# Patient Record
Sex: Male | Born: 1985 | Race: Black or African American | Hispanic: No | Marital: Single | Smoking: Former smoker
Health system: Southern US, Community
[De-identification: ages and names within clinical notes are randomized; demographics above are authoritative.]

## PROBLEM LIST (undated history)

## (undated) DIAGNOSIS — F419 Anxiety disorder, unspecified: Secondary | ICD-10-CM

## (undated) DIAGNOSIS — I10 Essential (primary) hypertension: Secondary | ICD-10-CM

## (undated) HISTORY — PX: APPENDECTOMY: SHX54

---
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
## 2009-03-29 ENCOUNTER — Emergency Department (HOSPITAL_COMMUNITY): Admission: EM | Admit: 2009-03-29 | Discharge: 2009-03-29 | Payer: Self-pay | Admitting: Family Medicine

## 2009-11-09 ENCOUNTER — Emergency Department (HOSPITAL_COMMUNITY): Admission: EM | Admit: 2009-11-09 | Discharge: 2009-11-09 | Payer: Self-pay | Admitting: Emergency Medicine

## 2009-12-12 ENCOUNTER — Ambulatory Visit: Payer: Self-pay | Admitting: Psychology

## 2010-01-22 ENCOUNTER — Ambulatory Visit: Payer: Self-pay | Admitting: Psychology

## 2010-01-25 ENCOUNTER — Inpatient Hospital Stay (HOSPITAL_COMMUNITY): Admission: EM | Admit: 2010-01-25 | Discharge: 2010-01-26 | Payer: Self-pay | Admitting: Emergency Medicine

## 2010-01-26 ENCOUNTER — Encounter (INDEPENDENT_AMBULATORY_CARE_PROVIDER_SITE_OTHER): Payer: Self-pay | Admitting: Internal Medicine

## 2010-01-27 ENCOUNTER — Telehealth (INDEPENDENT_AMBULATORY_CARE_PROVIDER_SITE_OTHER): Payer: Self-pay | Admitting: *Deleted

## 2010-01-27 ENCOUNTER — Ambulatory Visit: Payer: Self-pay | Admitting: Psychology

## 2010-01-28 ENCOUNTER — Ambulatory Visit: Payer: Self-pay | Admitting: Internal Medicine

## 2010-01-30 ENCOUNTER — Ambulatory Visit: Payer: Self-pay | Admitting: Internal Medicine

## 2010-01-30 ENCOUNTER — Ambulatory Visit: Payer: Self-pay

## 2010-02-17 ENCOUNTER — Emergency Department (HOSPITAL_COMMUNITY)
Admission: EM | Admit: 2010-02-17 | Discharge: 2010-02-17 | Payer: Self-pay | Source: Home / Self Care | Admitting: Emergency Medicine

## 2010-02-20 ENCOUNTER — Ambulatory Visit: Payer: Self-pay | Admitting: Psychology

## 2010-02-23 ENCOUNTER — Ambulatory Visit (HOSPITAL_COMMUNITY)
Admission: RE | Admit: 2010-02-23 | Discharge: 2010-02-23 | Payer: Self-pay | Source: Home / Self Care | Admitting: Internal Medicine

## 2010-03-18 ENCOUNTER — Ambulatory Visit: Payer: Self-pay | Admitting: Psychology

## 2010-04-16 ENCOUNTER — Ambulatory Visit
Admission: RE | Admit: 2010-04-16 | Discharge: 2010-04-16 | Payer: Self-pay | Source: Home / Self Care | Attending: Psychology | Admitting: Psychology

## 2010-04-23 NOTE — Procedures (Signed)
Summary: Summary Report  Summary Report   Imported By: Erle Crocker 02/11/2010 16:00:26  _____________________________________________________________________  External Attachment:    Type:   Image     Comment:   External Document

## 2010-04-23 NOTE — Progress Notes (Signed)
Summary: 24 holter monitor  Phone Note Outgoing Call Call back at [facility] Phone [phone]   Call placed by: Stanton Kidney, EMT-P,  January 27, 2010 3:08 PM Call placed to: Patient Action Taken: Phone Call Completed, Appt scheduled Summary of Call: S/W Patient, 24 holter monitor scheduled 01/28/10 at 3:30. Stanton Kidney, EMT-P  January 27, 2010 3:08 PM

## 2010-06-02 LAB — DIFFERENTIAL
Basophils Relative: 0 % (ref 0–1)
Eosinophils Absolute: 0.2 10*3/uL (ref 0.0–0.7)
Eosinophils Relative: 5 % (ref 0–5)
Monocytes Absolute: 0.7 10*3/uL (ref 0.1–1.0)
Monocytes Absolute: 0.8 10*3/uL (ref 0.1–1.0)
Monocytes Relative: 10 % (ref 3–12)
Neutro Abs: 4.1 10*3/uL (ref 1.7–7.7)
Neutro Abs: 4.3 10*3/uL (ref 1.7–7.7)
Neutrophils Relative %: 55 % (ref 43–77)

## 2010-06-02 LAB — URINALYSIS, ROUTINE W REFLEX MICROSCOPIC
Glucose, UA: NEGATIVE mg/dL
Ketones, ur: 15 mg/dL — AB
Urobilinogen, UA: 1 mg/dL (ref 0.0–1.0)
pH: 6.5 (ref 5.0–8.0)

## 2010-06-02 LAB — COMPREHENSIVE METABOLIC PANEL
ALT: 22 U/L (ref 0–53)
Albumin: 3.4 g/dL — ABNORMAL LOW (ref 3.5–5.2)
Albumin: 4.1 g/dL (ref 3.5–5.2)
Alkaline Phosphatase: 46 U/L (ref 39–117)
BUN: 7 mg/dL (ref 6–23)
CO2: 27 mEq/L (ref 19–32)
CO2: 28 mEq/L (ref 19–32)
Calcium: 10.2 mg/dL (ref 8.4–10.5)
Calcium: 9.4 mg/dL (ref 8.4–10.5)
Chloride: 105 mEq/L (ref 96–112)
GFR calc Af Amer: >60 mL/min (ref >60)
GFR calc Af Amer: >60 mL/min (ref >60)
GFR calc non Af Amer: >60 mL/min (ref >60)
Glucose, Bld: 111 mg/dL — ABNORMAL HIGH (ref 70–99)
Glucose, Bld: 96 mg/dL (ref 70–99)
Potassium: 4.1 mEq/L (ref 3.5–5.1)
Sodium: 142 mEq/L (ref 135–145)
Total Bilirubin: 0.6 mg/dL (ref 0.3–1.2)

## 2010-06-02 LAB — CBC
HCT: 46.2 % (ref 39.0–52.0)
HCT: 47.1 % (ref 39.0–52.0)
Hemoglobin: 15.7 g/dL (ref 13.0–17.0)
MCH: 28.8 pg (ref 26.0–34.0)
MCH: 29 pg (ref 26.0–34.0)
MCHC: 34.1 g/dL (ref 30.0–36.0)
MCHC: 34.6 g/dL (ref 30.0–36.0)
RBC: 5.41 MIL/uL (ref 4.22–5.81)
RDW: 12.9 % (ref 11.5–15.5)
RDW: 12.9 % (ref 11.5–15.5)
WBC: 7.9 10*3/uL (ref 4.0–10.5)

## 2010-06-02 LAB — BASIC METABOLIC PANEL
BUN: 6 mg/dL (ref 6–23)
CO2: 26 mEq/L (ref 19–32)
Calcium: 10 mg/dL (ref 8.4–10.5)
GFR calc Af Amer: >60 mL/min (ref >60)
GFR calc non Af Amer: >60 mL/min (ref >60)
Potassium: 4 mEq/L (ref 3.5–5.1)
Sodium: 139 mEq/L (ref 135–145)

## 2010-06-02 LAB — RAPID URINE DRUG SCREEN, HOSP PERFORMED
Amphetamines: NOT DETECTED
Barbiturates: NOT DETECTED
Benzodiazepines: NOT DETECTED
Cocaine: NOT DETECTED

## 2010-06-02 LAB — CK TOTAL AND CKMB (NOT AT ARMC)
CK, MB: 1.9 ng/mL (ref 0.3–4.0)
Relative Index: 0.5 (ref 0.0–2.5)
Relative Index: 0.5 (ref 0.0–2.5)
Relative Index: 0.5 (ref 0.0–2.5)
Total CK: 206 U/L (ref 7–232)
Total CK: 313 U/L — ABNORMAL HIGH (ref 7–232)
Total CK: 372 U/L — ABNORMAL HIGH (ref 7–232)

## 2010-06-02 LAB — TROPONIN I: Troponin I: 0.02 ng/mL (ref 0.00–0.06)

## 2010-06-02 LAB — PROTIME-INR: INR: 0.99 (ref 0.00–1.49)

## 2010-06-02 LAB — APTT: aPTT: 32 seconds (ref 24–37)

## 2010-06-30 ENCOUNTER — Ambulatory Visit (INDEPENDENT_AMBULATORY_CARE_PROVIDER_SITE_OTHER): Payer: 59 | Admitting: Psychology

## 2010-06-30 DIAGNOSIS — F411 Generalized anxiety disorder: Secondary | ICD-10-CM

## 2010-06-30 DIAGNOSIS — F331 Major depressive disorder, recurrent, moderate: Secondary | ICD-10-CM

## 2010-08-25 ENCOUNTER — Ambulatory Visit (INDEPENDENT_AMBULATORY_CARE_PROVIDER_SITE_OTHER): Payer: 59 | Admitting: Psychology

## 2010-08-25 DIAGNOSIS — F411 Generalized anxiety disorder: Secondary | ICD-10-CM

## 2010-08-25 DIAGNOSIS — F331 Major depressive disorder, recurrent, moderate: Secondary | ICD-10-CM

## 2010-10-19 ENCOUNTER — Ambulatory Visit (INDEPENDENT_AMBULATORY_CARE_PROVIDER_SITE_OTHER): Payer: 59 | Admitting: Psychology

## 2010-10-19 DIAGNOSIS — F331 Major depressive disorder, recurrent, moderate: Secondary | ICD-10-CM

## 2010-10-19 DIAGNOSIS — F411 Generalized anxiety disorder: Secondary | ICD-10-CM

## 2010-11-26 ENCOUNTER — Ambulatory Visit: Payer: 59 | Admitting: Psychology

## 2011-01-12 ENCOUNTER — Ambulatory Visit (INDEPENDENT_AMBULATORY_CARE_PROVIDER_SITE_OTHER): Payer: 59 | Admitting: Psychology

## 2011-01-12 DIAGNOSIS — F331 Major depressive disorder, recurrent, moderate: Secondary | ICD-10-CM

## 2011-01-12 DIAGNOSIS — F411 Generalized anxiety disorder: Secondary | ICD-10-CM

## 2011-06-24 ENCOUNTER — Encounter (HOSPITAL_COMMUNITY): Payer: Self-pay | Admitting: Emergency Medicine

## 2011-06-24 ENCOUNTER — Other Ambulatory Visit: Payer: Self-pay

## 2011-06-24 ENCOUNTER — Emergency Department (HOSPITAL_COMMUNITY): Payer: Self-pay

## 2011-06-24 ENCOUNTER — Emergency Department (HOSPITAL_COMMUNITY)
Admission: EM | Admit: 2011-06-24 | Discharge: 2011-06-24 | Disposition: A | Discharge status: Home / Self Care | Payer: Self-pay | Attending: Emergency Medicine | Admitting: Emergency Medicine

## 2011-06-24 DIAGNOSIS — R51 Headache: Secondary | ICD-10-CM

## 2011-06-24 DIAGNOSIS — K0381 Cracked tooth: Secondary | ICD-10-CM | POA: Insufficient documentation

## 2011-06-24 DIAGNOSIS — R079 Chest pain, unspecified: Secondary | ICD-10-CM | POA: Insufficient documentation

## 2011-06-24 LAB — POCT I-STAT TROPONIN I: Troponin i, poc: 0.01 ng/mL (ref 0.00–0.08)

## 2011-06-24 MED ORDER — OXYCODONE-ACETAMINOPHEN 5-325 MG PO TABS: 1.0000 | ORAL_TABLET | ORAL | Status: AC | PRN

## 2011-06-24 NOTE — ED Provider Notes (Signed)
History    26 year old male with chest pain. Substernal. Relatively constant for the past month. Gradual onset. No appreciable exacerbating relieving factors. Denies or vomiting. Patient blood clot. No palpitations. No cough. Patient is also complaining of a headache. A retro-orbital behind his left eye. Denies trauma. Pain is constant. No change in his visual acuity. Denies drainage. Does not wear corrective lenses. CSN: [id number]  Arrival date & time 06/24/11  0203   None     Chief Complaint  Patient presents with  . Chest Pain    (Consider location/radiation/quality/duration/timing/severity/associated sxs/prior treatment) HPI  History reviewed. No pertinent past medical history.  Past Surgical History  Procedure Date  . Appendectomy     No family history on file.  History  Substance Use Topics  . Smoking status: Current Everyday Smoker  . Smokeless tobacco: Not on file  . Alcohol Use: Yes      Review of Systems   Review of symptoms negative unless otherwise noted in HPI.   Allergies  Review of patient's allergies indicates no known allergies.  Home Medications   Current Outpatient Rx  Name Route Sig Dispense Refill  . NAPROXEN SODIUM 220 MG PO TABS Oral Take 220 mg by mouth 2 (two) times daily as needed. For pain      BP 149/91  Temp(Src) 98.2 F (36.8 C) (Oral)  Resp 18  SpO2 98%  Physical Exam  Nursing note and vitals reviewed. Constitutional: He appears well-developed and well-nourished. No distress.  HENT:  Head: Normocephalic and atraumatic.       Left upper premolar is cracked. There is no evidence of discrete drainable collection.  Eyes: Conjunctivae are normal. Pupils are equal, round, and reactive to light. Right eye exhibits no discharge. Left eye exhibits no discharge. No scleral icterus.       Pupils are equally round reactive. Corneas are clear. Conjunctiva aren't injected.periorbital tissues are unremarkable. Lids and lashes are normal.  Extraocular muscles are intact.  Neck: Neck supple.  Cardiovascular: Normal rate, regular rhythm and normal heart sounds.  Exam reveals no gallop and no friction rub.   No murmur heard. Pulmonary/Chest: Effort normal and breath sounds normal. No respiratory distress. He exhibits no tenderness.  Abdominal: Soft. He exhibits no distension. There is no tenderness.  Musculoskeletal: He exhibits no edema and no tenderness.       IE symmetric as compared to each other. There is no calf tenderness. Negative Homans. No edema.  Neurological: He is alert.  Skin: Skin is warm and dry.  Psychiatric: He has a normal mood and affect. His behavior is normal. Thought content normal.    ED Course  Procedures (including critical care time)  Labs Reviewed - No data to display Dg Chest 2 View  06/24/2011  *RADIOLOGY REPORT*  Clinical Data: Mid chest pain  CHEST - 2 VIEW  Comparison: 01/25/2010  Findings: Shallow inspiration.  Normal heart size and pulmonary vascularity.  Vague focal increased density in the right upper lung is probably accounted for by the first costochondral junction.  No focal airspace consolidation in the lungs.  No blunting of costophrenic angles.  No pneumothorax.  No significant change since the previous study.  IMPRESSION: No evidence of active pulmonary disease.  Original Report Authenticated By: Marlon Pel, M.D.   EKG:  Rhythm: Normal sinus rhythm with occasional PVCs Rate: 85 Axis: Left axis deviation Intervals: Nonspecific interventricular delay. QRS is 108 ms. ST segments: Patient has T wave inversions in lateral and inferior leads  with some slight depression inferiorly. Comparison: T. wave abnormalities are noted on previous EKG from 02/17/2010. Little change.   1. Chest pain   2. Headache       MDM  26 year old male with chest pain. Patient has a abnormal EKG but very similar in morphology from previous EKG in 2011. This does not appear to be any acute changes.  He is also complaining of a retro-orbital headache behind his left eye. This may be related to a cracked tooth, left upper premolar. No visual complaints. Ocular exam is unremarkable. Also consider possible cluster headaches. Doubt emergency secondary cause of headache such as acute angle closure glaucoma, infection, bleed or mass. Patient has a nonfocal neurological examination. He is somewhat hypertensive for his age but he has been told this in the past. Understands the need for followup. Return precautions were discussed.        Raeford Razor, MD 06/29/11 2306

## 2011-06-24 NOTE — Discharge Instructions (Signed)
Chest Pain (Nonspecific) It is often hard to give a specific diagnosis for the cause of chest pain. There is always a chance that your pain could be related to something serious, such as a heart attack or a blood clot in the lungs. You need to follow up with your caregiver for further evaluation. CAUSES   Heartburn.   Pneumonia or bronchitis.   Anxiety or stress.   Inflammation around your heart (pericarditis) or lung (pleuritis or pleurisy).   A blood clot in the lung.   A collapsed lung (pneumothorax). It can develop suddenly on its own (spontaneous pneumothorax) or from injury (trauma) to the chest.   Shingles infection (herpes zoster virus).  The chest wall is composed of bones, muscles, and cartilage. Any of these can be the source of the pain.  The bones can be bruised by injury.   The muscles or cartilage can be strained by coughing or overwork.   The cartilage can be affected by inflammation and become sore (costochondritis).  DIAGNOSIS  Lab tests or other studies, such as X-rays, electrocardiography, stress testing, or cardiac imaging, may be needed to find the cause of your pain.  TREATMENT   Treatment depends on what may be causing your chest pain. Treatment may include:   Acid blockers for heartburn.   Anti-inflammatory medicine.   Pain medicine for inflammatory conditions.   Antibiotics if an infection is present.   You may be advised to change lifestyle habits. This includes stopping smoking and avoiding alcohol, caffeine, and chocolate.   You may be advised to keep your head raised (elevated) when sleeping. This reduces the chance of acid going backward from your stomach into your esophagus.   Most of the time, nonspecific chest pain will improve within 2 to 3 days with rest and mild pain medicine.  HOME CARE INSTRUCTIONS   If antibiotics were prescribed, take your antibiotics as directed. Finish them even if you start to feel better.   For the next few  days, avoid physical activities that bring on chest pain. Continue physical activities as directed.   Do not smoke.   Avoid drinking alcohol.   Only take over-the-counter or prescription medicine for pain, discomfort, or fever as directed by your caregiver.   Follow your caregiver's suggestions for further testing if your chest pain does not go away.   Keep any follow-up appointments you made. If you do not go to an appointment, you could develop lasting (chronic) problems with pain. If there is any problem keeping an appointment, you must call to reschedule.  SEEK MEDICAL CARE IF:   You think you are having problems from the medicine you are taking. Read your medicine instructions carefully.   Your chest pain does not go away, even after treatment.   You develop a rash with blisters on your chest.  SEEK IMMEDIATE MEDICAL CARE IF:   You have increased chest pain or pain that spreads to your arm, neck, jaw, back, or abdomen.   You develop shortness of breath, an increasing cough, or you are coughing up blood.   You have severe back or abdominal pain, feel nauseous, or vomit.   You develop severe weakness, fainting, or chills.   You have a fever.  THIS IS AN EMERGENCY. Do not wait to see if the pain will go away. Get medical help at once. Call your local emergency services (911 in [place] Do not drive yourself to the hospital. MAKE SURE YOU:   Understand these instructions.     Will watch your condition.   Will get help right away if you are not doing well or get worse.  Document Released: 12/16/2004 Document Revised: 02/25/2011 Document Reviewed: 10/12/2007 [facility] Patient Information 2012 [place], [place].Headache Headaches are caused by many different problems. Most commonly, headache is caused by muscle tension from an injury, fatigue, or emotional upset. Excessive muscle contractions in the scalp and neck result in a headache that often feels like a tight band around the head.  Tension headaches often have areas of tenderness over the scalp and the back of the neck. These headaches may last for hours, days, or longer, and some may contribute to migraines in those who have migraine problems. Migraines usually cause a throbbing headache, which is made worse by activity. Sometimes only one side of the head hurts. Nausea, vomiting, eye pain, and avoidance of food are common with migraines. Visual symptoms such as light sensitivity, blind spots, or flashing lights may also occur. Loud noises may worsen migraine headaches. Many factors may cause migraine headaches:  Emotional stress, lack of sleep, and menstrual periods.   Alcohol and some drugs (such as birth control pills).   Diet factors (fasting, caffeine, food preservatives, chocolate).   Environmental factors (weather changes, bright lights, odors, smoke).  Other causes of headaches include minor injuries to the head. Arthritis in the neck; problems with the jaw, eyes, ears, or nose are also causes of headaches. Allergies, drugs, alcohol, and exposure to smoke can also cause moderate headaches. Rebound headaches can occur if someone uses pain medications for a long period of time and then stops. Less commonly, blood vessel problems in the neck and brain (including stroke) can cause various types of headache. Treatment of headaches includes medicines for pain and relaxation. Ice packs or heat applied to the back of the head and neck help some people. Massaging the shoulders, neck and scalp are often very useful. Relaxation techniques and stretching can help prevent these headaches. Avoid alcohol and cigarette smoking as these tend to make headaches worse. Please see your caregiver if your headache is not better in 2 days.  SEEK IMMEDIATE MEDICAL CARE IF:   You develop a high fever, chills, or repeated vomiting.   You faint or have difficulty with vision.   You develop unusual numbness or weakness of your arms or legs.    Relief of pain is inadequate with medication, or you develop severe pain.   You develop confusion, or neck stiffness.   You have a worsening of a headache or do not obtain relief.  Document Released: 03/08/2005 Document Revised: 02/25/2011 Document Reviewed: 09/01/2006 [facility] Patient Information 2012 [place], [place].Headaches, Frequently Asked Questions MIGRAINE HEADACHES Q: What is migraine? What causes it? How can I treat it? A: Generally, migraine headaches begin as a dull ache. Then they develop into a constant, throbbing, and pulsating pain. You may experience pain at the temples. You may experience pain at the front or back of one or both sides of the head. The pain is usually accompanied by a combination of:  Nausea.   Vomiting.   Sensitivity to light and noise.  Some people (about 15%) experience an aura (see below) before an attack. The cause of migraine is believed to be chemical reactions in the brain. Treatment for migraine may include over-the-counter or prescription medications. It may also include self-help techniques. These include relaxation training and biofeedback.  Q: What is an aura? A: About 15% of people with migraine get an "aura". This is a sign  of neurological symptoms that occur before a migraine headache. You may see wavy or jagged lines, dots, or flashing lights. You might experience tunnel vision or blind spots in one or both eyes. The aura can include visual or auditory hallucinations (something imagined). It may include disruptions in smell (such as strange odors), taste or touch. Other symptoms include:  Numbness.   A "pins and needles" sensation.   Difficulty in recalling or speaking the correct word.  These neurological events may last as long as 60 minutes. These symptoms will fade as the headache begins. Q: What is a trigger? A: Certain physical or environmental factors can lead to or "trigger" a migraine. These include:  Foods.   Hormonal  changes.   Weather.   Stress.  It is important to remember that triggers are different for everyone. To help prevent migraine attacks, you need to figure out which triggers affect you. Keep a headache diary. This is a good way to track triggers. The diary will help you talk to your healthcare professional about your condition. Q: Does weather affect migraines? A: Bright sunshine, hot, humid conditions, and drastic changes in barometric pressure may lead to, or "trigger," a migraine attack in some people. But studies have shown that weather does not act as a trigger for everyone with migraines. Q: What is the link between migraine and hormones? A: Hormones start and regulate many of your body's functions. Hormones keep your body in balance within a constantly changing environment. The levels of hormones in your body are unbalanced at times. Examples are during menstruation, pregnancy, or menopause. That can lead to a migraine attack. In fact, about three quarters of all women with migraine report that their attacks are related to the menstrual cycle.  Q: Is there an increased risk of stroke for migraine sufferers? A: The likelihood of a migraine attack causing a stroke is very remote. That is not to say that migraine sufferers cannot have a stroke associated with their migraines. In persons under age 45, the most common associated factor for stroke is migraine headache. But over the course of a person's normal life span, the occurrence of migraine headache may actually be associated with a reduced risk of dying from cerebrovascular disease due to stroke.  Q: What are acute medications for migraine? A: Acute medications are used to treat the pain of the headache after it has started. Examples over-the-counter medications, NSAIDs, ergots, and triptans.  Q: What are the triptans? A: Triptans are the newest class of abortive medications. They are specifically targeted to treat migraine. Triptans are  vasoconstrictors. They moderate some chemical reactions in the brain. The triptans work on receptors in your brain. Triptans help to restore the balance of a neurotransmitter called serotonin. Fluctuations in levels of serotonin are thought to be a main cause of migraine.  Q: Are over-the-counter medications for migraine effective? A: Over-the-counter, or "OTC," medications may be effective in relieving mild to moderate pain and associated symptoms of migraine. But you should see your caregiver before beginning any treatment regimen for migraine.  Q: What are preventive medications for migraine? A: Preventive medications for migraine are sometimes referred to as "prophylactic" treatments. They are used to reduce the frequency, severity, and length of migraine attacks. Examples of preventive medications include antiepileptic medications, antidepressants, beta-blockers, calcium channel blockers, and NSAIDs (nonsteroidal anti-inflammatory drugs). Q: Why are anticonvulsants used to treat migraine? A: During the past few years, there has been an increased interest in antiepileptic drugs for the  prevention of migraine. They are sometimes referred to as "anticonvulsants". Both epilepsy and migraine may be caused by similar reactions in the brain.  Q: Why are antidepressants used to treat migraine? A: Antidepressants are typically used to treat people with depression. They may reduce migraine frequency by regulating chemical levels, such as serotonin, in the brain.  Q: What alternative therapies are used to treat migraine? A: The term "alternative therapies" is often used to describe treatments considered outside the scope of conventional Western medicine. Examples of alternative therapy include acupuncture, acupressure, and yoga. Another common alternative treatment is herbal therapy. Some herbs are believed to relieve headache pain. Always discuss alternative therapies with your caregiver before proceeding. Some  herbal products contain arsenic and other toxins. TENSION HEADACHES Q: What is a tension-type headache? What causes it? How can I treat it? A: Tension-type headaches occur randomly. They are often the result of temporary stress, anxiety, fatigue, or anger. Symptoms include soreness in your temples, a tightening band-like sensation around your head (a "vice-like" ache). Symptoms can also include a pulling feeling, pressure sensations, and contracting head and neck muscles. The headache begins in your forehead, temples, or the back of your head and neck. Treatment for tension-type headache may include over-the-counter or prescription medications. Treatment may also include self-help techniques such as relaxation training and biofeedback. CLUSTER HEADACHES Q: What is a cluster headache? What causes it? How can I treat it? A: Cluster headache gets its name because the attacks come in groups. The pain arrives with little, if any, warning. It is usually on one side of the head. A tearing or bloodshot eye and a runny nose on the same side of the headache may also accompany the pain. Cluster headaches are believed to be caused by chemical reactions in the brain. They have been described as the most severe and intense of any headache type. Treatment for cluster headache includes prescription medication and oxygen. SINUS HEADACHES Q: What is a sinus headache? What causes it? How can I treat it? A: When a cavity in the bones of the face and skull (a sinus) becomes inflamed, the inflammation will cause localized pain. This condition is usually the result of an allergic reaction, a tumor, or an infection. If your headache is caused by a sinus blockage, such as an infection, you will probably have a fever. An x-ray will confirm a sinus blockage. Your caregiver's treatment might include antibiotics for the infection, as well as antihistamines or decongestants.  REBOUND HEADACHES Q: What is a rebound headache? What  causes it? How can I treat it? A: A pattern of taking acute headache medications too often can lead to a condition known as "rebound headache." A pattern of taking too much headache medication includes taking it more than 2 days per week or in excessive amounts. That means more than the label or a caregiver advises. With rebound headaches, your medications not only stop relieving pain, they actually begin to cause headaches. Doctors treat rebound headache by tapering the medication that is being overused. Sometimes your caregiver will gradually substitute a different type of treatment or medication. Stopping may be a challenge. Regularly overusing a medication increases the potential for serious side effects. Consult a caregiver if you regularly use headache medications more than 2 days per week or more than the label advises. ADDITIONAL QUESTIONS AND ANSWERS Q: What is biofeedback? A: Biofeedback is a self-help treatment. Biofeedback uses special equipment to monitor your body's involuntary physical responses. Biofeedback monitors:  Breathing.  Pulse.   Heart rate.   Temperature.   Muscle tension.   Brain activity.  Biofeedback helps you refine and perfect your relaxation exercises. You learn to control the physical responses that are related to stress. Once the technique has been mastered, you do not need the equipment any more. Q: Are headaches hereditary? A: Four out of five (80%) of people that suffer report a family history of migraine. Scientists are not sure if this is genetic or a family predisposition. Despite the uncertainty, a child has a 50% chance of having migraine if one parent suffers. The child has a 75% chance if both parents suffer.  Q: Can children get headaches? A: By the time they reach high school, most young people have experienced some type of headache. Many safe and effective approaches or medications can prevent a headache from occurring or stop it after it has  begun.  Q: What type of doctor should I see to diagnose and treat my headache? A: Start with your primary caregiver. Discuss his or her experience and approach to headaches. Discuss methods of classification, diagnosis, and treatment. Your caregiver may decide to recommend you to a headache specialist, depending upon your symptoms or other physical conditions. Having diabetes, allergies, etc., may require a more comprehensive and inclusive approach to your headache. The National Headache Foundation will provide, upon request, a list of [facility] physician members in your state. Document Released: 05/29/2003 Document Revised: 02/25/2011 Document Reviewed: 11/06/2007 [facility] Patient Information 2012 [place], [place].  RESOURCE GUIDE  Dental Problems  Patients with Medicaid: [facility] [phone]                                           [phone] [address] Phone:  [phone]                                                  Phone:  [phone]  If unable to pay or uninsured, contact:  [facility] or [facility]. to become qualified for the adult dental clinic.  Chronic Pain Problems Contact [facility]  [phone] Patients need to be referred by their primary care doctor.  Insufficient Money for Medicine Contact [employer]:  call "211" or [facility] 224 841 4653.  No Primary Care Doctor Call [facility]  978-182-4107 Other agencies that provide inexpensive medical care    [facility]  4426672854    [facility]  [phone]    [facility]  [phone]    [facility]  [phone]    [facility]  240 301 1862    [facility]  [phone]  Psychological Services [facility]  ([phone] [facility]  [phone] [facility]   7141112966 (emergency services 431-724-9839)  Substance Abuse Resources Alcohol and Drug Services   680-339-8057 [facility] 254-420-1534 [facility] [place] [phone] Floydene Flock (873) 308-7374 Residential & Outpatient Substance Abuse Program  (207) 340-2363  Abuse/Neglect [facility] Child Abuse Hotline 949 525 2612 [facility] Child Abuse Hotline 917-591-0992 (After Hours)  Emergency Shelter [facility] (  [phone]  M[facility] Homes [facility] [place] [facility] (405)255-8394 [facility] [phone]  MRSA Hotline #:   7071333539    [facility] Resources  [facility]     [employer]                          [facility] Dept. [address] [place]                       [address]      [address]  Blondell Reveal Phone:  [phone]                                   Phone:  [phone]                 Phone:  [phone]  [facility] Phone:  [phone]  [facility] Child Abuse Hotline (786)641-9506 7620235326 (After Hours)

## 2011-06-24 NOTE — ED Notes (Signed)
PT. REPORTS MID CHEST PAIN FOR 1 MONTH , LEFT EYE PAIN THIS MORNING - DENIES INJURY, AND LEFT UPPER MOLAR PAIN ONSET LAST WEEK.

## 2011-08-09 ENCOUNTER — Ambulatory Visit: Payer: 59 | Admitting: Psychology

## 2011-10-12 ENCOUNTER — Ambulatory Visit (INDEPENDENT_AMBULATORY_CARE_PROVIDER_SITE_OTHER): Payer: 59 | Admitting: Psychology

## 2011-10-12 DIAGNOSIS — F411 Generalized anxiety disorder: Secondary | ICD-10-CM

## 2011-10-12 DIAGNOSIS — F331 Major depressive disorder, recurrent, moderate: Secondary | ICD-10-CM

## 2011-10-26 ENCOUNTER — Ambulatory Visit (INDEPENDENT_AMBULATORY_CARE_PROVIDER_SITE_OTHER): Payer: 59 | Admitting: Psychology

## 2011-10-26 DIAGNOSIS — F411 Generalized anxiety disorder: Secondary | ICD-10-CM

## 2011-10-26 DIAGNOSIS — F331 Major depressive disorder, recurrent, moderate: Secondary | ICD-10-CM

## 2016-07-13 ENCOUNTER — Other Ambulatory Visit: Payer: Self-pay

## 2016-07-13 ENCOUNTER — Encounter (HOSPITAL_COMMUNITY): Payer: Self-pay | Admitting: *Deleted

## 2016-07-13 ENCOUNTER — Emergency Department (HOSPITAL_COMMUNITY)
Admission: EM | Admit: 2016-07-13 | Discharge: 2016-07-13 | Disposition: A | Discharge status: Home / Self Care | Payer: 59 | Attending: Emergency Medicine | Admitting: Emergency Medicine

## 2016-07-13 ENCOUNTER — Emergency Department (HOSPITAL_COMMUNITY): Payer: 59

## 2016-07-13 DIAGNOSIS — R079 Chest pain, unspecified: Secondary | ICD-10-CM

## 2016-07-13 DIAGNOSIS — R42 Dizziness and giddiness: Secondary | ICD-10-CM | POA: Insufficient documentation

## 2016-07-13 DIAGNOSIS — F17200 Nicotine dependence, unspecified, uncomplicated: Secondary | ICD-10-CM | POA: Insufficient documentation

## 2016-07-13 DIAGNOSIS — R002 Palpitations: Secondary | ICD-10-CM

## 2016-07-13 DIAGNOSIS — I493 Ventricular premature depolarization: Secondary | ICD-10-CM | POA: Insufficient documentation

## 2016-07-13 HISTORY — DX: Anxiety disorder, unspecified: F41.9

## 2016-07-13 LAB — BASIC METABOLIC PANEL
Anion gap: 8 (ref 5–15)
BUN: 9 mg/dL (ref 6–20)
CHLORIDE: 107 mmol/L (ref 101–111)
CO2: 23 mmol/L (ref 22–32)
CREATININE: 1.12 mg/dL (ref 0.61–1.24)
Calcium: 9.7 mg/dL (ref 8.9–10.3)
GFR calc Af Amer: >60 mL/min (ref >60)
GFR calc non Af Amer: >60 mL/min (ref >60)
Glucose, Bld: 94 mg/dL (ref 65–99)
Potassium: 4.2 mmol/L (ref 3.5–5.1)
Sodium: 138 mmol/L (ref 135–145)

## 2016-07-13 LAB — CBC
HEMATOCRIT: 43.7 % (ref 39.0–52.0)
HEMOGLOBIN: 15 g/dL (ref 13.0–17.0)
MCH: 29.1 pg (ref 26.0–34.0)
MCHC: 34.3 g/dL (ref 30.0–36.0)
MCV: 84.9 fL (ref 78.0–100.0)
Platelets: 254 10*3/uL (ref 150–400)
RBC: 5.15 MIL/uL (ref 4.22–5.81)
RDW: 13.2 % (ref 11.5–15.5)
WBC: 7.2 10*3/uL (ref 4.0–10.5)

## 2016-07-13 LAB — I-STAT TROPONIN, ED
TROPONIN I, POC: 0 ng/mL (ref 0.00–0.08)
Troponin i, poc: 0 ng/mL (ref 0.00–0.08)

## 2016-07-13 NOTE — ED Provider Notes (Signed)
[facility]-EMERGENCY DEPT Provider Note   CSN: [id number] Arrival date & time: 07/13/16  1154     History   Chief Complaint Chief Complaint  Patient presents with  . Chest Pain    HPI Dale Fitzgerald is a 31 y.o. male.  The history is provided by the patient.  Chest Pain   This is a new problem. The current episode started 3 to 5 hours ago. The problem occurs constantly. The problem has been resolved. Associated with: Pt was packing boxes. The pain is present in the substernal region. The pain is at a severity of 6/10. The pain is moderate. The quality of the pain is described as pressure-like and sharp. The pain radiates to the upper back. Exacerbated by: Nothing. Associated symptoms include back pain, irregular heartbeat and palpitations. Pertinent negatives include no abdominal pain, no claudication, no cough, no diaphoresis, no dizziness, no exertional chest pressure, no fever, no headaches, no hemoptysis, no leg pain, no lower extremity edema, no malaise/fatigue, no nausea, no numbness, no orthopnea, no PND, no shortness of breath, no sputum production, no syncope, no vomiting and no weakness. He has tried rest, nitroglycerin and oxygen for the symptoms. The treatment provided significant relief. Risk factors include male gender.  Pertinent negatives for past medical history include no CAD, no PE and no seizures.  Pertinent negatives for family medical history include: no CAD.    Past Medical History:  Diagnosis Date  . Anxiety     There are no active problems to display for this patient.   Past Surgical History:  Procedure Laterality Date  . APPENDECTOMY         Home Medications    Prior to Admission medications   Medication Sig Start Date End Date Taking? Authorizing Provider  naproxen sodium (ANAPROX) 220 MG tablet Take 220 mg by mouth 2 (two) times daily as needed. For pain    Historical Provider, MD    Family History No family history on file.  Social  History Social History  Substance Use Topics  . Smoking status: Current Every Day Smoker  . Smokeless tobacco: Not on file  . Alcohol use Yes     Allergies   Patient has no known allergies.   Review of Systems Review of Systems  Constitutional: Negative for chills, diaphoresis, fever and malaise/fatigue.  HENT: Negative for ear pain and sore throat.   Eyes: Negative for pain and visual disturbance.  Respiratory: Negative for cough, hemoptysis, sputum production and shortness of breath.   Cardiovascular: Positive for chest pain and palpitations. Negative for orthopnea, claudication, syncope and PND.  Gastrointestinal: Negative for abdominal pain, nausea and vomiting.  Genitourinary: Negative for dysuria and hematuria.  Musculoskeletal: Positive for back pain. Negative for arthralgias.  Skin: Negative for color change and rash.  Neurological: Positive for light-headedness. Negative for dizziness, seizures, syncope, weakness, numbness and headaches.  All other systems reviewed and are negative.    Physical Exam Updated Vital Signs BP 132/74 (BP Location: Right Arm)   Pulse 82   Temp 98.4 F (36.9 C) (Oral)   Resp 16   SpO2 98%   Physical Exam  Constitutional: He appears well-developed and well-nourished.  HENT:  Head: Normocephalic and atraumatic.  Eyes: Conjunctivae and EOM are normal.  Neck: Neck supple.  Cardiovascular: Normal rate and regular rhythm.   No murmur heard. Pulmonary/Chest: Effort normal and breath sounds normal. No respiratory distress.  Abdominal: Soft. There is no tenderness.  Musculoskeletal: He exhibits no edema.  Neurological:  He is alert.  Skin: Skin is warm and dry.     Psychiatric: He has a normal mood and affect.  Nursing note and vitals reviewed.    ED Treatments / Results  Labs (all labs ordered are listed, but only abnormal results are displayed) Labs Reviewed  BASIC METABOLIC PANEL  CBC  I-STAT TROPOININ, ED  I-STAT  TROPOININ, ED    EKG  EKG Interpretation  Date/Time:  Tuesday July 13 2016 11:58:01 EDT Ventricular Rate:  70 PR Interval:  140 QRS Duration: 104 QT Interval:  362 QTC Calculation: 390 R Axis:   -46 Text Interpretation:  Sinus rhythm with marked sinus arrhythmia with frequent Premature ventricular complexes Incomplete right bundle branch block Left anterior fascicular block Nonspecific ST and T wave abnormality Abnormal ECG No significant change since last tracing Confirmed by [facility] MD, Barbara Cower [phone]) on 07/13/2016 12:01:33 PM       Radiology Dg Chest 2 View  Result Date: 07/13/2016 CLINICAL DATA:  Lambert Mody back pain radiating to chest EXAM: CHEST  2 VIEW COMPARISON:  06/24/2011 FINDINGS: Heart and mediastinal contours are within normal limits. No focal opacities or effusions. No acute bony abnormality. IMPRESSION: No active cardiopulmonary disease. Electronically Signed   By: Charlett Nose M.D.   On: 07/13/2016 12:39    Procedures Procedures (including critical care time)  Medications Ordered in ED Medications - No data to display   Initial Impression / Assessment and Plan / ED Course  I have reviewed the triage vital signs and the nursing notes.  Pertinent labs & imaging results that were available during my care of the patient were reviewed by me and considered in my medical decision making (see chart for details).     31 year old black male with history of previous noted PVCs presents in the setting of chest pain and lightheadedness. Patient reports he was backing boxes at work when he had chest pressure and lightheadedness. EMS was called and patient given aspirin and nitroglycerin with improvement in symptoms.  On arrival in emergency department patient was hemodynamically stable and afebrile. Patient had complete resolution of pain at that time. Laboratory analysis without electrolyte abnormalities or anemia. Initial troponin was negative. Patient is low risk for heart  score. EKG revealed complete right bundle branch block which was noted on previous EKG. Patient with PVC on EKG. Patient reports he did fill palpitations during event. Patient is PERC negative and I have low suspicion for pulmonary embolus. Chest x-ray without signs of pneumonia or widening of mediastinum or other significant abnormality. Symptoms likely related to frequent PVCs. Repeat troponin was negative and have low suspicion for ACS at this time.  We'll discharge home with plan to follow-up with cardiology for further management as condition as this is the second time and event like this has happened. Strict return precautions were given and patient stable at time of discharge. Patient in agreement with plan.  Final Clinical Impressions(s) / ED Diagnoses   Final diagnoses:  Chest pain, unspecified type  Lightheadedness  PVC (premature ventricular contraction)  Palpitations    New Prescriptions New Prescriptions   No medications on file     Stacy Gardner, MD 07/13/16 1719    Linwood Dibbles, MD 07/14/16 1452

## 2016-07-13 NOTE — ED Triage Notes (Signed)
Pt states he was at work when he began having back pain that radiated to the left chest. pt states that he also had dizziness and felt disoriented. Pt was brought in by EMS for eval . Pt received  of asprin and 2 nitro that decreased his pain from a 6/10 to 0/10.

## 2019-08-07 ENCOUNTER — Other Ambulatory Visit: Payer: Self-pay | Admitting: Physical Medicine and Rehabilitation

## 2019-08-07 DIAGNOSIS — M5410 Radiculopathy, site unspecified: Secondary | ICD-10-CM

## 2019-09-25 ENCOUNTER — Other Ambulatory Visit: Payer: 59

## 2019-11-21 ENCOUNTER — Other Ambulatory Visit: Payer: Self-pay | Admitting: Physical Medicine and Rehabilitation

## 2019-11-22 ENCOUNTER — Other Ambulatory Visit: Payer: 59

## 2020-02-03 ENCOUNTER — Ambulatory Visit (HOSPITAL_COMMUNITY)
Admission: EM | Admit: 2020-02-03 | Discharge: 2020-02-03 | Disposition: A | Discharge status: Home / Self Care | Payer: 59

## 2020-02-03 ENCOUNTER — Other Ambulatory Visit: Payer: Self-pay

## 2020-02-03 DIAGNOSIS — F332 Major depressive disorder, recurrent severe without psychotic features: Secondary | ICD-10-CM | POA: Diagnosis not present

## 2020-02-03 DIAGNOSIS — F325 Major depressive disorder, single episode, in full remission: Secondary | ICD-10-CM | POA: Insufficient documentation

## 2020-02-03 DIAGNOSIS — F32A Depression, unspecified: Secondary | ICD-10-CM | POA: Insufficient documentation

## 2020-02-03 NOTE — ED Provider Notes (Addendum)
Behavioral Health Urgent Care Medical Screening Exam  Patient Name: Dale Fitzgerald MRN: [id number] Date of Evaluation: 02/03/20 Chief Complaint:   Diagnosis:  Final diagnoses:  None    History of Present illness: Dale Fitzgerald is a 34 y.o. male presented to [facility] for suicidal ideations. Ja is accompanied by friends and family are waiting in the lobby.  Reports feeling stressed and overwhelmed on last night.  When he thought about "ending it all."  Patient reports access to guns however stated his friend [name] is current possession of a gun.  NP called to validate the guns locked in a safe.  [name] Siegert's phone #[phone].  Denied previous inpatient admissions.  Patient is discharged to the care of with [phone]. NP obtained additional collateral followed up with patient's brother who reports he is coming into town to be support until his girlfriend gets back into town on tomorrow.  Currently patient is denying suicidal or homicidal ideation.  States he has a lot to live for as he reports a 31 year old son.  Patient was receptive to attending partial hospitalization program.  Support, encouragement and reassurance was provided.  Psychiatric Specialty Exam  Presentation  General Appearance:Appropriate for Environment  Eye Contact:Good  Speech:Clear and Coherent  Speech Volume:Normal  Handedness:Right   Mood and Affect  Mood:Depressed  Affect:Congruent   Thought Process  Thought Processes:Coherent  Descriptions of Associations:Intact  Orientation:Full (Time, Place and Person)  Thought Content:Paranoid Ideation;Logical  Hallucinations:None  Ideas of Reference:None  Suicidal Thoughts:No (currently denying suicidal ideations)  Homicidal Thoughts:No   Sensorium  Memory:Immediate Good;Remote Good;Recent Good  Judgment:Good  Insight:Good   Executive Functions  Concentration:Good  Attention  Span:Good  Recall:Good  Fund of Knowledge:Fair  Language:Good   Psychomotor Activity  Psychomotor Activity:Normal   Assets  Assets:Desire for Improvement;Social Support   Sleep  Sleep:Good  Number of hours: 5   Physical Exam: Physical Exam Vitals reviewed.  Psychiatric:        Attention and Perception: Attention normal.        Mood and Affect: Mood normal.        Behavior: Behavior normal.        Thought Content: Thought content normal.        Cognition and Memory: Cognition normal.        Judgment: Judgment normal.    Review of Systems  Psychiatric/Behavioral: Positive for depression. Negative for suicidal ideas. The patient is not nervous/anxious.   All other systems reviewed and are negative.  Blood pressure (!) 159/117, pulse 98, temperature 97.7 F (36.5 C), temperature source Oral, resp. rate 18, SpO2 100 %. There is no height or weight on file to calculate BMI.  Musculoskeletal: Strength & Muscle Tone: within normal limits Gait & Station: normal Patient leans: N/A   [facility] MSE Discharge Disposition for Follow up and Recommendations: Based on my evaluation the patient does not appear to have an emergency medical condition and can be discharged with resources and follow up care in outpatient services for Partial Hospitalization Program and Group Therapy - Quick start to PHP  Oneta Rack, NP 02/03/2020, 4:45 PM

## 2020-02-03 NOTE — ED Notes (Signed)
Pt discharged with friend by pt side. Pt states, "I feel better now that I talked to someone. I just got scared because something like that has never happened to me before. My friend is going to stay with me for about a week and my brother is coming in tomorrow from [place]. I will be ok". Pt sent home with resources to follow up with outpatient services tomorrow. Safety maintained.

## 2020-02-03 NOTE — Discharge Instructions (Signed)
Take all medications as prescribed. Keep all follow-up appointments as scheduled.  Do not consume alcohol or use illegal drugs while on prescription medications. Report any adverse effects from your medications to your primary care provider promptly.  In the event of recurrent symptoms or worsening symptoms, call 911, a crisis hotline, or go to the nearest emergency department for evaluation.   

## 2020-02-03 NOTE — BH Assessment (Signed)
Comprehensive Clinical Assessment (CCA) Note  02/03/2020 Dale Fitzgerald [id number]  Dale Fitzgerald is a 34 year old male who presents to [facility] reportedly having suicidal ideations "last night" with a plan to shoot himself with a gun that was in his possession. He shared with this Clinical research associate that he called his brother (Dale: [phone]), his friend Dale Fitzgerald: [phone]), his girlfriend's mother, and his girlfriend to gain their support. He shared that he is no longer having thoughts to harm himself. He reports he called his friend Dale Fitzgerald) and asked his friend to come and pick up the gun to remove it from his possession - his friend agreed. Pt shared that his current stressors are having issues with his relationship with his girlfriend, his employer is mandating him to get a COVID vaccine, and missing his deceased parents around the holidays. Pt reports daily cannabis use and denies use of other alcohol/substances. Pt was alert and oriented x4 while pleasant in mood. Pt did not appear to be responding to internal stimuli. Pt was future oriented as he talked to this Clinical research associate. Pt kept repeating to this writer "I just needed someone to talk to".  Chief Complaint:  Chief Complaint  Patient presents with  . Suicidal  . Depression   Visit Diagnosis: Depression, unspecified   CCA Screening, Triage and Referral (STR)  Patient Reported Information How did you hear about [place]? Family/Friend  Referral name: Abbe Amsterdam  Referral phone number: [phone]   Whom do you see for routine medical problems? I don't have a doctor  Practice/Facility Name: No data recorded Practice/Facility Phone Number: No data recorded Name of Contact: No data recorded Contact Number: No data recorded Contact Fax Number: No data recorded Prescriber Name: No data recorded Prescriber Address (if known): No data recorded  What Is the Reason for Your Visit/Call Today? "I need to talk to someone"  How Long Has This Been  Causing You Problems? 1 wk - 1 month  What Do You Feel Would Help You the Most Today? Therapy   Have You Recently Been in Any Inpatient Treatment (Hospital/Detox/Crisis Center/28-Day Program)? No  Name/Location of Program/Hospital:No data recorded How Long Were You There? No data recorded When Were You Discharged? No data recorded  Have You Ever Received Services From [facility] Before? No  Who Do You See at [facility]? No data recorded  Have You Recently Had Any Thoughts About Hurting Yourself? Yes ("yesterday")  Are You Planning to Commit Suicide/Harm Yourself At This time? No   Have you Recently Had Thoughts About Hurting Someone Karolee Ohs? No  Explanation: No data recorded  Have You Used Any Alcohol or Drugs in the Past 24 Hours? Yes  How Long Ago Did You Use Drugs or Alcohol? 2000  What Did You Use and How Much? Weed - 1.5 grams   Do You Currently Have a Therapist/Psychiatrist? No  Name of Therapist/Psychiatrist: No data recorded  Have You Been Recently Discharged From Any Office Practice or Programs? No  Explanation of Discharge From Practice/Program: No data recorded    CCA Screening Triage Referral Assessment Type of Contact: Face-to-Face  Is this Initial or Reassessment? No data recorded Date Telepsych consult ordered in [facility]:  No data recorded Time Telepsych consult ordered in [facility]:  No data recorded  Patient Reported Information Reviewed? Yes  Patient Left Without Being Seen? No data recorded Reason for Not Completing Assessment: No data recorded  Collateral Involvement: Collateral information was obtained from pt's brother (Dale Fitzgerald) and friend Dale Fitzgerald).   Does  Patient Have a Automotive engineer Guardian? No data recorded Name and Contact of Legal Guardian: No data recorded If Minor and Not Living with Parent(s), Who has Custody? No data recorded Is CPS involved or ever been involved? Never  Is APS involved or ever been involved?  Never   Patient Determined To Be At Risk for Harm To Self or Others Based on Review of Patient Reported Information or Presenting Complaint? No  Method: No data recorded Availability of Means: No data recorded Intent: No data recorded Notification Required: No data recorded Additional Information for Danger to Others Potential: No data recorded Additional Comments for Danger to Others Potential: No data recorded Are There Guns or Other Weapons in Your Home? No data recorded Types of Guns/Weapons: No data recorded Are These Weapons Safely Secured?                            No data recorded Who Could Verify You Are Able To Have These Secured: No data recorded Do You Have any Outstanding Charges, Pending Court Dates, Parole/Probation? No data recorded Contacted To Inform of Risk of Harm To Self or Others: No data recorded  Location of Assessment: [facility] Services   Does Patient Present under Involuntary Commitment? No  IVC Papers Initial File Date: No data recorded  [place] of Residence: [place]   Patient Currently Receiving the Following Services: Not Receiving Services   Determination of Need: Emergent (2 hours)   Options For Referral: Outpatient Therapy     CCA Biopsychosocial Intake/Chief Complaint:  "suicidal thoughts last night"  Current Symptoms/Problems: "I feel alone and last night I was having bad thoughts of wanting to harm myself with a gun."   Patient Reported Schizophrenia/Schizoaffective Diagnosis in Past: No   Strengths: Good support system; Stable fulltime employment; Stable housing  Preferences: No data recorded Abilities: Ability to contact help/suppor when needed   Type of Services Patient Feels are Needed: Therapy   Initial Clinical Notes/Concerns: No data recorded  Mental Health Symptoms Depression:  Irritability;Change in energy/activity;Difficulty Concentrating;Worthlessness ("Asking myself what's my purpose in this  world.")   Duration of Depressive symptoms: Less than two weeks   Mania:  N/A   Anxiety:   N/A   Psychosis:  None   Duration of Psychotic symptoms: No data recorded  Trauma:  N/A   Obsessions:  N/A   Compulsions:  N/A   Inattention:  N/A   Hyperactivity/Impulsivity:  N/A   Oppositional/Defiant Behaviors:  N/A   Emotional Irregularity:  N/A   Other Mood/Personality Symptoms:  None    Mental Status Exam Appearance and self-care  Stature:  Tall   Weight:  Average weight   Clothing:  Age-appropriate   Grooming:  Normal   Cosmetic use:  None   Posture/gait:  Normal   Motor activity:  Not Remarkable   Sensorium  Attention:  Normal   Concentration:  Normal   Orientation:  X5   Recall/memory:  Normal   Affect and Mood  Affect:  Appropriate   Mood:  Other (Comment) (Pleasant)   Relating  Eye contact:  Normal   Facial expression:  Responsive   Attitude toward examiner:  Cooperative   Thought and Language  Speech flow: Clear and Coherent   Thought content:  Appropriate to Mood and Circumstances   Preoccupation:  None   Hallucinations:  None   Organization:  No data recorded  [employer] of Knowledge:  Good  Intelligence:  Average   Abstraction:  Normal   Judgement:  Normal   Reality Testing:  Adequate   Insight:  Lacking   Decision Making:  Normal   Social Functioning  Social Maturity:  Responsible   Social Judgement:  Normal   Stress  Stressors:  Grief/losses;Relationship;Transitions;Other (Comment);Work (Investment banker, operational by employer)   Coping Ability:  Human resources officer Deficits:  None   Supports:  Family;Friends/Service system     Religion: Religion/Spirituality Are You A Religious Person?: Yes What is Your Religious Affiliation?: Christian How Might This Affect Treatment?: None Reported  Leisure/Recreation:    Exercise/Diet:     CCA Employment/Education Employment/Work  Situation: Employment / Work Situation Employment situation: Employed Where is patient currently employed?: [employer] long has patient been employed?: 3 years Has patient ever been in the [employer]?: No  Education: Education Is Patient Currently Attending School?: No Did Designer, television/film set?: No Did You Have An Individualized Education Program (IIEP): No Did You Have Any Difficulty At [employer]?: No Patient's Education Has Been Impacted by Current Illness: No   CCA Family/Childhood History Family and Relationship History: Family history Marital status: Long term relationship Are you sexually active?: Yes What is your sexual orientation?: Heterosexual Does patient have children?: Yes How many children?: 1 How is patient's relationship with their children?: "Good"  Childhood History:     Child/Adolescent Assessment:     CCA Substance Use Alcohol/Drug Use: Alcohol / Drug Use Pain Medications: See MAR Prescriptions: See MAR Over the Counter: See MAR History of alcohol / drug use?: Yes Substance #1 Name of Substance 1: Cannabis 1 - Amount (size/oz): 1.5 grams 1 - Frequency: Daily 1 - Last Use / Amount: 02/02/2020           Substance use Disorder (SUD)    Recommendations for Services/Supports/Treatments: Recommendations for Services/Supports/Treatments Recommendations For Services/Supports/Treatments: Individual Therapy  DSM5 Diagnoses: There are no problems to display for this patient.    Mamie Nick, Counselor
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
## 2020-02-04 ENCOUNTER — Telehealth (HOSPITAL_COMMUNITY): Payer: Self-pay

## 2020-02-04 ENCOUNTER — Telehealth (HOSPITAL_COMMUNITY): Payer: Self-pay | Admitting: Psychiatry

## 2020-02-04 NOTE — Telephone Encounter (Signed)
D:  Dale Jacks, NP referred pt to [facility].  A:  Placed call to orient pt and get him scheduled for an assessment with Boneta Lucks.  Pt asked if he had to do PHP/group; states he would feel more comfortable one on one with a therapist instead.  Informed pt that Dale Jacks, NP wanted the appt scheduled as soon as possible d/t what brought him in to be seen.  Pt currently denies any SI.  Discussed safety options with pt at length; should he have those thoughts again.  Pt is scheduled to see Donia Guiles, LCSW on 02-06-20 @ 1:30 (virtual).  Inform Dale Jacks, NP. R:  Pt receptive.

## 2020-02-04 NOTE — Telephone Encounter (Signed)
Care Management - Follow up from Discharge from the [facility]   Per discharge recommendations in epic from the NP, Hillery Jacks, the patient will be linked with PHP services with [facility].   Writer spoke to the patient on the phone and the patient will has the number to the [facility] group program and will be contacting them to set up an appt.   Patient reports that he is able to schedule his own appointment.  Writer left name and phone number if he requires any assistance.

## 2020-02-06 ENCOUNTER — Other Ambulatory Visit: Payer: Self-pay

## 2020-02-06 ENCOUNTER — Other Ambulatory Visit (HOSPITAL_COMMUNITY): Payer: 59 | Attending: Psychiatry | Admitting: Licensed Clinical Social Worker

## 2020-02-06 DIAGNOSIS — F322 Major depressive disorder, single episode, severe without psychotic features: Secondary | ICD-10-CM

## 2020-02-12 NOTE — Psych (Signed)
Virtual Visit via Video Note  I connected with Dale Fitzgerald on 02/06/20 at  1:30 PM EST by a video enabled telemedicine application and verified that I am speaking with the correct person using two identifiers.  Location: Patient: patient home Provider: clinical home office   I discussed the limitations of evaluation and management by telemedicine and the availability of in person appointments. The patient expressed understanding and agreed to proceed.  History of Present Illness: Dale Fitzgerald is a 34 y.o. male patient with depression and SI. Pt was referred to [facility] post-walk-in to the [facility] due to suicidal ideation. Pt reports this was his first experience with SI and has disoriented him. Pt states he was discharged home and has not been alone until yesterday afternoon. Pt reports he has felt supported by his support system. Pt reports handling his solo time well and was able to maintain mood. Pt denies SI since last Friday. Pt denies substance use, AVH, and active SI/HI/self-harm.    Observations/Objective: Pt presents well and speaks at length about how his suicidal thoughts scared him. Pt presents as casual, well-groomed, oriented x5, and responsive facial expression.   Assessment and Plan: Pt is recommended for PHP due to SI and increased symptomology. Pt declines intensive treatment due to not wanting group therapy and not being able to take time off of work. Pt reports he has a therapist he worked with for over a year but stopped seeing due to switching to virtual during the pandemic. Pt reports desire to return to this therapist, Dale Fitzgerald. Pt reports desire to reach out on his own.   Follow Up Instructions: Pt is strongly encouraged to reach out to his therapist when he gets off this session to be scheduled quickly. Pt is given crisis information and informed he can call to engage if he changes his mind. Pt denies SI/HI at time of session.     I discussed the assessment and  treatment plan with the patient. The patient was provided an opportunity to ask questions and all were answered. The patient agreed with the plan and demonstrated an understanding of the instructions.   The patient was advised to call back or seek an in-person evaluation if the symptoms worsen or if the condition fails to improve as anticipated.  I provided 40 minutes of non-face-to-face time during this encounter.   Donia Guiles, LCSW

## 2021-04-27 DIAGNOSIS — B009 Herpesviral infection, unspecified: Secondary | ICD-10-CM | POA: Insufficient documentation

## 2021-08-21 ENCOUNTER — Other Ambulatory Visit: Payer: Self-pay

## 2021-08-21 ENCOUNTER — Ambulatory Visit (HOSPITAL_COMMUNITY)
Admission: EM | Admit: 2021-08-21 | Discharge: 2021-08-21 | Disposition: A | Discharge status: Home / Self Care | Payer: 59 | Attending: Physician Assistant | Admitting: Physician Assistant

## 2021-08-21 ENCOUNTER — Emergency Department (HOSPITAL_COMMUNITY): Payer: 59

## 2021-08-21 ENCOUNTER — Encounter (HOSPITAL_COMMUNITY): Payer: Self-pay | Admitting: Emergency Medicine

## 2021-08-21 ENCOUNTER — Encounter (HOSPITAL_COMMUNITY): Payer: Self-pay

## 2021-08-21 ENCOUNTER — Emergency Department (HOSPITAL_COMMUNITY)
Admission: EM | Admit: 2021-08-21 | Discharge: 2021-08-21 | Disposition: A | Discharge status: Home / Self Care | Payer: 59 | Attending: Emergency Medicine | Admitting: Emergency Medicine

## 2021-08-21 DIAGNOSIS — H539 Unspecified visual disturbance: Secondary | ICD-10-CM

## 2021-08-21 DIAGNOSIS — I10 Essential (primary) hypertension: Secondary | ICD-10-CM | POA: Diagnosis not present

## 2021-08-21 DIAGNOSIS — R42 Dizziness and giddiness: Secondary | ICD-10-CM

## 2021-08-21 DIAGNOSIS — R519 Headache, unspecified: Secondary | ICD-10-CM

## 2021-08-21 DIAGNOSIS — R079 Chest pain, unspecified: Secondary | ICD-10-CM

## 2021-08-21 DIAGNOSIS — F17200 Nicotine dependence, unspecified, uncomplicated: Secondary | ICD-10-CM | POA: Diagnosis not present

## 2021-08-21 HISTORY — DX: Essential (primary) hypertension: I10

## 2021-08-21 LAB — CBC
HCT: 48.3 % (ref 39.0–52.0)
Hemoglobin: 16.7 g/dL (ref 13.0–17.0)
MCH: 29.6 pg (ref 26.0–34.0)
MCHC: 34.6 g/dL (ref 30.0–36.0)
MCV: 85.5 fL (ref 80.0–100.0)
Platelets: 286 10*3/uL (ref 150–400)
RBC: 5.65 MIL/uL (ref 4.22–5.81)
RDW: 13.3 % (ref 11.5–15.5)
WBC: 10.2 10*3/uL (ref 4.0–10.5)
nRBC: 0 % (ref 0.0–0.2)

## 2021-08-21 LAB — BASIC METABOLIC PANEL
Anion gap: 9 (ref 5–15)
BUN: 14 mg/dL (ref 6–20)
CO2: 24 mmol/L (ref 22–32)
Calcium: 9.8 mg/dL (ref 8.9–10.3)
Chloride: 105 mmol/L (ref 98–111)
Creatinine, Ser: 1.31 mg/dL — ABNORMAL HIGH (ref 0.61–1.24)
GFR, Estimated: >60 mL/min (ref >60)
Glucose, Bld: 99 mg/dL (ref 70–99)
Potassium: 4.1 mmol/L (ref 3.5–5.1)
Sodium: 138 mmol/L (ref 135–145)

## 2021-08-21 LAB — D-DIMER, QUANTITATIVE: D-Dimer, Quant: 0.37 ug/mL-FEU (ref 0.00–0.50)

## 2021-08-21 LAB — CBG MONITORING, ED: Glucose-Capillary: 113 mg/dL — ABNORMAL HIGH (ref 70–99)

## 2021-08-21 LAB — TROPONIN I (HIGH SENSITIVITY)
Troponin I (High Sensitivity): 5 ng/L (ref <18)
Troponin I (High Sensitivity): 5 ng/L (ref <18)

## 2021-08-21 MED ORDER — ASPIRIN 81 MG PO CHEW
324.0000 mg | CHEWABLE_TABLET | Freq: Once | ORAL | Status: AC
  Administered 2021-08-21: 324 mg via ORAL
  Filled 2021-08-21: qty 4

## 2021-08-21 MED ORDER — KETOROLAC TROMETHAMINE 30 MG/ML IJ SOLN
30.0000 mg | Freq: Once | INTRAMUSCULAR | Status: AC
  Administered 2021-08-21: 30 mg via INTRAVENOUS
  Filled 2021-08-21: qty 1

## 2021-08-21 MED ORDER — DIPHENHYDRAMINE HCL 50 MG/ML IJ SOLN
50.0000 mg | Freq: Once | INTRAMUSCULAR | Status: AC
  Administered 2021-08-21: 50 mg via INTRAVENOUS
  Filled 2021-08-21: qty 1

## 2021-08-21 MED ORDER — NITROGLYCERIN 0.4 MG SL SUBL: 0.4000 mg | SUBLINGUAL_TABLET | SUBLINGUAL | Status: DC | PRN

## 2021-08-21 MED ORDER — IOHEXOL 350 MG/ML SOLN
100.0000 mL | Freq: Once | INTRAVENOUS | Status: AC | PRN
  Administered 2021-08-21: 100 mL via INTRAVENOUS

## 2021-08-21 MED ORDER — DEXAMETHASONE SODIUM PHOSPHATE 10 MG/ML IJ SOLN
10.0000 mg | Freq: Once | INTRAMUSCULAR | Status: AC
  Administered 2021-08-21: 10 mg via INTRAVENOUS
  Filled 2021-08-21: qty 1

## 2021-08-21 MED ORDER — METOCLOPRAMIDE HCL 5 MG/ML IJ SOLN
10.0000 mg | Freq: Once | INTRAMUSCULAR | Status: AC
Start: 2021-08-21 — End: 2021-08-21
  Administered 2021-08-21: 10 mg via INTRAVENOUS
  Filled 2021-08-21: qty 2

## 2021-08-21 MED ORDER — ONDANSETRON HCL 4 MG/2ML IJ SOLN
4.0000 mg | Freq: Once | INTRAMUSCULAR | Status: AC
  Administered 2021-08-21: 4 mg via INTRAVENOUS
  Filled 2021-08-21: qty 2

## 2021-08-21 NOTE — ED Provider Notes (Signed)
[facility] EMERGENCY DEPARTMENT Provider Note   CSN: [id number] Arrival date & time: 08/21/21  1733     History  Chief Complaint  Patient presents with   Dizziness    Dale Fitzgerald is a 36 y.o. male.   Dizziness  Patient with medical history of hypertension, dyslipidemia, family history of cardiac disease, prediabetes, tobacco dependence presents today due to a chest pain/presyncopal event.  This happened 6 hours ago, patient states he was sitting down when he had sudden onset of central chest pain.  This felt like pressure, did not radiate elsewhere.  Associated with right arm tingling, he did feel headache and like he was going to pass out but did not actually lose consciousness.  He has some blurry vision which resolved.  Felt SOB. no nausea or vomiting but he was pale and sweaty when it occurred per patient.  He has had pain like this before, unsure what caused it.  Patient denies any history of ACS or PE, the pain is still present but has been improving since that initially set on.  Unable to if any provoking features, no alleviating features at that time.  Not positional.  Not exertional.   Home Medications Prior to Admission medications   Medication Sig Start Date End Date Taking? Authorizing Provider  ADVIL 200 MG CAPS Take 200-400 mg by mouth every 6 (six) hours as needed (for mild pain or headaches).   Yes [provider]  gabapentin (NEURONTIN) 300 MG capsule Take 300 mg by mouth daily as needed (for nerve pain). 06/05/21  Yes [provider]  methocarbamol (ROBAXIN) 750 MG tablet Take 750 mg by mouth every 8 (eight) hours as needed for muscle spasms.   Yes [provider]  rosuvastatin (CRESTOR) 10 MG tablet Take 10 mg by mouth at bedtime. 05/18/21 05/18/22 Yes [provider]  UNKNOWN TO PATIENT Take 1 tablet by mouth See admin instructions. Unnamed B/P medication: Take 1 tablet by mouth once a day   Yes [provider]  lisinopril (ZESTRIL) 10 MG tablet Take 10 mg by mouth daily. 05/18/21   [provider]      Allergies    Patient has no known allergies.    Review of Systems   Review of Systems  Neurological:  Positive for dizziness.   Physical Exam Updated Vital Signs BP 131/67   Pulse 78   Temp 98.4 F (36.9 C) (Oral)   Resp 12   Ht  (1.88 m)   Wt 115.2 kg   SpO2 96%   BMI 32.61 kg/m  Physical Exam Vitals and nursing note reviewed. Exam conducted with a chaperone present.  Constitutional:      Appearance: Normal appearance.  HENT:     Head: Normocephalic and atraumatic.  Eyes:     General: No scleral icterus.       Right eye: No discharge.        Left eye: No discharge.     Extraocular Movements: Extraocular movements intact.     Pupils: Pupils are equal, round, and reactive to light.  Cardiovascular:     Rate and Rhythm: Normal rate and regular rhythm.     Pulses: Normal pulses.     Heart sounds: Normal heart sounds. No murmur heard.   No friction rub. No gallop.  Pulmonary:     Effort: Pulmonary effort is normal. No respiratory distress.     Breath sounds: Normal breath sounds.  Abdominal:  General: Abdomen is flat. Bowel sounds are normal. There is no distension.     Palpations: Abdomen is soft.     Tenderness: There is no abdominal tenderness.  Skin:    General: Skin is warm and dry.     Coloration: Skin is not jaundiced.  Neurological:     Mental Status: He is alert and oriented to person, place, and time. Mental status is at baseline.     Cranial Nerves: No cranial nerve deficit.     Motor: No weakness.     Coordination: Coordination normal.     Gait: Gait normal.     Comments: Cranial nerves III through XII are grossly intact, grip strength equal bilaterally.  Plantarflexion dorsiflexion 5/5 against resistance.  Psychiatric:        Mood and Affect: Mood normal.    ED Results / Procedures / Treatments   Labs (all labs ordered are  listed, but only abnormal results are displayed) Labs Reviewed  BASIC METABOLIC PANEL - Abnormal; Notable for the following components:      Result Value   Creatinine, Ser 1.31 (*)    All other components within normal limits  CBC  D-DIMER, QUANTITATIVE  TROPONIN I (HIGH SENSITIVITY)  TROPONIN I (HIGH SENSITIVITY)    EKG EKG Interpretation  Date/Time:  Friday August 21 2021 19:38:08 EDT Ventricular Rate:  85 PR Interval:  144 QRS Duration: 108 QT Interval:  339 QTC Calculation: 403 R Axis:   -65 Text Interpretation: Sinus rhythm Incomplete RBBB and LAFB LVH with secondary repolarization abnormality when compared to prior, similar abnormal t waves. NO sTEMI Confirmed by Theda Belfast ([id number]) on 08/21/2021 7:42:48 PM  Radiology CT ANGIO HEAD NECK W WO CM  Result Date: 08/21/2021 CLINICAL DATA:  Initial evaluation for acute headache. EXAM: CT ANGIOGRAPHY HEAD AND NECK TECHNIQUE: Multidetector CT imaging of the head and neck was performed using the standard protocol during bolus administration of intravenous contrast. Multiplanar CT image reconstructions and MIPs were obtained to evaluate the vascular anatomy. Carotid stenosis measurements (when applicable) are obtained utilizing NASCET criteria, using the distal internal carotid diameter as the denominator. RADIATION DOSE REDUCTION: This exam was performed according to the departmental dose-optimization program which includes automated exposure control, adjustment of the mA and/or kV according to patient size and/or use of iterative reconstruction technique. CONTRAST:  OMNIPAQUE IOHEXOL 350 MG/ML SOLN COMPARISON:  Prior study from 02/17/2010. FINDINGS: CT HEAD FINDINGS Brain: Cerebral volume within normal limits for patient age. No evidence for acute intracranial hemorrhage. No findings to suggest acute large vessel territory infarct. No mass lesion, midline shift, or mass effect. Ventricles are normal in size without evidence for  hydrocephalus. No extra-axial fluid collection identified. Vascular: No hyperdense vessel identified. Skull: Scalp soft tissues demonstrate no acute abnormality. Calvarium intact. Sinuses/Orbits: Globes and orbital soft tissues within normal limits. Visualized paranasal sinuses are clear. No mastoid effusion. CTA NECK FINDINGS Aortic arch: Standard branching. Imaged portion shows no evidence of aneurysm or dissection. No significant stenosis of the major arch vessel origins. Right carotid system: Right common and internal carotid arteries widely patent without stenosis, dissection or occlusion. Left carotid system: Left common and internal carotid arteries widely patent without stenosis, dissection or occlusion. Vertebral arteries: Left vertebral artery dominant arises directly from the aortic arch. Left vertebral artery widely patent within the neck without stenosis or dissection. Right vertebral artery markedly hypoplastic and faintly visible, but remains grossly patent within the neck without stenosis or other visible acute abnormality. Skeleton:  No discrete or worrisome osseous lesions. Other neck: No other acute soft tissue abnormality within the neck. Upper chest: Visualized upper chest demonstrates no acute finding. Review of the MIP images confirms the above findings CTA HEAD FINDINGS Anterior circulation: Both internal carotid arteries widely patent to the termini without stenosis. A1 segments widely patent. Normal anterior communicating artery complex. Both anterior cerebral arteries widely patent to their distal aspects without stenosis. No M1 stenosis or occlusion. Normal MCA bifurcations. Distal MCA branches well perfused and symmetric. Posterior circulation: Dominant left vertebral artery widely patent to the vertebrobasilar junction. Left PICA not seen. Hypoplastic right vertebral artery faintly visible and likely largely terminates in PICA. Basilar diminutive but widely patent to its distal aspect.  Superior cerebral arteries patent bilaterally. Predominant fetal type origin of both PCAs. PCAs well perfused or distal aspects without stenosis. Venous sinuses: Patent allowing for timing the contrast bolus. Anatomic variants: As above.  No aneurysm. Review of the MIP images confirms the above findings IMPRESSION: 1. Normal CTA of the head and neck. No large vessel occlusion, hemodynamically significant stenosis, or other acute vascular abnormality. No aneurysm. 2. No other acute intracranial abnormality. Electronically Signed   By: Rise MuBenjamin  McClintock M.D.   On: 08/21/2021 22:59   DG Chest 1 View  Result Date: 08/21/2021 CLINICAL DATA:  Chest pain. EXAM: CHEST  1 VIEW COMPARISON:  Chest x-ray 07/13/2016. FINDINGS: The heart size and mediastinal contours are within normal limits. Both lungs are clear. The visualized skeletal structures are unremarkable. IMPRESSION: No active disease. Electronically Signed   By: Darliss CheneyAmy  Guttmann M.D.   On: 08/21/2021 18:23    Procedures Procedures    Medications Ordered in ED Medications  aspirin chewable tablet 324 mg (324 mg Oral Given 08/21/21 2043)  ketorolac (TORADOL) 30 MG/ML injection 30 mg (30 mg Intravenous Given 08/21/21 2115)  iohexol (OMNIPAQUE) 350 MG/ML injection 100 mL (100 mLs Intravenous Contrast Given 08/21/21 2112)  ondansetron (ZOFRAN) injection 4 mg (4 mg Intravenous Given 08/21/21 2115)  metoCLOPramide (REGLAN) injection 10 mg (10 mg Intravenous Given 08/21/21 2312)  dexamethasone (DECADRON) injection 10 mg (10 mg Intravenous Given 08/21/21 2312)  diphenhydrAMINE (BENADRYL) injection 50 mg (50 mg Intravenous Given 08/21/21 2313)    ED Course/ Medical Decision Making/ A&P                           Medical Decision Making Amount and/or Complexity of Data Reviewed Labs: ordered. Radiology: ordered.  Risk Prescription drug management.   Patient presents due to headache and chest pain.  Differential diagnosis includes but not limited [facility], [facility], PE,  electrolyte derangement, anemia, CVA, carotid dissection, vasovagal, anxiety.  Patient wife is at bedside providing independent history.  I reviewed external records, including patient's previous EKGs.  Patient has cardiac risk factors including smoking, hypertension, hyperlipidemia, family history of CAD.  On exam patient does not have any focal neurodeficits, S1-S2.  He slightly hypertensive but no hypoxia or tachypnea.  Lungs are clear to auscultation bilaterally.  I ordered and reviewed laboratory work-up.  Patient does not have a leukocytosis or anemia.  There is no gross electrolyte derangement, creatinine slightly elevated at 1.31 but no urinary symptoms.  Troponin delta negative, dimer negative.  I doubt this is PE.  Chest x-ray ordered and viewed by myself.  No acute process, I agree with radiologist interpretation.  I viewed the EKG.  Patient has T wave inversions in the inferior leads and V5 and  V6, patient chart review this is not new.  He also has an incomplete right bundle branch block which is not new.  No Brugada, no Wellens sign.  I ordered Toradol and aspirin and Zofran.  On reevaluation patient states she is having the worst headache headache of his life.  Its been worsening throughout his ED stay.  I Ordered Reglan, Benadryl and Decadron.  We will proceed with CT a of his head and neck.  CTA head and neck reviewed, no acute process noted.  I agree with radiologist interpretation.  On reexamination patient is not having any chest pain.  Headache is still present but improving.  His vitals are stable, I do not feel he needs any additional work-up at this time.  Discussed plan with patient, partner and will mother who is now at bedside.  I think patient needs to follow-up with cardiology given risk factors and chest pain without a clear etiology.  We will have patient follow-up with PCP next week for reevaluation regarding the headache.  I considered meningitis but he is not  febrile, there is no nuchal rigidity and I think that is unlikely in this clinical picture.  Patient discharged in stable condition.        Final Clinical Impression(s) / ED Diagnoses Final diagnoses:  Chest pain, unspecified type    Rx / DC Orders ED Discharge Orders     None         Theron Arista, [place] 08/21/21 2316    Tegeler, Canary Brim, MD 08/21/21 2322

## 2021-08-21 NOTE — ED Provider Notes (Addendum)
[facility]    CSN: [id number] Arrival date & time: 08/21/21  1618      History   Chief Complaint Chief Complaint  Patient presents with   Chest Pain   Dizziness   Tingling    HPI Dale Fitzgerald is a 36 y.o. male.   Pt presents with chest pain, dizziness, and right arm tingling that started around noon today.  He reports he was at work and started to feel unwell, almost passed out, a Cabin crew caught him.  Persistent chest pain and blurry vision since that time. He reports some shortness of breath, but is attributing it to anxiousness due to chest pain.  Pt has a h/o HTN, smoker.  Reports father with h/o heart failure.     Past Medical History:  Diagnosis Date   Anxiety    Hypertension     Patient Active Problem List   Diagnosis Date Noted   Depression     Past Surgical History:  Procedure Laterality Date   APPENDECTOMY         Home Medications    Prior to Admission medications   Medication Sig Start Date End Date Taking? Authorizing Provider  lisinopril (ZESTRIL) 10 MG tablet Take 10 mg by mouth daily. 05/18/21  Yes [provider]  rosuvastatin (CRESTOR) 10 MG tablet Take 10 mg by mouth daily. 05/18/21 05/18/22 Yes [provider]  gabapentin (NEURONTIN) 300 MG capsule Take 300 mg by mouth as needed. 06/05/21   [provider]    Family History History reviewed. No pertinent family history.  Social History Social History   Tobacco Use   Smoking status: Every Day    Packs/day: 0.50    Years: 10.00    Pack years: 5.00    Types: Cigarettes  Substance Use Topics   Alcohol use: Yes   Drug use: No     Allergies   Patient has no known allergies.   Review of Systems Review of Systems  Constitutional:  Negative for chills and fever.  HENT:  Negative for ear pain and sore throat.   Eyes:  Positive for visual disturbance. Negative for pain.  Respiratory:  Negative for cough and shortness of breath.    Cardiovascular:  Positive for chest pain. Negative for palpitations.  Gastrointestinal:  Negative for abdominal pain and vomiting.  Genitourinary:  Negative for dysuria and hematuria.  Musculoskeletal:  Negative for arthralgias and back pain.  Skin:  Negative for color change and rash.  Neurological:  Positive for dizziness. Negative for seizures and syncope.  All other systems reviewed and are negative.   Physical Exam Triage Vital Signs ED Triage Vitals  Enc Vitals Group     BP 08/21/21 1657 133/77     Pulse Rate 08/21/21 1652 91     Resp 08/21/21 1652 18     Temp 08/21/21 1652 98.6 F (37 C)     Temp Source 08/21/21 1652 Oral     SpO2 08/21/21 1652 94 %     Weight --      Height --      Head Circumference --      Peak Flow --      Pain Score 08/21/21 1657 7     Pain Loc --      Pain Edu? --      Excl. in GC? --    No data found.  Updated Vital Signs BP 133/77   Pulse 91   Temp 98.6 F (37 C) (  Oral)   Resp 18   SpO2 94%   Visual Acuity Right Eye Distance:   Left Eye Distance:   Bilateral Distance:    Right Eye Near:   Left Eye Near:    Bilateral Near:     Physical Exam Vitals and nursing note reviewed.  Constitutional:      General: He is not in acute distress.    Appearance: He is well-developed.  HENT:     Head: Normocephalic and atraumatic.  Eyes:     Conjunctiva/sclera: Conjunctivae normal.  Cardiovascular:     Rate and Rhythm: Normal rate and regular rhythm.     Heart sounds: No murmur heard. Pulmonary:     Effort: Pulmonary effort is normal. No respiratory distress.     Breath sounds: Normal breath sounds.  Abdominal:     Palpations: Abdomen is soft.     Tenderness: There is no abdominal tenderness.  Musculoskeletal:        General: No swelling.     Cervical back: Neck supple.  Skin:    General: Skin is warm and dry.     Capillary Refill: Capillary refill takes less than 2 seconds.  Neurological:     Mental Status: He is alert.   Psychiatric:        Mood and Affect: Mood normal.     UC Treatments / Results  Labs (all labs ordered are listed, but only abnormal results are displayed) Labs Reviewed  CBG MONITORING, ED - Abnormal; Notable for the following components:      Result Value   Glucose-Capillary 113 (*)    All other components within normal limits    EKG   Radiology No results found.  Procedures Procedures (including critical care time)  Medications Ordered in UC Medications - No data to display  Initial Impression / Assessment and Plan / UC Course  I have reviewed the triage vital signs and the nursing notes.  Pertinent labs & imaging results that were available during my care of the patient were reviewed by me and considered in my medical decision making (see chart for details).     EKG with non speficic ST and T wave abnormalities, similar to previous EKG.  Vitals wnl.  Given persistent chest pain, blurred vision, dizziness recommend further evaluation in the ED.  Pt agrees with plan.  Final Clinical Impressions(s) / UC Diagnoses   Final diagnoses:  Chest pain, unspecified type     Discharge Instructions      Recommend further evaluation in the ED     ED Prescriptions   None    PDMP not reviewed this encounter.   Ward, Tylene Fantasia, PA-C 08/21/21 1736    Ward, Tylene Fantasia, PA-C 08/21/21 1925

## 2021-08-21 NOTE — ED Triage Notes (Signed)
Patient c/o Chest pain, Dizziness, and RT arm tingling that started today 1200 pm.   Patient endorses SOB at times. Patient endorses headache.   Patient denies N/V. Patient denies LOC.   Patient endorses chest pain is located in the middle of chest.   Patient endorses onset of symptoms started while at work.   History of HTN.

## 2021-08-21 NOTE — ED Notes (Signed)
Patient is being discharged from the Urgent Care and sent to the Emergency Department via Personal Vehicle w/ significant other . Per Ward PA-C, patient is in need of higher level of care due to Chest Pain, Dizziness, and Tingling. Patient is aware and verbalizes understanding of plan of care.  Vitals:   08/21/21 1652 08/21/21 1657  BP:  133/77  Pulse: 91   Resp: 18   Temp: 98.6 F (37 C)   SpO2: 94%

## 2021-08-21 NOTE — Discharge Instructions (Signed)
Your work-up today was reassuring, as we discussed there were no signs of blood clots, heart attack or small vessel occlusion in your neck or head.  Also no signs of bleeding inside your head.  I would recommend taking Tylenol Motrin for the headache, this should improve.  I put in a referral for cardiology, the office will call you to schedule a follow-up appointment.  Follow-up with your primary next week for reevaluation.  If you develop a fever return back to the ED for further evaluation.  Additionally if you have an episode where you lose consciousness return to ED.

## 2021-08-21 NOTE — ED Provider Triage Note (Signed)
Emergency Medicine Provider Triage Evaluation Note  Dale Fitzgerald , a 36 y.o. male  was evaluated in triage.  Pt complains of cp. Report pain to mid chest while at work today.  Report dizziness, and break out in a sweat.  Still having some cp but it has improved.  Was seen at [facility] but ssent here for further eval. No fever, productive cough, nausea.  Report family hx of cardiac disease.  Also admits tobacco use.   Review of Systems  Positive: As above Negative: As above  Physical Exam  BP 134/83 (BP Location: Right Arm)   Pulse 87   Temp 98.4 F (36.9 C) (Oral)   Resp 16   Ht 6\' 2"  (1.88 m)   Wt 115.2 kg   SpO2 95%   BMI 32.61 kg/m  Gen:   Awake, no distress   Resp:  Normal effort  MSK:   Moves extremities without difficulty  Other:    Medical Decision Making  Medically screening exam initiated at 5:50 PM.  Appropriate orders placed.  Fabio Neighbors was informed that the remainder of the evaluation will be completed by another provider, this initial triage assessment does not replace that evaluation, and the importance of remaining in the ED until their evaluation is complete.     Domenic Moras, PA-C 08/21/21 1755

## 2021-08-21 NOTE — Discharge Instructions (Signed)
Recommend further evaluation in the ED

## 2021-08-21 NOTE — ED Notes (Signed)
Pt ambulated to bathroom independently

## 2021-08-21 NOTE — ED Triage Notes (Signed)
Patient reports today had dizziness around 12pm with cp and right arm numbness tingling.  +SOB +HA +blurred vision -nausea.

## 2021-09-01 DIAGNOSIS — I10 Essential (primary) hypertension: Secondary | ICD-10-CM | POA: Insufficient documentation

## 2021-09-01 DIAGNOSIS — E1159 Type 2 diabetes mellitus with other circulatory complications: Secondary | ICD-10-CM | POA: Insufficient documentation

## 2021-09-01 DIAGNOSIS — E782 Mixed hyperlipidemia: Secondary | ICD-10-CM | POA: Insufficient documentation

## 2021-09-01 DIAGNOSIS — R7301 Impaired fasting glucose: Secondary | ICD-10-CM | POA: Insufficient documentation

## 2021-09-08 ENCOUNTER — Encounter: Payer: Self-pay | Admitting: *Deleted

## 2022-09-04 IMAGING — DX DG CHEST 1V
1 series · 1 of 1 positions shown · non-contrast
Comparison: Chest x-ray 07/13/2016.

CLINICAL DATA: Chest pain.

EXAM:
CHEST  1 VIEW

[chest pa]
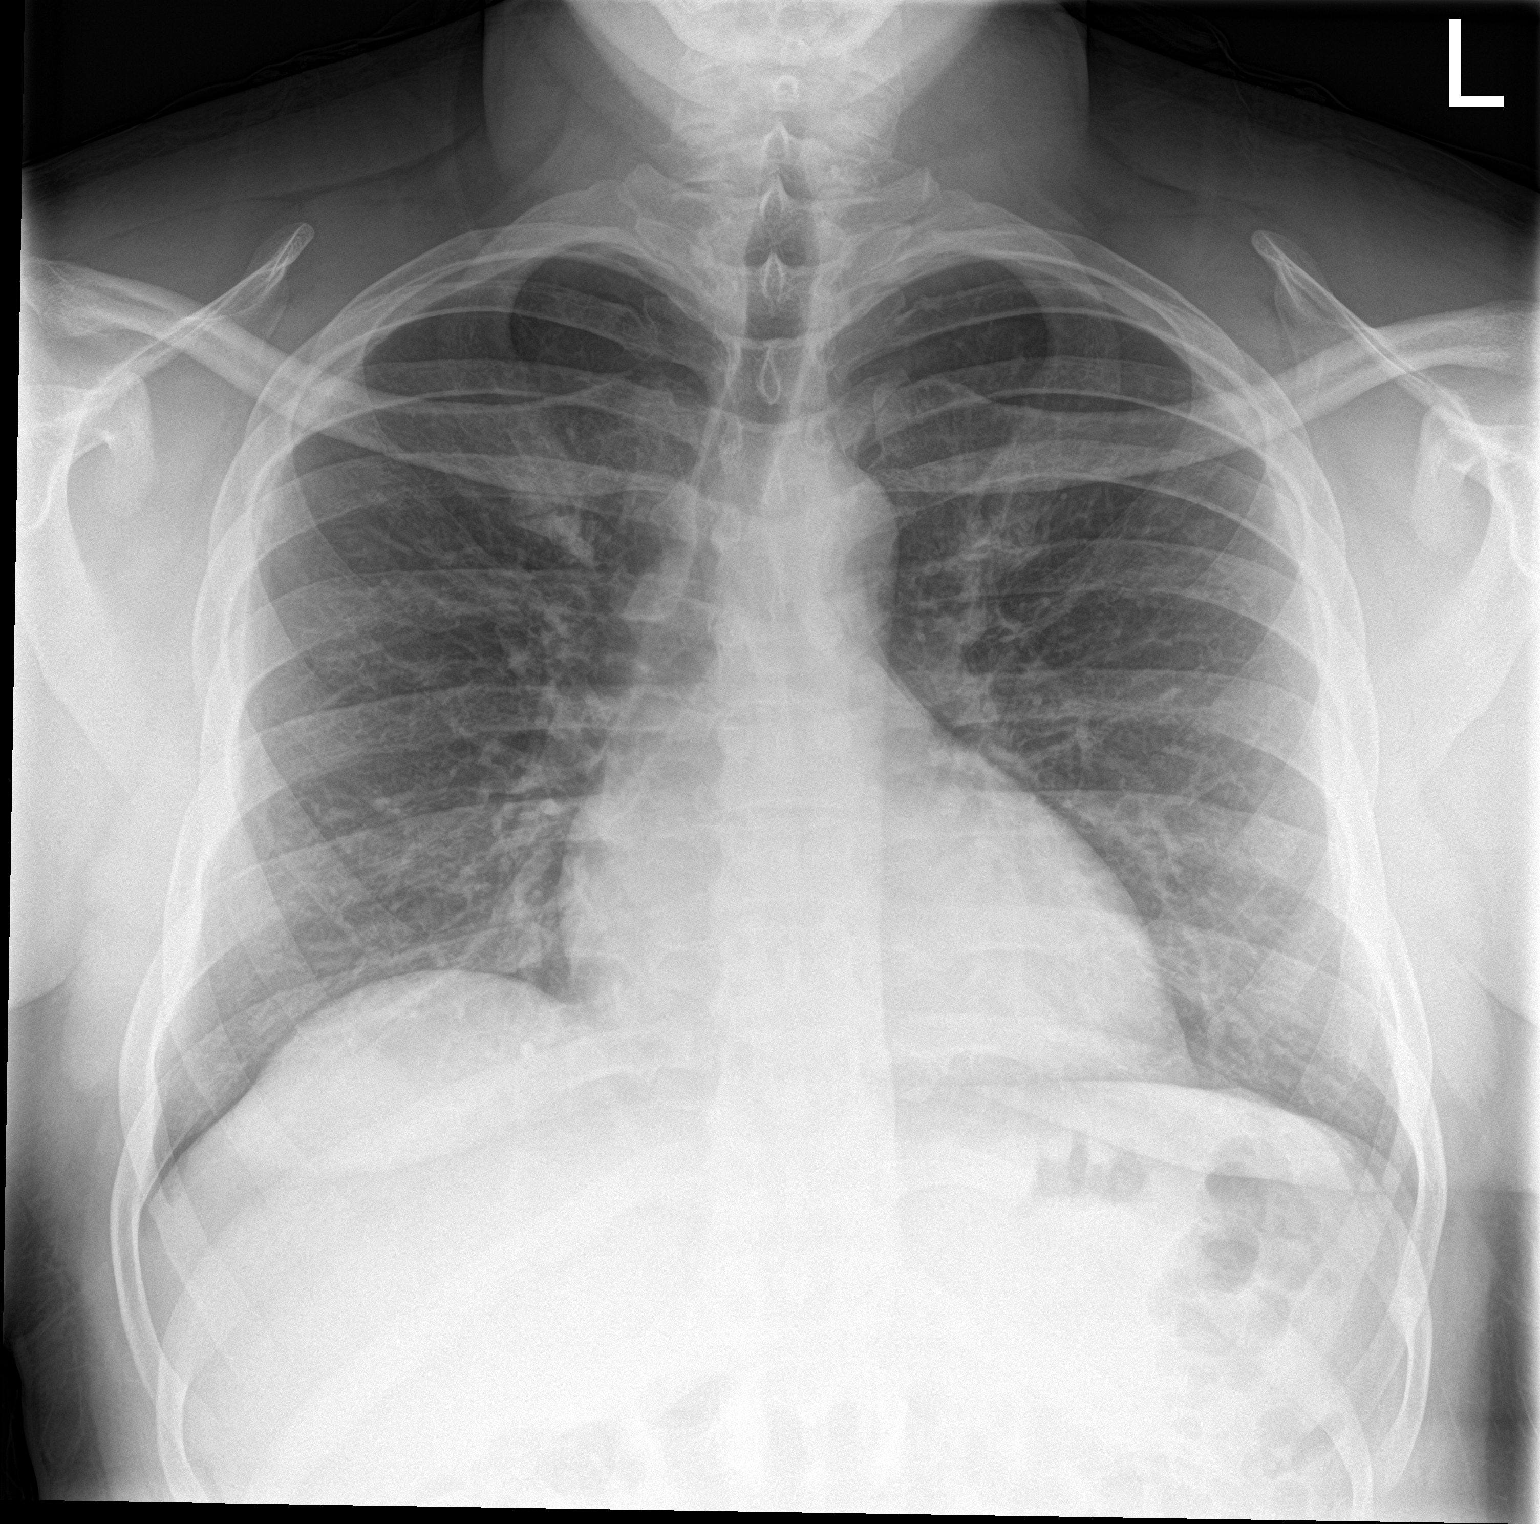

[1 of 1 positions shown; findings below may reference images not displayed]

FINDINGS: The heart size and mediastinal contours are within normal limits.
Both lungs are clear. The visualized skeletal structures are
unremarkable.
IMPRESSION: No active disease.

## 2023-07-11 ENCOUNTER — Emergency Department (HOSPITAL_COMMUNITY)

## 2023-07-11 ENCOUNTER — Emergency Department (HOSPITAL_COMMUNITY)
Admission: EM | Admit: 2023-07-11 | Discharge: 2023-07-12 | Disposition: A | Discharge status: Home / Self Care | Attending: Emergency Medicine | Admitting: Emergency Medicine

## 2023-07-11 ENCOUNTER — Other Ambulatory Visit: Payer: Self-pay

## 2023-07-11 DIAGNOSIS — Y9367 Activity, basketball: Secondary | ICD-10-CM | POA: Diagnosis not present

## 2023-07-11 DIAGNOSIS — M25561 Pain in right knee: Secondary | ICD-10-CM | POA: Diagnosis present

## 2023-07-11 DIAGNOSIS — X509XXA Other and unspecified overexertion or strenuous movements or postures, initial encounter: Secondary | ICD-10-CM | POA: Insufficient documentation

## 2023-07-11 MED ORDER — MORPHINE SULFATE (PF) 2 MG/ML IV SOLN
2.0000 mg | Freq: Once | INTRAVENOUS | Status: AC
  Administered 2023-07-11: 2 mg via INTRAVENOUS
  Filled 2023-07-11: qty 1

## 2023-07-11 MED ORDER — HYDROCODONE-ACETAMINOPHEN 5-325 MG PO TABS: 2.0000 | ORAL_TABLET | ORAL | 0 refills | Status: DC | PRN

## 2023-07-11 NOTE — ED Provider Notes (Signed)
 [place] [facility]ERGENCY DEPARTMENT AT [facility] Provider Note   CSN: [id number] Arrival date & time: 07/11/23  2029     History  Chief Complaint  Patient presents with   Knee Injury    Dale Fitzgerald is a 38 y.o. male.  HPI Patient is a 38 year old male presents the ED today complaining of right knee pain that happened after playing basketball where he noted that he jumped and when he jumped he felt a "pop".  Noted that he has not been able to ambulate on his own since.  Reports that the pain is mostly located around the lower part of his patella.  Has had ice placed on it ever since.  No complaints otherwise.  Denies numbness, tingling, weakness.    Home Medications Prior to Admission medications   Medication Sig Start Date End Date Taking? Authorizing Provider  HYDROcodone -acetaminophen  (NORCO/VICODIN) 5-325 MG tablet Take 2 tablets by mouth every 4 (four) hours as needed. 07/11/23  Yes Jaelan Rasheed S, PA-C  ADVIL 200 MG CAPS Take 200-400 mg by mouth every 6 (six) hours as needed (for mild pain or headaches).    [provider]  gabapentin (NEURONTIN) 300 MG capsule Take 300 mg by mouth daily as needed (for nerve pain). 06/05/21   [provider]  lisinopril (ZESTRIL) 10 MG tablet Take 10 mg by mouth daily. 05/18/21   [provider]  methocarbamol  (ROBAXIN ) 750 MG tablet Take 750 mg by mouth every 8 (eight) hours as needed for muscle spasms.    [provider]  rosuvastatin (CRESTOR) 10 MG tablet Take 10 mg by mouth at bedtime. 05/18/21 05/18/22  [provider]  UNKNOWN TO PATIENT Take 1 tablet by mouth See admin instructions. Unnamed B/P medication: Take 1 tablet by mouth once a day    [provider]      Allergies    Patient has no known allergies.    Review of Systems   Review of Systems  Musculoskeletal:  Positive for arthralgias.  All other systems reviewed and are negative.   Physical Exam Updated Vital  Signs BP (!) 139/94   Pulse 89   Temp 98.2 F (36.8 C)   Resp 18   SpO2 98%  Physical Exam Vitals and nursing note reviewed.  Constitutional:      General: He is not in acute distress.    Appearance: Normal appearance. He is not ill-appearing.  HENT:     Head: Normocephalic and atraumatic.  Eyes:     General:        Right eye: No discharge.        Left eye: No discharge.     Extraocular Movements: Extraocular movements intact.     Conjunctiva/sclera: Conjunctivae normal.  Cardiovascular:     Rate and Rhythm: Normal rate and regular rhythm.     Pulses: Normal pulses.     Heart sounds: Normal heart sounds. No murmur heard.    No friction rub. No gallop.  Pulmonary:     Effort: Pulmonary effort is normal. No respiratory distress.     Breath sounds: Normal breath sounds. No wheezing or rales.  Abdominal:     General: Abdomen is flat.     Palpations: Abdomen is soft.     Tenderness: There is no abdominal tenderness.  Musculoskeletal:        General: Swelling, tenderness and deformity (Right patella was noted to be more elevated than the left when comparing) present.  Comments: Tenderness to palpation and swelling noted to the distal aspect of the patella.  Skin:    General: Skin is warm and dry.     Findings: No bruising or erythema.  Neurological:     General: No focal deficit present.     Mental Status: He is alert.  Psychiatric:        Mood and Affect: Mood normal.     ED Results / Procedures / Treatments   Labs (all labs ordered are listed, but only abnormal results are displayed) Labs Reviewed - No data to display  EKG None  Radiology DG Knee Complete 4 Views Right Result Date: 07/11/2023 CLINICAL DATA:  Right knee pain and swelling after jumping. EXAM: RIGHT KNEE - COMPLETE 4+ VIEW COMPARISON:  None Available. FINDINGS: Patellar Alta. Indistinct patellar tendon particularly at the patellar insertion. There is a small knee joint effusion. No acute fracture.  Normal tibiofemoral alignment. Anterior soft tissue edema IMPRESSION: 1. Indistinct patellar tendon particularly at the patellar insertion, suspicious for patellar tendon injury. Associated patellar Margeret Sheer. 2. Small knee joint effusion. 3. No acute fracture. Electronically Signed   By: Chadwick Colonel M.D.   On: 07/11/2023 23:23    Procedures Procedures    Medications Ordered in ED Medications  morphine  (PF) 2 MG/ML injection 2 mg (2 mg Intravenous Given 07/11/23 2213)    ED Course/ Medical Decision Making/ A&P                                 Medical Decision Making Amount and/or Complexity of Data Reviewed Radiology: ordered.  Risk Prescription drug management.   This patient is a 38 year old male  who presents to the ED for concern of right knee pain after jumping while playing basketball, noted to have heard a pop in his right knee and has been unable to ambulate without assistance since.    On physical exam, patient is in no acute distress, afebrile, alert and orient x 4, speaking in full sentences, nontachypneic, nontachycardic. Swelling noted to the distal aspect of the patella as well as patella noted to be slightly elevated when compared to opposite leg.  No bruising noted at this time.  No tenderness to palpation in the popliteal region.  Able to move foot and lift leg.  No other injuries noted, unremarkable exam otherwise.  X-ray noted possible patellar tendon injury to right knee, noted patella alta, and small joint effusion.  Will place in knee immobilizer and provided crutches and have him follow-up with orthopedics for further evaluation.  Prescribed Norco for him to be will to manage pain at home as well as told him to continue with symptomatic management at home.  Patient vital signs have remained stable throughout the course of patient's time in the ED. Low suspicion for any other emergent pathology at this time. I believe this patient is safe to be discharged. Provided  strict return to ER precautions. Patient expressed agreement and understanding of plan. All questions were answered.  Differential diagnoses prior to evaluation: The emergent differential diagnosis includes, but is not limited to, fracture, dislocation, neurovascular injury, ligamentous injury. This is not an exhaustive differential.   Past Medical History / Co-morbidities / Social History: Anxiety, hypertension  Additional history: Chart reviewed. Pertinent results include: No previous injuries noted when evaluating his chart.  Lab Tests/Imaging studies: I personally interpreted labs/imaging and the pertinent results include:  X-ray of right knee shows indistinct  patellar tendon associated with patella alta, small knee joint effusion .  I agree with the radiologist interpretation.    Medications: I ordered medication including morphine  here and Norco to go home.  I have reviewed the patients home medicines and have made adjustments as needed.   Disposition: After consideration of the diagnostic results and the patients response to treatment, I feel that the patient would benefit from discharge and treatment as above. .   emergency department workup does not suggest an emergent condition requiring admission or immediate intervention beyond what has been performed at this time. The plan is: Norco for pain relief as well as Tylenol  ibuprofen, follow-up with [facility], knee immobilizer and crutches. The patient is safe for discharge and has been instructed to return immediately for worsening symptoms, change in symptoms or any other concerns.   Final Clinical Impression(s) / ED Diagnoses Final diagnoses:  Acute pain of right knee    Rx / DC Orders ED Discharge Orders          Ordered    HYDROcodone -acetaminophen  (NORCO/VICODIN) 5-325 MG tablet  Every 4 hours PRN        07/11/23 2340              Hayes Lipps, PA-C 07/11/23 2357    Sallyanne Creamer, DO 07/15/23  2107

## 2023-07-11 NOTE — Discharge Instructions (Addendum)
 You were seen today for right knee pain after basketball injury.  It did show on x-ray that you did have some fluid in the joint as well as a injury to your patella tendon.  With you having heard a pop, recommend that you consider to follow-up with orthopedics for further evaluation.  I am prescribing you some pain medication to take at home.  This is a narcotic medication, recommend that you try ibuprofen and Tylenol  first as narcotic medication can have side effects including constipation, bad dreams, addiction risks.  Take Tylenol  (acetominophen)  650mg  every 4-6 hours, as needed for pain or fever. Do not take more than 4,000 mg in a 24-hour period. As this may cause liver damage. While this is rare, if you begin to develop yellowing of the skin or eyes, stop taking and return to ER immediately.  Take Ibuprofen 400mg  every 4-6 hours for pain or fever, not exceeding 3,200 mg per day as more than 3,200mg  can cause Stomach irritation, dizziness, kidney issues with long-term use.  You can follow-up with [facility] which I have attached information to this AVS.  You can also contact Dr. Latisha Poland office for further evaluation.

## 2023-07-11 NOTE — ED Triage Notes (Signed)
 BIBA- Pt injured right knee playing basketball. 100 mcg Fentanyl given PTA, VSS.

## 2023-07-12 ENCOUNTER — Encounter: Payer: Self-pay | Admitting: Orthopaedic Surgery

## 2023-07-12 ENCOUNTER — Ambulatory Visit: Admitting: Orthopaedic Surgery

## 2023-07-12 DIAGNOSIS — S86811A Strain of other muscle(s) and tendon(s) at lower leg level, right leg, initial encounter: Secondary | ICD-10-CM | POA: Insufficient documentation

## 2023-07-12 NOTE — Progress Notes (Signed)
 Office Visit Note   Patient: Dale Fitzgerald           Date of Birth: 07-22-1985           MRN: [id number] Visit Date: 07/12/2023              Requested by: No referring provider defined for this encounter. PCP: Patient, No Pcp Per   Assessment & Plan: Visit Diagnoses:  1. Patellar tendon rupture, right, initial encounter     Plan: Dale Fitzgerald is a 38 year old gentleman with suspected full-thickness rupture of right patellar tendon.  Will need MRI ASAP for thorough evaluation and surgical planning.  In the meantime we placed him in a locked [place].  Work note was provided.  Handicap placard given.  Dale Fitzgerald will call him to confirm surgery date.  Detailed surgical discussion including risk benefits prognosis reviewed.  Follow-Up Instructions: No follow-ups on file.   Orders:  Orders Placed This Encounter  Procedures   MR Knee Right w/o contrast   No orders of the defined types were placed in this encounter.     Procedures: No procedures performed   Clinical Data: No additional findings.   Subjective: Chief Complaint  Patient presents with   Right Knee - Pain    DOI 07/11/2023    HPI Dale Fitzgerald is a very pleasant 38 year old gentleman who comes in for evaluation of right knee injury that occurred yesterday while playing basketball.  He reports that he went up to jump and felt a pop in his knee and went to the [facility] Fitzgerald, ED for evaluation.  Kneecap felt a little bit higher than usual.  He has been unable to extend his knee against gravity or bear full weight.  He is currently in a knee immobilizer.  Pain is better with rest and worse with movement. Review of Systems  Constitutional: Negative.   HENT: Negative.    Eyes: Negative.   Respiratory: Negative.    Cardiovascular: Negative.   Gastrointestinal: Negative.   Endocrine: Negative.   Genitourinary: Negative.   Skin: Negative.   Allergic/Immunologic: Negative.   Neurological: Negative.   Hematological: Negative.    Psychiatric/Behavioral: Negative.    All other systems reviewed and are negative.    Objective: Vital Signs: There were no vitals taken for this visit.  Physical Exam Vitals and nursing note reviewed.  Constitutional:      Appearance: He is well-developed.  HENT:     Head: Normocephalic and atraumatic.  Eyes:     Pupils: Pupils are equal, round, and reactive to light.  Pulmonary:     Effort: Pulmonary effort is normal.  Abdominal:     Palpations: Abdomen is soft.  Musculoskeletal:        General: Normal range of motion.     Cervical back: Neck supple.  Skin:    General: Skin is warm.  Neurological:     Mental Status: He is alert and oriented to person, place, and time.  Psychiatric:        Behavior: Behavior normal.        Thought Content: Thought content normal.        Judgment: Judgment normal.     Ortho Exam Exam of the right knee shows significant tenderness throughout the right knee.  There is a large joint effusion.  He is exquisitely tender to the infrapatellar region.  Difficult to palpate a defect due to the swelling.  He is unable to extend his knee against gravity. Specialty Comments:  No specialty comments available.  Imaging: DG Knee Complete 4 Views Right Result Date: 07/11/2023 CLINICAL DATA:  Right knee pain and swelling after jumping. EXAM: RIGHT KNEE - COMPLETE 4+ VIEW COMPARISON:  None Available. FINDINGS: Patellar Alta. Indistinct patellar tendon particularly at the patellar insertion. There is a small knee joint effusion. No acute fracture. Normal tibiofemoral alignment. Anterior soft tissue edema IMPRESSION: 1. Indistinct patellar tendon particularly at the patellar insertion, suspicious for patellar tendon injury. Associated patellar Margeret Sheer. 2. Small knee joint effusion. 3. No acute fracture. Electronically Signed   By: Chadwick Colonel M.D.   On: 07/11/2023 23:23     PMFS History: Patient Active Problem List   Diagnosis Date Noted   Patellar  tendon rupture, right, initial encounter 07/12/2023   Depression    Past Medical History:  Diagnosis Date   Anxiety    Hypertension     No family history on file.  Past Surgical History:  Procedure Laterality Date   APPENDECTOMY     Social History   Occupational History   Not on file  Tobacco Use   Smoking status: Every Day    Current packs/day: 0.50    Average packs/day: 0.5 packs/day for 10.0 years (5.0 ttl pk-yrs)    Types: Cigarettes   Smokeless tobacco: Not on file  Vaping Use   Vaping status: Never Used  Substance and Sexual Activity   Alcohol use: Yes   Drug use: Yes    Types: Marijuana   Sexual activity: Not on file

## 2023-07-12 NOTE — ED Notes (Signed)
 Patient could not tolerate w/c and wanted to use crutches to get the vehicle.

## 2023-07-13 ENCOUNTER — Other Ambulatory Visit: Payer: Self-pay | Admitting: Physician Assistant

## 2023-07-13 ENCOUNTER — Ambulatory Visit
Admission: RE | Admit: 2023-07-13 | Discharge: 2023-07-13 | Disposition: A | Discharge status: Home / Self Care | Source: Ambulatory Visit | Attending: Orthopaedic Surgery | Admitting: Orthopaedic Surgery

## 2023-07-13 ENCOUNTER — Telehealth: Payer: Self-pay | Admitting: Orthopaedic Surgery

## 2023-07-13 DIAGNOSIS — S86811A Strain of other muscle(s) and tendon(s) at lower leg level, right leg, initial encounter: Secondary | ICD-10-CM

## 2023-07-13 MED ORDER — METHOCARBAMOL 750 MG PO TABS: 750.0000 mg | ORAL_TABLET | Freq: Two times a day (BID) | ORAL | 2 refills | Status: DC | PRN

## 2023-07-13 MED ORDER — HYDROCODONE-ACETAMINOPHEN 5-325 MG PO TABS: 1.0000 | ORAL_TABLET | Freq: Three times a day (TID) | ORAL | 0 refills | Status: DC | PRN

## 2023-07-13 MED ORDER — ONDANSETRON HCL 4 MG PO TABS: 4.0000 mg | ORAL_TABLET | Freq: Three times a day (TID) | ORAL | 0 refills | Status: DC | PRN

## 2023-07-13 NOTE — Telephone Encounter (Signed)
 Patient is scheduled for right patella tendon repair 07-21-23 at [facility].  He would like to know if there is something stronger than the Hydrocodone  that was called in.  Patient uses [facility].

## 2023-07-13 NOTE — Telephone Encounter (Signed)
 No, but I also sent in a muscle relaxer

## 2023-07-13 NOTE — Telephone Encounter (Signed)
 I'm pretty sure he was prescribed hydrocodone  from the ED.  If he hasn't gotten it then he should fill it.  Thanks.

## 2023-07-13 NOTE — Telephone Encounter (Signed)
 Notified patient. He is needing something for pain now. Patient is going to pick up hydrocodone .

## 2023-07-14 ENCOUNTER — Telehealth: Payer: Self-pay | Admitting: Internal Medicine

## 2023-07-14 ENCOUNTER — Telehealth: Payer: Self-pay | Admitting: Orthopaedic Surgery

## 2023-07-14 NOTE — Telephone Encounter (Signed)
 Order was submitted via Parach[employer]. Patient notified.

## 2023-07-14 NOTE — Telephone Encounter (Signed)
 Louanna Rouse request a call back with the name of the place where the Rx was sent for the bed side toilet

## 2023-07-14 NOTE — Telephone Encounter (Signed)
Yes, he was.

## 2023-07-14 NOTE — Telephone Encounter (Signed)
 Pt mother n law called requesting a bedside commode for him. They state he is having surgery 5/1 but he can't make it upstairs due to nee not being able to bend. Pt need a commode for bed side downstairs. They are unable to get him upstairs to bathroom. Please send prescription and call pt;s mother Dale Fitzgerald [phone].

## 2023-07-15 ENCOUNTER — Telehealth: Payer: Self-pay | Admitting: Orthopaedic Surgery

## 2023-07-15 NOTE — Telephone Encounter (Signed)
 Spoke with patient. It was sent to [facility]. They have reached out to him via text about tracking and delivery.

## 2023-07-15 NOTE — Telephone Encounter (Signed)
 Ordered Wheelchair with Parachute from [employer] and notified patient.

## 2023-07-15 NOTE — Telephone Encounter (Signed)
 Patient mom called and ask where did you sent the order too for the toilet because [facility] said it would have to be a medical supply store. Also he needs a order for wheelchair. He can't get around like that. CB#[phone] or CB#[phone] She said please and thank you.

## 2023-07-20 ENCOUNTER — Other Ambulatory Visit: Payer: Self-pay | Admitting: Physician Assistant

## 2023-07-20 MED ORDER — HYDROCODONE-ACETAMINOPHEN 5-325 MG PO TABS: 1.0000 | ORAL_TABLET | Freq: Three times a day (TID) | ORAL | 0 refills | Status: DC | PRN

## 2023-07-21 ENCOUNTER — Other Ambulatory Visit: Payer: Self-pay | Admitting: Physician Assistant

## 2023-07-21 DIAGNOSIS — S86811A Strain of other muscle(s) and tendon(s) at lower leg level, right leg, initial encounter: Secondary | ICD-10-CM | POA: Diagnosis not present

## 2023-07-21 MED ORDER — DIAZEPAM 5 MG PO TABS: 5.0000 mg | ORAL_TABLET | Freq: Four times a day (QID) | ORAL | 0 refills | Status: DC | PRN

## 2023-07-22 ENCOUNTER — Telehealth: Payer: Self-pay | Admitting: Orthopaedic Surgery

## 2023-07-22 NOTE — Telephone Encounter (Signed)
 Pt is requesting a stronger medication. po

## 2023-07-25 ENCOUNTER — Telehealth: Payer: Self-pay | Admitting: Orthopaedic Surgery

## 2023-07-25 ENCOUNTER — Other Ambulatory Visit: Payer: Self-pay | Admitting: Physician Assistant

## 2023-07-25 MED ORDER — OXYCODONE-ACETAMINOPHEN 5-325 MG PO TABS: 1.0000 | ORAL_TABLET | Freq: Three times a day (TID) | ORAL | 0 refills | Status: DC | PRN

## 2023-07-25 NOTE — Telephone Encounter (Signed)
 Refaxed [employer]lyce [employer] forms completed on 4/25 [phone]

## 2023-07-25 NOTE — Telephone Encounter (Signed)
 Sent in ONE rx for oxy to be taken INSTEAD of norco

## 2023-07-25 NOTE — Telephone Encounter (Signed)
 Called and spoke with patient. He wants to make sure that we received his disability paperwork for his job. He faxed this over this past Friday to the fax #[phone]. Can you call and confirm with patient that you received it?

## 2023-07-25 NOTE — Telephone Encounter (Signed)
 IC, spoke to patient. Advised of form fee and auth to complete and sign. He asked that I email auth to him and his fiance will come in today to pay the form fee. I advised patient that I will try to complete the forms today.

## 2023-07-28 ENCOUNTER — Encounter: Admitting: Physician Assistant

## 2023-07-29 ENCOUNTER — Ambulatory Visit (INDEPENDENT_AMBULATORY_CARE_PROVIDER_SITE_OTHER): Admitting: Physician Assistant

## 2023-07-29 DIAGNOSIS — S86811A Strain of other muscle(s) and tendon(s) at lower leg level, right leg, initial encounter: Secondary | ICD-10-CM

## 2023-07-29 MED ORDER — APIXABAN 2.5 MG PO TABS: ORAL_TABLET | ORAL | 0 refills | Status: DC

## 2023-07-29 MED ORDER — METHOCARBAMOL 750 MG PO TABS: 750.0000 mg | ORAL_TABLET | Freq: Three times a day (TID) | ORAL | 0 refills | Status: DC | PRN

## 2023-07-29 MED ORDER — HYDROCODONE-ACETAMINOPHEN 5-325 MG PO TABS: 1.0000 | ORAL_TABLET | Freq: Three times a day (TID) | ORAL | 0 refills | Status: DC | PRN

## 2023-07-29 NOTE — Progress Notes (Signed)
   Post-Op Visit Note   Patient: Dale Fitzgerald           Date of Birth: 04-26-1985           MRN: [id number] Visit Date: 07/29/2023 PCP: Patient, No Pcp Per   Assessment & Plan:  Chief Complaint:  Chief Complaint  Patient presents with   Right Knee - Pain   Visit Diagnoses:  1. Patellar tendon rupture, right, initial encounter     Plan: Patient is a pleasant 38 year old gentleman who comes in today 1 week status post right patellar tendon repair 07/21/2023.  He is still complaining of a fair amount of pain to the knee.  He is taking Percocet, Robaxin , Tylenol  and ibuprofen and tells me this does not help.  He has been icing and elevating.  He has been compliant wearing his hinged knee brace from 0 to 30 degrees.  It does not sound like he is getting up walking around very much.  Examination of his right knee reveals a well-healing surgical incision with nylon sutures in place.  No evidence of infection or cellulitis.  Calf is soft and nontender.  He is neurovascularly intact distally.  At this point, I have discussed the need to get up and move around is much as possible in his brace as he is at an increased risk of developing blood clots not moving around much.  I have sent in Eliquis to take twice daily for 4 weeks.  I have also refilled his Robaxin  and sent in Norco.  Continue to ice and elevate.  Continue with the hinged knee brace from 0-30.  Follow-up next week for suture removal.  Anticipate starting PT and increasing flexion to 60 degrees at 4 weeks postop.  Call with concerns or questions in the meantime.  Follow-Up Instructions: Return in about 1 week (around 08/05/2023).   Orders:  No orders of the defined types were placed in this encounter.  No orders of the defined types were placed in this encounter.   Imaging: No new imaging  PMFS History: Patient Active Problem List   Diagnosis Date Noted   Patellar tendon rupture, right, initial encounter 07/12/2023   Depression     Past Medical History:  Diagnosis Date   Anxiety    Hypertension     No family history on file.  Past Surgical History:  Procedure Laterality Date   APPENDECTOMY     Social History   Occupational History   Not on file  Tobacco Use   Smoking status: Every Day    Current packs/day: 0.50    Average packs/day: 0.5 packs/day for 10.0 years (5.0 ttl pk-yrs)    Types: Cigarettes   Smokeless tobacco: Not on file  Vaping Use   Vaping status: Never Used  Substance and Sexual Activity   Alcohol use: Yes   Drug use: Yes    Types: Marijuana   Sexual activity: Not on file

## 2023-08-05 ENCOUNTER — Ambulatory Visit: Admitting: Physician Assistant

## 2023-08-05 DIAGNOSIS — S86811A Strain of other muscle(s) and tendon(s) at lower leg level, right leg, initial encounter: Secondary | ICD-10-CM

## 2023-08-05 MED ORDER — METHOCARBAMOL 750 MG PO TABS: 750.0000 mg | ORAL_TABLET | Freq: Two times a day (BID) | ORAL | 0 refills | Status: DC | PRN

## 2023-08-05 MED ORDER — HYDROCODONE-ACETAMINOPHEN 5-325 MG PO TABS: 1.0000 | ORAL_TABLET | Freq: Two times a day (BID) | ORAL | 0 refills | Status: DC | PRN

## 2023-08-05 NOTE — Progress Notes (Signed)
   Post-Op Visit Note   Patient: Dale Fitzgerald           Date of Birth: [birth year]           MRN: [id number] Visit Date: 08/05/2023 PCP: Patient, No Pcp Per   Assessment & Plan:  Chief Complaint:  Chief Complaint  Patient presents with   Right Knee - Pain   Visit Diagnoses:  1. Patellar tendon rupture, right, initial encounter     Plan: Patient is a pleasant 38 year old gentleman who comes in today 2 weeks status post right patellar tendon repair, date of surgery 07/21/2023.  He is here today for suture removal.  He is doing better.  He has been taking Eliquis twice daily for DVT prophylaxis.  He has been in a hinged knee brace locked from 0 to 30 degrees.  Examination of the right knee reveals a fully healed surgical incision with nylon sutures in place.  No evidence of infection or cellulitis.  He does have mild swelling to the right lower extremity.  Calf is soft nontender.  He is neurovascular intact distally.  Today, sutures were removed and Steri-Strips applied.  Continue wearing his hinged knee brace from 0 to 30 degrees.  Follow-up in 2 weeks for repeat evaluation.  At that point, anticipate starting physical therapy and increasing flexion to 60 degrees.  Follow-Up Instructions: Return in about 2 weeks (around 08/19/2023).   Orders:  No orders of the defined types were placed in this encounter.  Meds ordered this encounter  Medications   HYDROcodone -acetaminophen  (NORCO/VICODIN) 5-325 MG tablet    Sig: Take 1 tablet by mouth 2 (two) times daily as needed for moderate pain (pain score 4-6).    Dispense:  20 tablet    Refill:  0   methocarbamol  (ROBAXIN -750) 750 MG tablet    Sig: Take 1 tablet (750 mg total) by mouth 2 (two) times daily as needed for muscle spasms.    Dispense:  20 tablet    Refill:  0    Imaging: No new imaging  PMFS History: Patient Active Problem List   Diagnosis Date Noted   Patellar tendon rupture, right, initial encounter 07/12/2023   Depression     Past Medical History:  Diagnosis Date   Anxiety    Hypertension     No family history on file.  Past Surgical History:  Procedure Laterality Date   APPENDECTOMY     Social History   Occupational History   Not on file  Tobacco Use   Smoking status: Every Day    Current packs/day: 0.50    Average packs/day: 0.5 packs/day for 10.0 years (5.0 ttl pk-yrs)    Types: Cigarettes   Smokeless tobacco: Not on file  Vaping Use   Vaping status: Never Used  Substance and Sexual Activity   Alcohol use: Yes   Drug use: Yes    Types: Marijuana   Sexual activity: Not on file

## 2023-08-10 ENCOUNTER — Telehealth: Payer: Self-pay | Admitting: Orthopaedic Surgery

## 2023-08-10 NOTE — Telephone Encounter (Signed)
 status post right patellar tendon repair, date of surgery 07/21/2023  Experiencing a "shock like feeling" on the front of his knee. He said that he was experiencing this at his last visit, but it is getting worse. He has not noticed that anything he does causes it.  He is taking Hydrocodone  and Methocarbamol . He said that his physical therapist told him to call.

## 2023-08-10 NOTE — Telephone Encounter (Signed)
 Patient called. He would like someone to call him. Says the pain is feeling like a sharp and shock pain. His call back number is [phone]

## 2023-08-10 NOTE — Telephone Encounter (Signed)
 Spoke with patient and relayed message.

## 2023-08-10 NOTE — Telephone Encounter (Signed)
 That's normal part of healing from surgery.  Nerves healing.

## 2023-08-23 ENCOUNTER — Ambulatory Visit (INDEPENDENT_AMBULATORY_CARE_PROVIDER_SITE_OTHER): Admitting: Physician Assistant

## 2023-08-23 DIAGNOSIS — S86811A Strain of other muscle(s) and tendon(s) at lower leg level, right leg, initial encounter: Secondary | ICD-10-CM

## 2023-08-23 NOTE — Progress Notes (Signed)
   Post-Op Visit Note   Patient: Dale Fitzgerald           Date of Birth: 02/07/1986           MRN: [id number] Visit Date: 08/23/2023 PCP: Patient, No Pcp Per   Assessment & Plan:  Chief Complaint:  Chief Complaint  Patient presents with   Right Knee - Routine Post Op   Visit Diagnoses:  1. Patellar tendon rupture, right, initial encounter     Plan: Patient is a pleasant 38 year old gentleman who comes in today little over 4 weeks out right patellar tendon repair, date of surgery 07/21/2023.  He has been doing much better.  He notes some discomfort which is relieved with over-the-counter pain medication.  He has been compliant wearing his hinged knee brace locked from 0 to 30 degrees.  He has been taking Eliquis  for DVT prophylaxis.  Examination of the right knee shows a fully healed surgical scar without complication.  Calf is soft nontender.  He is neurovascularly intact distally.  Today, I unlocked his brace to 60 degrees of flexion.  He will continue with this for another 4 weeks.  I have sent in a referral for outpatient physical therapy.  He will follow-up in 4 weeks for repeat evaluation.  At that point, we will unlock his brace to 90 degrees of flexion.  He will call with concerns or questions in the meantime.  Follow-Up Instructions: Return in about 4 weeks (around 09/20/2023).   Orders:  Orders Placed This Encounter  Procedures   Ambulatory referral to Physical Therapy   No orders of the defined types were placed in this encounter.   Imaging: No new imaging  PMFS History: Patient Active Problem List   Diagnosis Date Noted   Patellar tendon rupture, right, initial encounter 07/12/2023   Depression    Past Medical History:  Diagnosis Date   Anxiety    Hypertension     No family history on file.  Past Surgical History:  Procedure Laterality Date   APPENDECTOMY     Social History   Occupational History   Not on file  Tobacco Use   Smoking status: Every Day     Current packs/day: 0.50    Average packs/day: 0.5 packs/day for 10.0 years (5.0 ttl pk-yrs)    Types: Cigarettes   Smokeless tobacco: Not on file  Vaping Use   Vaping status: Never Used  Substance and Sexual Activity   Alcohol use: Yes   Drug use: Yes    Types: Marijuana   Sexual activity: Not on file

## 2023-08-24 NOTE — Therapy (Signed)
 OUTPATIENT PHYSICAL THERAPY EVALUATION   Patient Name: Dale Fitzgerald MRN: [id number] DOB:February 18, 1986, 38 y.o., male Today's Date: 08/25/2023  END OF SESSION:  PT End of Session - 08/25/23 1256     Visit Number 1    Number of Visits 20    Date for PT Re-Evaluation 11/03/23    Authorization Type [employer] $40 copay    Progress Note Due on Visit 10    PT Start Time 1258    PT Stop Time 1338    PT Time Calculation (min) 40 min    Activity Tolerance Patient limited by pain    Behavior During Therapy [facility] for tasks assessed/performed             Past Medical History:  Diagnosis Date   Anxiety    Hypertension    Past Surgical History:  Procedure Laterality Date   APPENDECTOMY     Patient Active Problem List   Diagnosis Date Noted   Patellar tendon rupture, right, initial encounter 07/12/2023   Depression     PCP: No provider listed in epic.   REFERRING PROVIDER: Leigh Punch  REFERRING DIAG: [phone] (ICD-10-CM) - Patellar tendon rupture, right, initial encounter  THERAPY DIAG:  Acute pain of right knee  Muscle weakness (generalized)  Stiffness of right knee, not elsewhere classified  Difficulty in walking, not elsewhere classified  Localized edema  Rationale for Evaluation and Treatment: Rehabilitation  ONSET DATE: Surgery 07/21/2023  SUBJECTIVE:   SUBJECTIVE STATEMENT: Pt came to clinic s/p recent Rt patellar tendon rupture repair. Pt indicated injury occurred in April while playing basketball.  Had home therapy until yesterday discharge.  Pt indicated variable pain symptoms during the day with some trouble at night reported.  Reported using bilateral crutches at this time.   PERTINENT HISTORY: Had been wearing hinge brace 0-30 degrees prior to 08/23/2023 PA visit.  Plan for follow up in 4 weeks for brace unlocking to 90 deg.  Currently set from 0-60.    PAIN:  NPRS scale: up to 6 /10 Pain location: Rt knee  Pain description: shock, tight Aggravating  factors: end ranges, WB pressure Relieving factors: has used to heat.   PRECAUTIONS: Knee - No in house protocol directly provided.  Respected protocol from [facility] in folder bin in clinic.   WEIGHT BEARING RESTRICTIONS: Brace 0-60 from 08/23/2023 until return to PA in approx. 4 weeks.   FALLS:  Has patient fallen in last 6 months? Yes. Number of falls 1 - current injury  LIVING ENVIRONMENT: Lives in: House/apartment Stairs: Flight of stairs in house.    Outside small step for porch without rail Has following equipment at home: Bilateral crutches, locking stairs.   OCCUPATION: Has worked 3 jobs. 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   PLOF: Independent, basketball recreationally, playing sports with son.    PATIENT GOALS: Reduce pain, get back to work/life. Get stronger  OBJECTIVE:   PATIENT SURVEYS:  Patient-Specific Activity Scoring Scheme  "0" represents "unable to perform." "10" represents "able to perform at prior level. 0 1 2 3 4 5 6 7 8 9  10 (Date and Score)   Activity Eval  08/25/2023    1. Walking independently  0    2. Stairs  4    3. work 0   4.Recreational sport 0   5.    Score 1 avg    Total score = sum of the activity  scores/number of activities Minimum detectable change (90%CI) for average score = 2 points Minimum detectable change (90%CI) for single activity score = 3 points  COGNITION: 08/25/2023 Overall cognitive status: WFL    SENSATION: 08/25/2023 WFL  EDEMA:  08/25/2023 Localized edema Rt knee  MUSCLE LENGTH: 08/25/2023 No specific testing.   POSTURE:  08/25/2023 Unremarkable   PALPATION: 08/25/2023 Mild tenderness around incision.   LOWER EXTREMITY ROM:   ROM Right Eval 08/25/2023 Left Eval 08/25/2023  Hip flexion    Hip extension    Hip abduction    Hip adduction    Hip internal rotation    Hip external rotation    Knee flexion 60 AROM in supine heel slide with pain   Knee extension  -16 AROM in seated LAQ  -9 in supine heel prop   Ankle dorsiflexion    Ankle plantarflexion    Ankle inversion    Ankle eversion     (Blank rows = not tested)  LOWER EXTREMITY MMT:  MMT Right Eval 08/25/2023 Left Eval 08/25/2023  Hip flexion 3+/5 5/5  Hip extension    Hip abduction    Hip adduction    Hip internal rotation    Hip external rotation    Knee flexion 4/5 5/5  Knee extension NT 5/5  Ankle dorsiflexion 5/5   Ankle plantarflexion    Ankle inversion    Ankle eversion     (Blank rows = not tested)  LOWER EXTREMITY SPECIAL TESTS:  08/25/2023 No specific testing.   FUNCTIONAL TESTS:  08/25/2023 18 inch chair transfer: Unable unassist  Lt SLS: not tested Rt SLS: unable   GAIT: 08/25/2023 Hinge brace 0-60 degrees with bilateral axillary crutches.  Step to pattern primarily with partial WB noted on Rt leg. Associated deviations due to brace locking at 60 deg (hip circumduction, hike etc)                                                                                                                                                                        TODAY'S TREATMENT                                                                          DATE: 08/25/2023 Therex:    HEP instruction/performance c cues for techniques, handout provided.  Trial set performed of each for comprehension and symptom assessment.  See below for exercise list  Self Care Education and cues on edema control with elevation, icing.  Advised not falling asleep with heating pad on to avoid burns/fire risk.  Encouraged positioning without slight knee flexion.   Vaso 10 mins Rt knee in elevation 34 deg medium compression for swelling.   PATIENT EDUCATION:  08/25/2023 Education details: HEP, POC Person educated: Patient Education method: Programmer, multimedia, Demonstration, Verbal cues, and Handouts Education comprehension: verbalized understanding, returned demonstration, and verbal cues required  HOME  EXERCISE PROGRAM: Access Code: [email or web address] URL: [email or web address] Date: 08/25/2023 Prepared by: Bonna Bustard  Exercises - Supine Heel Slide  - 3-5 x daily - 7 x weekly - 1 sets - 10 reps - 5 hold - Seated Long Arc Quad  - 3-5 x daily - 7 x weekly - 1 sets - 5-10 reps - 2 hold - Supine Knee Extension Mobilization with Weight  - 4-5 x daily - 7 x weekly - 1 sets - 1 reps - to tolerance up to 5 mins hold - Supine Quadricep Sets  - 3-5 x daily - 7 x weekly - 1 sets - 10 reps - 5 hold - Seated Quad Set  - 3-5 x daily - 7 x weekly - 1 sets - 10 reps - 5 hold  ASSESSMENT:  CLINICAL IMPRESSION: Patient is a 38 y.o. who comes to clinic with complaints of Rt knee pain s/p recent patellar tendon rupture with surgery on 07/21/2023 with mobility, strength and movement coordination deficits that impair their ability to perform usual daily and recreational functional activities without increase difficulty/symptoms at this time.  Patient to benefit from skilled PT services to address impairments and limitations to improve to previous level of function without restriction secondary to condition.   OBJECTIVE IMPAIRMENTS: Abnormal gait, decreased activity tolerance, decreased balance, decreased coordination, decreased endurance, decreased mobility, difficulty walking, decreased ROM, decreased strength, hypomobility, increased edema, increased fascial restrictions, impaired perceived functional ability, increased muscle spasms, impaired flexibility, improper body mechanics, and pain.   ACTIVITY LIMITATIONS: carrying, lifting, bending, sitting, standing, squatting, sleeping, stairs, transfers, bed mobility, and locomotion level  PARTICIPATION LIMITATIONS: meal prep, cleaning, laundry, interpersonal relationship, driving, shopping, community activity, occupation, and yard work  PERSONAL FACTORS: Anxiety, HTN are also affecting patient's functional outcome.   REHAB POTENTIAL: Good  CLINICAL  DECISION MAKING: Stable/uncomplicated  EVALUATION COMPLEXITY: Low   GOALS: Goals reviewed with patient? Yes  SHORT TERM GOALS: (target date for Short term goals are 3 weeks 09/15/2023)   1.  Patient will demonstrate independent use of home exercise program to maintain progress from in clinic treatments.  Goal status: New  LONG TERM GOALS: (target dates for all long term goals are 10 weeks  11/03/2023 )   1. Patient will demonstrate/report pain at worst less than or equal to 2/10 to facilitate minimal limitation in daily activity secondary to pain symptoms.  Goal status: New   2. Patient will demonstrate independent use of home exercise program to facilitate ability to maintain/progress functional gains from skilled physical therapy services.  Goal status: New   3. Patient will demonstrate Patient specific functional scale avg > or = 8/10 to indicate reduced disability due to condition.   Goal status: New   4.  Patient will demonstrate Rt LE MMT 5/5, dynamometry within 15 % of Lt for knee extension to faciltiate usual transfers, stairs, squatting at PLOF for daily life.   (No testing on eval for dynamometry due to surgical protocol)  Goal status: New   5.  Patient will demonstrate independent ambulation > 500 ft for community integration at [facility].  Goal status:  New   6.  Patient will demonstrate ascending/descending stairs reciprocally s UE assist for community integration.   Goal status: New   7.  Patient will demonstrate SLS bilateral > 30 seconds for stability in ambualtion.  Goal Status: New   PLAN:  PT FREQUENCY: 2x/week  PT DURATION: 10 weeks  PLANNED INTERVENTIONS: Can include 09811- PT Re-evaluation, 97110-Therapeutic exercises, 97530- Therapeutic activity, 97112- Neuromuscular re-education, 97535- Self Care, 97140- Manual therapy, [phone]- Gait training, [phone]- Orthotic Fit/training, [phone]- Canalith repositioning, [id number]- Aquatic Therapy, [phone]- Electrical  stimulation (unattended), [phone]- Electrical stimulation (manual), [id number] Physical performance testing, 97016- Vasopneumatic device, [id number]- Ultrasound, [id number]- Traction (mechanical), [id number]- Ionotophoresis 4mg /ml Dexamethasone , Patient/Family education, Balance training, Stair training, Taping, Dry Needling, Joint mobilization, Joint manipulation, Spinal manipulation, Spinal mobilization, Scar mobilization, Vestibular training, Visual/preceptual remediation/compensation, DME instructions, Cryotherapy, and Moist heat.  All performed as medically necessary.  All included unless contraindicated  PLAN FOR NEXT SESSION: Review HEP knowledge/results.   Early quad activation, isometrics in various knee 0-60 deg flexion positioning.  Improved qualty of range in available range in brace.  Early static balance.   NO loaded CKC with weight at this time, no weight loaded OKC extension per protocol recommendations.   Hinge brace 0-60 degrees from PA visit on 08/23/2023 until return visit in approx. 4 weeks.  No in house protocol directly provided.  Respected protocol from [facility] in folder bin in clinic.    Bonna Bustard, PT, DPT, OCS, ATC 08/25/23  1:49 PM

## 2023-08-25 ENCOUNTER — Other Ambulatory Visit: Payer: Self-pay | Admitting: Physician Assistant

## 2023-08-25 ENCOUNTER — Encounter: Payer: Self-pay | Admitting: Rehabilitative and Restorative Service Providers"

## 2023-08-25 ENCOUNTER — Ambulatory Visit (INDEPENDENT_AMBULATORY_CARE_PROVIDER_SITE_OTHER): Admitting: Rehabilitative and Restorative Service Providers"

## 2023-08-25 DIAGNOSIS — M6281 Muscle weakness (generalized): Secondary | ICD-10-CM | POA: Diagnosis not present

## 2023-08-25 DIAGNOSIS — M25561 Pain in right knee: Secondary | ICD-10-CM

## 2023-08-25 DIAGNOSIS — R262 Difficulty in walking, not elsewhere classified: Secondary | ICD-10-CM | POA: Diagnosis not present

## 2023-08-25 DIAGNOSIS — M25661 Stiffness of right knee, not elsewhere classified: Secondary | ICD-10-CM

## 2023-08-25 DIAGNOSIS — R600 Localized edema: Secondary | ICD-10-CM

## 2023-08-26 ENCOUNTER — Ambulatory Visit: Admitting: Physical Therapy

## 2023-08-26 ENCOUNTER — Encounter: Payer: Self-pay | Admitting: Physical Therapy

## 2023-08-26 DIAGNOSIS — M6281 Muscle weakness (generalized): Secondary | ICD-10-CM | POA: Diagnosis not present

## 2023-08-26 DIAGNOSIS — M25561 Pain in right knee: Secondary | ICD-10-CM | POA: Diagnosis not present

## 2023-08-26 DIAGNOSIS — R262 Difficulty in walking, not elsewhere classified: Secondary | ICD-10-CM | POA: Diagnosis not present

## 2023-08-26 DIAGNOSIS — M25661 Stiffness of right knee, not elsewhere classified: Secondary | ICD-10-CM | POA: Diagnosis not present

## 2023-08-26 DIAGNOSIS — R600 Localized edema: Secondary | ICD-10-CM

## 2023-08-26 NOTE — Therapy (Signed)
 OUTPATIENT PHYSICAL THERAPY TREATMENT    Patient Name: Dale Fitzgerald MRN: [id number] DOB:10/13/1985, 38 y.o., male Today's Date: 08/26/2023  END OF SESSION:  PT End of Session - 08/26/23 1549     Visit Number 2    Number of Visits 20    Date for PT Re-Evaluation 11/03/23    Authorization Type [employer] $40 copay    Progress Note Due on Visit 10    PT Start Time 1525   late arrival   PT Stop Time 1602    PT Time Calculation (min) 37 min    Activity Tolerance Patient limited by pain    Behavior During Therapy [facility] for tasks assessed/performed              Past Medical History:  Diagnosis Date   Anxiety    Hypertension    Past Surgical History:  Procedure Laterality Date   APPENDECTOMY     Patient Active Problem List   Diagnosis Date Noted   Patellar tendon rupture, right, initial encounter 07/12/2023   Depression     PCP: No provider listed in epic.   REFERRING PROVIDER: Leigh Punch  REFERRING DIAG: [phone] (ICD-10-CM) - Patellar tendon rupture, right, initial encounter  THERAPY DIAG:  Acute pain of right knee  Muscle weakness (generalized)  Stiffness of right knee, not elsewhere classified  Difficulty in walking, not elsewhere classified  Localized edema  Rationale for Evaluation and Treatment: Rehabilitation  ONSET DATE: Surgery 07/21/2023  SUBJECTIVE:   SUBJECTIVE STATEMENT:   Not much new since yesterday, HEP is going well     EVAL:Pt came to clinic s/p recent Rt patellar tendon rupture repair. Pt indicated injury occurred in April while playing basketball.  Had home therapy until yesterday discharge.  Pt indicated variable pain symptoms during the day with some trouble at night reported.  Reported using bilateral crutches at this time.   PERTINENT HISTORY: Had been wearing hinge brace 0-30 degrees prior to 08/23/2023 PA visit.  Plan for follow up in 4 weeks for brace unlocking to 90 deg.  Currently set from 0-60.    PAIN:  NPRS scale:  1-2/10 Pain location: Rt knee  Pain description: shock, tight Aggravating factors: end ranges, WB pressure Relieving factors: has used heat but not   PRECAUTIONS: Knee - No in house protocol directly provided.  Respected protocol from [facility] in folder bin in clinic.   WEIGHT BEARING RESTRICTIONS: Brace 0-60 from 08/23/2023 until return to PA in approx. 4 weeks.   FALLS:  Has patient fallen in last 6 months? Yes. Number of falls 1 - current injury  LIVING ENVIRONMENT: Lives in: House/apartment Stairs: Flight of stairs in house.    Outside small step for porch without rail Has following equipment at home: Bilateral crutches, locking stairs.   OCCUPATION: Has worked 3 jobs. 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   PLOF: Independent, basketball recreationally, playing sports with son.    PATIENT GOALS: Reduce pain, get back to work/life. Get stronger  OBJECTIVE:   PATIENT SURVEYS:  Patient-Specific Activity Scoring Scheme  "0" represents "unable to perform." "10" represents "able to perform at prior level. 0 1 2 3 4 5 6 7 8 9  10 (Date and Score)   Activity Eval  08/25/2023    1. Walking independently  0    2. Stairs  4    3. work 0   4.Recreational sport 0  5.    Score 1 avg    Total score = sum of the activity scores/number of activities Minimum detectable change (90%CI) for average score = 2 points Minimum detectable change (90%CI) for single activity score = 3 points  COGNITION: 08/25/2023 Overall cognitive status: WFL    SENSATION: 08/25/2023 WFL  EDEMA:  08/25/2023 Localized edema Rt knee  MUSCLE LENGTH: 08/25/2023 No specific testing.   POSTURE:  08/25/2023 Unremarkable   PALPATION: 08/25/2023 Mild tenderness around incision.   LOWER EXTREMITY ROM:   ROM Right Eval 08/25/2023 Left Eval 08/25/2023  Hip flexion    Hip extension    Hip abduction    Hip adduction    Hip internal rotation    Hip external  rotation    Knee flexion 60 AROM in supine heel slide with pain   Knee extension -16 AROM in seated LAQ  -9 in supine heel prop   Ankle dorsiflexion    Ankle plantarflexion    Ankle inversion    Ankle eversion     (Blank rows = not tested)  LOWER EXTREMITY MMT:  MMT Right Eval 08/25/2023 Left Eval 08/25/2023  Hip flexion 3+/5 5/5  Hip extension    Hip abduction    Hip adduction    Hip internal rotation    Hip external rotation    Knee flexion 4/5 5/5  Knee extension NT 5/5  Ankle dorsiflexion 5/5   Ankle plantarflexion    Ankle inversion    Ankle eversion     (Blank rows = not tested)  LOWER EXTREMITY SPECIAL TESTS:  08/25/2023 No specific testing.   FUNCTIONAL TESTS:  08/25/2023 18 inch chair transfer: Unable unassist  Lt SLS: not tested Rt SLS: unable   GAIT: 08/25/2023 Hinge brace 0-60 degrees with bilateral axillary crutches.  Step to pattern primarily with partial WB noted on Rt leg. Associated deviations due to brace locking at 60 deg (hip circumduction, hike etc)                                                                                                                                                                        TODAY'S TREATMENT                                                                          DATE:   08/26/23   Medial-lateral patella mobs per protocol  Knee extension overpressure stretches with bolster under heel   Checked quad set form  for HEP/cues to reduce glute activation, then 10x3 second holds SLRs 2x10 0#  SAQs 2x10 0#  Sidelying hip ABD 2x10 0#  Adjusted brace/educated on proper fit, educated on expected course of recovery after patellar tendon recovery and time needed for full recovery especially with soft tissue surgeries/injuries      08/25/2023 Therex:    HEP instruction/performance c cues for techniques, handout provided.  Trial set performed of each for comprehension and symptom assessment.  See below for exercise  list  Self Care Education and cues on edema control with elevation, icing.  Advised not falling asleep with heating pad on to avoid burns/fire risk.  Encouraged positioning without slight knee flexion.   Vaso 10 mins Rt knee in elevation 34 deg medium compression for swelling.   PATIENT EDUCATION:  08/25/2023 Education details: HEP, POC Person educated: Patient Education method: Programmer, multimedia, Demonstration, Verbal cues, and Handouts Education comprehension: verbalized understanding, returned demonstration, and verbal cues required  HOME EXERCISE PROGRAM: Access Code: [email or web address] URL: [email or web address] Date: 08/25/2023 Prepared by: Bonna Bustard  Exercises - Supine Heel Slide  - 3-5 x daily - 7 x weekly - 1 sets - 10 reps - 5 hold - Seated Long Arc Quad  - 3-5 x daily - 7 x weekly - 1 sets - 5-10 reps - 2 hold - Supine Knee Extension Mobilization with Weight  - 4-5 x daily - 7 x weekly - 1 sets - 1 reps - to tolerance up to 5 mins hold - Supine Quadricep Sets  - 3-5 x daily - 7 x weekly - 1 sets - 10 reps - 5 hold - Seated Quad Set  - 3-5 x daily - 7 x weekly - 1 sets - 10 reps - 5 hold  ASSESSMENT:  CLINICAL IMPRESSION:  Doing OK- we worked on quad activation as per protocol, tolerated activities well today but patella is very stiff and immobile. Did not attempt superior-inferior mobilization today per protocol. He was able to complete all activities well without significant quad lag today, did have some slight increased knee pain at EOS. Adjusted fit of brace and educated on appropriate fit/dials should be at joint line of knee/not below.     EVAL: Patient is a 38 y.o. who comes to clinic with complaints of Rt knee pain s/p recent patellar tendon rupture with surgery on 07/21/2023 with mobility, strength and movement coordination deficits that impair their ability to perform usual daily and recreational functional activities without increase difficulty/symptoms at this  time.  Patient to benefit from skilled PT services to address impairments and limitations to improve to previous level of function without restriction secondary to condition.   OBJECTIVE IMPAIRMENTS: Abnormal gait, decreased activity tolerance, decreased balance, decreased coordination, decreased endurance, decreased mobility, difficulty walking, decreased ROM, decreased strength, hypomobility, increased edema, increased fascial restrictions, impaired perceived functional ability, increased muscle spasms, impaired flexibility, improper body mechanics, and pain.   ACTIVITY LIMITATIONS: carrying, lifting, bending, sitting, standing, squatting, sleeping, stairs, transfers, bed mobility, and locomotion level  PARTICIPATION LIMITATIONS: meal prep, cleaning, laundry, interpersonal relationship, driving, shopping, community activity, occupation, and yard work  PERSONAL FACTORS: Anxiety, HTN are also affecting patient's functional outcome.   REHAB POTENTIAL: Good  CLINICAL DECISION MAKING: Stable/uncomplicated  EVALUATION COMPLEXITY: Low   GOALS: Goals reviewed with patient? Yes  SHORT TERM GOALS: (target date for Short term goals are 3 weeks 09/15/2023)   1.  Patient will demonstrate independent use of home exercise program to maintain progress from in clinic treatments.  Goal status: New  LONG TERM GOALS: (target dates for all long term goals are 10 weeks  11/03/2023 )   1. Patient will demonstrate/report pain at worst less than or equal to 2/10 to facilitate minimal limitation in daily activity secondary to pain symptoms.  Goal status: New   2. Patient will demonstrate independent use of home exercise program to facilitate ability to maintain/progress functional gains from skilled physical therapy services.  Goal status: New   3. Patient will demonstrate Patient specific functional scale avg > or = 8/10 to indicate reduced disability due to condition.   Goal status: New   4.  Patient  will demonstrate Rt LE MMT 5/5, dynamometry within 15 % of Lt for knee extension to faciltiate usual transfers, stairs, squatting at PLOF for daily life.   (No testing on eval for dynamometry due to surgical protocol)  Goal status: New   5.  Patient will demonstrate independent ambulation > 500 ft for community integration at [facility].  Goal status: New   6.  Patient will demonstrate ascending/descending stairs reciprocally s UE assist for community integration.   Goal status: New   7.  Patient will demonstrate SLS bilateral > 30 seconds for stability in ambualtion.  Goal Status: New   PLAN:  PT FREQUENCY: 2x/week  PT DURATION: 10 weeks  PLANNED INTERVENTIONS: Can include 16109- PT Re-evaluation, 97110-Therapeutic exercises, 97530- Therapeutic activity, 97112- Neuromuscular re-education, 97535- Self Care, 97140- Manual therapy, [phone]- Gait training, [phone]- Orthotic Fit/training, [phone]- Canalith repositioning, [id number]- Aquatic Therapy, [phone]- Electrical stimulation (unattended), [phone]- Electrical stimulation (manual), [id number] Physical performance testing, 97016- Vasopneumatic device, [id number]- Ultrasound, [id number]- Traction (mechanical), [id number]- Ionotophoresis 4mg /ml Dexamethasone , Patient/Family education, Balance training, Stair training, Taping, Dry Needling, Joint mobilization, Joint manipulation, Spinal manipulation, Spinal mobilization, Scar mobilization, Vestibular training, Visual/preceptual remediation/compensation, DME instructions, Cryotherapy, and Moist heat.  All performed as medically necessary.  All included unless contraindicated  PLAN FOR NEXT SESSION: Review HEP knowledge/results.   Early quad activation, isometrics in various knee 0-60 deg flexion positioning.  Improved qualty of range in available range in brace.  Early static balance.   NO loaded CKC with weight at this time, no weight loaded OKC extension per protocol recommendations. Continue per protocol, adjust brace fit PRN     Hinge brace 0-60 degrees from PA visit on 08/23/2023 until return visit in approx. 4 weeks.  No in house protocol directly provided.  Respected protocol from [facility] in folder bin in clinic.    Terrel Ferries, PT, DPT 08/26/23 4:06 PM

## 2023-08-29 ENCOUNTER — Ambulatory Visit: Admitting: Rehabilitative and Restorative Service Providers"

## 2023-08-29 ENCOUNTER — Encounter: Payer: Self-pay | Admitting: Rehabilitative and Restorative Service Providers"

## 2023-08-29 DIAGNOSIS — M25661 Stiffness of right knee, not elsewhere classified: Secondary | ICD-10-CM

## 2023-08-29 DIAGNOSIS — M6281 Muscle weakness (generalized): Secondary | ICD-10-CM

## 2023-08-29 DIAGNOSIS — M25561 Pain in right knee: Secondary | ICD-10-CM | POA: Diagnosis not present

## 2023-08-29 DIAGNOSIS — R262 Difficulty in walking, not elsewhere classified: Secondary | ICD-10-CM

## 2023-08-29 DIAGNOSIS — R600 Localized edema: Secondary | ICD-10-CM

## 2023-08-29 NOTE — Therapy (Signed)
 OUTPATIENT PHYSICAL THERAPY TREATMENT    Patient Name: Dale Fitzgerald MRN: [id number] DOB:29-Apr-1985, 38 y.o., male Today's Date: 08/29/2023  END OF SESSION:  PT End of Session - 08/29/23 0914     Visit Number 3    Number of Visits 20    Date for PT Re-Evaluation 11/03/23    Authorization Type [employer] $40 copay    Progress Note Due on Visit 10    PT Start Time 0847    PT Stop Time 0937    PT Time Calculation (min) 50 min    Activity Tolerance Patient limited by pain    Behavior During Therapy [facility] for tasks assessed/performed               Past Medical History:  Diagnosis Date   Anxiety    Hypertension    Past Surgical History:  Procedure Laterality Date   APPENDECTOMY     Patient Active Problem List   Diagnosis Date Noted   Patellar tendon rupture, right, initial encounter 07/12/2023   Depression     PCP: No provider listed in epic.   REFERRING PROVIDER: Leigh Punch  REFERRING DIAG: [phone] (ICD-10-CM) - Patellar tendon rupture, right, initial encounter  THERAPY DIAG:  Acute pain of right knee  Muscle weakness (generalized)  Stiffness of right knee, not elsewhere classified  Difficulty in walking, not elsewhere classified  Localized edema  Rationale for Evaluation and Treatment: Rehabilitation  ONSET DATE: Surgery 07/21/2023  SUBJECTIVE:   SUBJECTIVE STATEMENT: Pt indicated stiffness/tight mainly this morning.  Reported brace keeps siding down.    PERTINENT HISTORY: Had been wearing hinge brace 0-30 degrees prior to 08/23/2023 PA visit.  Plan for follow up in 4 weeks for brace unlocking to 90 deg.  Currently set from 0-60.    PAIN:  NPRS scale: 3/10 Pain location: Rt knee  Pain description: shock, tight Aggravating factors: end ranges, WB pressure Relieving factors: has used heat but not   PRECAUTIONS: Knee - No in house protocol directly provided.  Respected protocol from [facility] in folder bin in clinic.   WEIGHT BEARING RESTRICTIONS:  Brace 0-60 from 08/23/2023 until return to PA in approx. 4 weeks.   FALLS:  Has patient fallen in last 6 months? Yes. Number of falls 1 - current injury  LIVING ENVIRONMENT: Lives in: House/apartment Stairs: Flight of stairs in house.    Outside small step for porch without rail Has following equipment at home: Bilateral crutches, locking stairs.   OCCUPATION: Has worked 3 jobs. 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   PLOF: Independent, basketball recreationally, playing sports with son.    PATIENT GOALS: Reduce pain, get back to work/life. Get stronger  OBJECTIVE:   PATIENT SURVEYS:  Patient-Specific Activity Scoring Scheme  "0" represents "unable to perform." "10" represents "able to perform at prior level. 0 1 2 3 4 5 6 7 8 9  10 (Date and Score)   Activity Eval  08/25/2023    1. Walking independently  0    2. Stairs  4    3. work 0   4.Recreational sport 0   5.    Score 1 avg    Total score = sum of the activity scores/number of activities Minimum detectable change (90%CI) for average score = 2 points Minimum detectable change (90%CI) for single activity score = 3 points  COGNITION: 08/25/2023 Overall cognitive status: WFL    SENSATION: 08/25/2023  WFL  EDEMA:  08/25/2023 Localized edema Rt knee  MUSCLE LENGTH: 08/25/2023 No specific testing.   POSTURE:  08/25/2023 Unremarkable   PALPATION: 08/25/2023 Mild tenderness around incision.   LOWER EXTREMITY ROM:   ROM Right Eval 08/25/2023 Left Eval 08/25/2023  Hip flexion    Hip extension    Hip abduction    Hip adduction    Hip internal rotation    Hip external rotation    Knee flexion 60 AROM in supine heel slide with pain   Knee extension -16 AROM in seated LAQ  -9 in supine heel prop   Ankle dorsiflexion    Ankle plantarflexion    Ankle inversion    Ankle eversion     (Blank rows = not tested)  LOWER EXTREMITY MMT:  MMT Right Eval 08/25/2023  Left Eval 08/25/2023  Hip flexion 3+/5 5/5  Hip extension    Hip abduction    Hip adduction    Hip internal rotation    Hip external rotation    Knee flexion 4/5 5/5  Knee extension NT 5/5  Ankle dorsiflexion 5/5   Ankle plantarflexion    Ankle inversion    Ankle eversion     (Blank rows = not tested)  LOWER EXTREMITY SPECIAL TESTS:  08/25/2023 No specific testing.   FUNCTIONAL TESTS:  08/25/2023 18 inch chair transfer: Unable unassist  Lt SLS: not tested Rt SLS: unable   GAIT: 08/25/2023 Hinge brace 0-60 degrees with bilateral axillary crutches.  Step to pattern primarily with partial WB noted on Rt leg. Associated deviations due to brace locking at 60 deg (hip circumduction, hike etc)                                                                                                                                                                        TODAY'S TREATMENT                                                                          DATE: 08/29/2023 Therex: Nustep for ROM with 0-60 degrees with brace on lvl 8 mins Incline gastroc stretch 30 sec x 3 bilateral in standing Supine Rt leg quad set with SLR x 10   Neuro Re-ed Seated Rt leg quad set 5 sec hold x 10  Seated 45 deg Rt knee flexion alternating isometrics painfree, submax for extension and flexion 5 sec each x 12 each way Supine quad set Rt leg 5 sec hold x  10   Gait Training Axillary crutch use in Lt UE single with cues for sequencing and techniques.  Trial in clinic household distances < 100 ft.  Vaso 10 mins Rt knee in elevation 34 deg medium compression for swelling.    TODAY'S TREATMENT                                                                          DATE: 08/26/23 Medial-lateral patella mobs per protocol  Knee extension overpressure stretches with bolster under heel   Checked quad set form for HEP/cues to reduce glute activation, then 10x3 second holds SLRs 2x10 0#  SAQs 2x10 0#  Sidelying  hip ABD 2x10 0#  Adjusted brace/educated on proper fit, educated on expected course of recovery after patellar tendon recovery and time needed for full recovery especially with soft tissue surgeries/injuries    TODAY'S TREATMENT                                                                          DATE:08/25/2023 Therex:    HEP instruction/performance c cues for techniques, handout provided.  Trial set performed of each for comprehension and symptom assessment.  See below for exercise list  Self Care Education and cues on edema control with elevation, icing.  Advised not falling asleep with heating pad on to avoid burns/fire risk.  Encouraged positioning without slight knee flexion.   Vaso 10 mins Rt knee in elevation 34 deg medium compression for swelling.   PATIENT EDUCATION:  08/25/2023 Education details: HEP, POC Person educated: Patient Education method: Programmer, multimedia, Demonstration, Verbal cues, and Handouts Education comprehension: verbalized understanding, returned demonstration, and verbal cues required  HOME EXERCISE PROGRAM: Access Code: [email or web address] URL: [email or web address] Date: 08/25/2023 Prepared by: Bonna Bustard  Exercises - Supine Heel Slide  - 3-5 x daily - 7 x weekly - 1 sets - 10 reps - 5 hold - Seated Long Arc Quad  - 3-5 x daily - 7 x weekly - 1 sets - 5-10 reps - 2 hold - Supine Knee Extension Mobilization with Weight  - 4-5 x daily - 7 x weekly - 1 sets - 1 reps - to tolerance up to 5 mins hold - Supine Quadricep Sets  - 3-5 x daily - 7 x weekly - 1 sets - 10 reps - 5 hold - Seated Quad Set  - 3-5 x daily - 7 x weekly - 1 sets - 10 reps - 5 hold  ASSESSMENT:  CLINICAL IMPRESSION: Improved quality of quad activation in quad set today.  Fair amount of force noted in isometrics activation today.  Continued skilled PT services indicated to progress within available range.    OBJECTIVE IMPAIRMENTS: Abnormal gait, decreased activity tolerance,  decreased balance, decreased coordination, decreased endurance, decreased mobility, difficulty walking, decreased ROM, decreased strength, hypomobility, increased edema, increased fascial restrictions, impaired perceived functional ability, increased muscle spasms, impaired flexibility, improper body mechanics, and pain.   ACTIVITY LIMITATIONS: carrying, lifting,  bending, sitting, standing, squatting, sleeping, stairs, transfers, bed mobility, and locomotion level  PARTICIPATION LIMITATIONS: meal prep, cleaning, laundry, interpersonal relationship, driving, shopping, community activity, occupation, and yard work  PERSONAL FACTORS: Anxiety, HTN are also affecting patient's functional outcome.   REHAB POTENTIAL: Good  CLINICAL DECISION MAKING: Stable/uncomplicated  EVALUATION COMPLEXITY: Low   GOALS: Goals reviewed with patient? Yes  SHORT TERM GOALS: (target date for Short term goals are 3 weeks 09/15/2023)   1.  Patient will demonstrate independent use of home exercise program to maintain progress from in clinic treatments.  Goal status: on going 08/29/2023  LONG TERM GOALS: (target dates for all long term goals are 10 weeks  11/03/2023 )   1. Patient will demonstrate/report pain at worst less than or equal to 2/10 to facilitate minimal limitation in daily activity secondary to pain symptoms.  Goal status: New   2. Patient will demonstrate independent use of home exercise program to facilitate ability to maintain/progress functional gains from skilled physical therapy services.  Goal status: New   3. Patient will demonstrate Patient specific functional scale avg > or = 8/10 to indicate reduced disability due to condition.   Goal status: New   4.  Patient will demonstrate Rt LE MMT 5/5, dynamometry within 15 % of Lt for knee extension to faciltiate usual transfers, stairs, squatting at PLOF for daily life.   (No testing on eval for dynamometry due to surgical protocol)  Goal  status: New   5.  Patient will demonstrate independent ambulation > 500 ft for community integration at [facility].  Goal status: New   6.  Patient will demonstrate ascending/descending stairs reciprocally s UE assist for community integration.   Goal status: New   7.  Patient will demonstrate SLS bilateral > 30 seconds for stability in ambualtion.  Goal Status: New   PLAN:  PT FREQUENCY: 2x/week  PT DURATION: 10 weeks  PLANNED INTERVENTIONS: Can include 13086- PT Re-evaluation, 97110-Therapeutic exercises, 97530- Therapeutic activity, 97112- Neuromuscular re-education, 97535- Self Care, 97140- Manual therapy, [phone]- Gait training, [id number]- Orthotic Fit/training, [phone]- Canalith repositioning, [id number]- Aquatic Therapy, [phone]- Electrical stimulation (unattended), [phone]- Electrical stimulation (manual), [id number] Physical performance testing, 97016- Vasopneumatic device, [id number]- Ultrasound, [id number]- Traction (mechanical), [id number]- Ionotophoresis 4mg /ml Dexamethasone , Patient/Family education, Balance training, Stair training, Taping, Dry Needling, Joint mobilization, Joint manipulation, Spinal manipulation, Spinal mobilization, Scar mobilization, Vestibular training, Visual/preceptual remediation/compensation, DME instructions, Cryotherapy, and Moist heat.  All performed as medically necessary.  All included unless contraindicated  PLAN FOR NEXT SESSION:    Early quad activation, isometrics in various knee 0-60 deg flexion positioning.  Improved qualty of range in available range in brace.  Early static balance.    Review single crutch use.    NO loaded CKC with weight at this time, no weight loaded OKC extension per protocol recommendations.   Hinge brace 0-60 degrees from PA visit on 08/23/2023 until return visit in approx. 4 weeks.  No in house protocol directly provided.  Respected protocol from [facility] in folder bin in clinic.    Bonna Bustard, PT, DPT, OCS, ATC 08/29/23  9:29 AM

## 2023-09-06 ENCOUNTER — Encounter: Payer: Self-pay | Admitting: Physical Therapy

## 2023-09-06 ENCOUNTER — Ambulatory Visit: Admitting: Physical Therapy

## 2023-09-06 DIAGNOSIS — R262 Difficulty in walking, not elsewhere classified: Secondary | ICD-10-CM | POA: Diagnosis not present

## 2023-09-06 DIAGNOSIS — M25561 Pain in right knee: Secondary | ICD-10-CM | POA: Diagnosis not present

## 2023-09-06 DIAGNOSIS — M6281 Muscle weakness (generalized): Secondary | ICD-10-CM | POA: Diagnosis not present

## 2023-09-06 DIAGNOSIS — M25661 Stiffness of right knee, not elsewhere classified: Secondary | ICD-10-CM

## 2023-09-06 DIAGNOSIS — R600 Localized edema: Secondary | ICD-10-CM

## 2023-09-06 NOTE — Therapy (Signed)
 OUTPATIENT PHYSICAL THERAPY TREATMENT    Patient Name: Dale Fitzgerald MRN: [id number] DOB:[birth year], 38 y.o., male Today's Date: 09/06/2023  END OF SESSION:  PT End of Session - 09/06/23 1304     Visit Number 4    Number of Visits 20    Date for PT Re-Evaluation 11/03/23    Authorization Type [employer] $40 copay    Progress Note Due on Visit 10    PT Start Time 1302    PT Stop Time 1351    PT Time Calculation (min) 49 min    Activity Tolerance Patient limited by pain    Behavior During Therapy [facility] for tasks assessed/performed             Past Medical History:  Diagnosis Date   Anxiety    Hypertension    Past Surgical History:  Procedure Laterality Date   APPENDECTOMY     Patient Active Problem List   Diagnosis Date Noted   Patellar tendon rupture, right, initial encounter 07/12/2023   Depression     PCP: No provider listed in epic.   REFERRING PROVIDER: Leigh Punch  REFERRING DIAG: [phone] (ICD-10-CM) - Patellar tendon rupture, right, initial encounter  THERAPY DIAG:  Acute pain of right knee  Muscle weakness (generalized)  Stiffness of right knee, not elsewhere classified  Difficulty in walking, not elsewhere classified  Localized edema  Rationale for Evaluation and Treatment: Rehabilitation  ONSET DATE: Surgery 07/21/2023  SUBJECTIVE:   SUBJECTIVE STATEMENT: The brace is locked or does not seem to bend correctly.  He is able to walk in house and level sidewalk with single crutch.  He felt it buckle 1 time.   PERTINENT HISTORY: Right patella tendon rupture 07/11/2023 with surgery 07/21/2023. Had been wearing hinge brace 0-30 degrees prior to 08/23/2023 PA visit.  Plan for follow up in 4 weeks for brace unlocking to 90 deg.  Currently set from 0-60.    PAIN:  NPRS scale: at rest 0/10, walking / standing 2/10 and exercising 2-3/10 Pain location: Rt knee  Pain description: shock, tight Aggravating factors: end ranges, WB pressure Relieving  factors: has used heat but not   PRECAUTIONS: Knee - No in house protocol directly provided.  Respected protocol from [facility] in folder bin in clinic.   WEIGHT BEARING RESTRICTIONS: Brace 0-60 from 08/23/2023 until return to PA in approx. 4 weeks.   FALLS:  Has patient fallen in last 6 months? Yes. Number of falls 1 - current injury  LIVING ENVIRONMENT: Lives in: House/apartment Stairs: Flight of stairs in house.    Outside small step for porch without rail Has following equipment at home: Bilateral crutches, locking stairs.   OCCUPATION: Has worked 3 jobs. 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   PLOF: Independent, basketball recreationally, playing sports with son.    PATIENT GOALS: Reduce pain, get back to work/life. Get stronger  OBJECTIVE:   PATIENT SURVEYS:  Patient-Specific Activity Scoring Scheme  0 represents "unable to perform." 10 represents "able to perform at prior level. 0 1 2 3 4 5 6 7 8 9  10 (Date and Score)   Activity Eval  08/25/2023    1. Walking independently  0    2. Stairs  4    3. work 0   4.Recreational sport 0   5.    Score 1 avg    Total score = sum of the activity scores/number of  activities Minimum detectable change (90%CI) for average score = 2 points Minimum detectable change (90%CI) for single activity score = 3 points  COGNITION: 08/25/2023 Overall cognitive status: WFL    SENSATION: 08/25/2023 WFL  EDEMA:  08/25/2023 Localized edema Rt knee  MUSCLE LENGTH: 08/25/2023 No specific testing.   POSTURE:  08/25/2023 Unremarkable   PALPATION: 08/25/2023 Mild tenderness around incision.   LOWER EXTREMITY ROM:   ROM Right Eval 08/25/2023 Left Eval 08/25/2023  Hip flexion    Hip extension    Hip abduction    Hip adduction    Hip internal rotation    Hip external rotation    Knee flexion 60 AROM in supine heel slide with pain   Knee extension -16 AROM in seated LAQ  -9 in supine  heel prop   Ankle dorsiflexion    Ankle plantarflexion    Ankle inversion    Ankle eversion     (Blank rows = not tested)  LOWER EXTREMITY MMT:  MMT Right Eval 08/25/2023 Left Eval 08/25/2023  Hip flexion 3+/5 5/5  Hip extension    Hip abduction    Hip adduction    Hip internal rotation    Hip external rotation    Knee flexion 4/5 5/5  Knee extension NT 5/5  Ankle dorsiflexion 5/5   Ankle plantarflexion    Ankle inversion    Ankle eversion     (Blank rows = not tested)  LOWER EXTREMITY SPECIAL TESTS:  08/25/2023 No specific testing.   FUNCTIONAL TESTS:  08/25/2023 18 inch chair transfer: Unable unassist  Lt SLS: not tested Rt SLS: unable   GAIT: 08/25/2023 Hinge brace 0-60 degrees with bilateral axillary crutches.  Step to pattern primarily with partial WB noted on Rt leg. Associated deviations due to brace locking at 60 deg (hip circumduction, hike etc)                                                                                                                                                                        TODAY'S TREATMENT                                                                          DATE: 09/06/2023 Therex: Nustep LEs only for ROM & focus on RLE using quads for ext / press out motion  (0-60 degrees with brace) on lvl 8 for 8 mins Supine Rt leg quad set with SLR x 10   Neuro Re-ed Standing  with single crutch partial weight on Rt leg terminal knee ext with ankle DF  red theraband 5 sec hold x 10 reps Seated LAQ with KO limiting flexion PT end range assist / isometric hold and strap assisted eccentric 10 reps Supine SLR with quad set initially and reset at top of lift and controlled lowering 5 reps.   Orthotic Training: PT instructed pt in KO aligning mechanical joint with his joint, lock/unlock on both medial & lateral joint, uprights velcro so along lateral / medial femur & tibia.  Pt verbalized understanding.  Axillary crutch use in Lt UE single  with cues for sequencing and techniques.  Trial in clinic household distances < 100 ft.  Vaso 10 mins Rt knee in elevation 34 deg medium compression for swelling.   TREATMENT                                                                          DATE: 08/29/2023 Therex: Nustep for ROM with 0-60 degrees with brace on lvl 8 mins Incline gastroc stretch 30 sec x 3 bilateral in standing Supine Rt leg quad set with SLR x 10   Neuro Re-ed Seated Rt leg quad set 5 sec hold x 10  Seated 45 deg Rt knee flexion alternating isometrics painfree, submax for extension and flexion 5 sec each x 12 each way Supine quad set Rt leg 5 sec hold x 10   Gait Training Axillary crutch use in Lt UE single with cues for sequencing and techniques.  Trial in clinic household distances < 100 ft.  Vaso 10 mins Rt knee in elevation 34 deg medium compression for swelling.    TREATMENT                                                                          DATE: 08/26/23 Medial-lateral patella mobs per protocol  Knee extension overpressure stretches with bolster under heel   Checked quad set form for HEP/cues to reduce glute activation, then 10x3 second holds SLRs 2x10 0#  SAQs 2x10 0#  Sidelying hip ABD 2x10 0#  Adjusted brace/educated on proper fit, educated on expected course of recovery after patellar tendon recovery and time needed for full recovery especially with soft tissue surgeries/injuries    TREATMENT                                                                          DATE:08/25/2023 Therex:    HEP instruction/performance c cues for techniques, handout provided.  Trial set performed of each for comprehension and symptom assessment.  See below for exercise list  Self Care Education and cues on edema control with elevation, icing.  Advised not  falling asleep with heating pad on to avoid burns/fire risk.  Encouraged positioning without slight knee flexion.   Vaso 10 mins Rt knee in elevation 34  deg medium compression for swelling.   PATIENT EDUCATION:  08/25/2023 Education details: HEP, POC Person educated: Patient Education method: Programmer, multimedia, Demonstration, Verbal cues, and Handouts Education comprehension: verbalized understanding, returned demonstration, and verbal cues required  HOME EXERCISE PROGRAM: Access Code: [email or web address] URL: [email or web address] Date: 08/25/2023 Prepared by: Bonna Bustard  Exercises - Supine Heel Slide  - 3-5 x daily - 7 x weekly - 1 sets - 10 reps - 5 hold - Seated Long Arc Quad  - 3-5 x daily - 7 x weekly - 1 sets - 5-10 reps - 2 hold - Supine Knee Extension Mobilization with Weight  - 4-5 x daily - 7 x weekly - 1 sets - 1 reps - to tolerance up to 5 mins hold - Supine Quadricep Sets  - 3-5 x daily - 7 x weekly - 1 sets - 10 reps - 5 hold - Seated Quad Set  - 3-5 x daily - 7 x weekly - 1 sets - 10 reps - 5 hold   ASSESSMENT:  CLINICAL IMPRESSION: Patient appears to have a better understanding for knee orthosis positioning and lock mechanism.  Patient improved his ability to fire quadriceps but they fatigue really rapidly.  OBJECTIVE IMPAIRMENTS: Abnormal gait, decreased activity tolerance, decreased balance, decreased coordination, decreased endurance, decreased mobility, difficulty walking, decreased ROM, decreased strength, hypomobility, increased edema, increased fascial restrictions, impaired perceived functional ability, increased muscle spasms, impaired flexibility, improper body mechanics, and pain.   ACTIVITY LIMITATIONS: carrying, lifting, bending, sitting, standing, squatting, sleeping, stairs, transfers, bed mobility, and locomotion level  PARTICIPATION LIMITATIONS: meal prep, cleaning, laundry, interpersonal relationship, driving, shopping, community activity, occupation, and yard work  PERSONAL FACTORS: Anxiety, HTN are also affecting patient's functional outcome.   REHAB POTENTIAL: Good  CLINICAL DECISION MAKING:  Stable/uncomplicated  EVALUATION COMPLEXITY: Low   GOALS: Goals reviewed with patient? Yes  SHORT TERM GOALS: (target date for Short term goals are 3 weeks 09/15/2023)   1.  Patient will demonstrate independent use of home exercise program to maintain progress from in clinic treatments.  Goal status: Ongoing   09/06/2023  LONG TERM GOALS: (target dates for all long term goals are 10 weeks  11/03/2023 )   1. Patient will demonstrate/report pain at worst less than or equal to 2/10 to facilitate minimal limitation in daily activity secondary to pain symptoms.  Goal status: Ongoing   09/06/2023   2. Patient will demonstrate independent use of home exercise program to facilitate ability to maintain/progress functional gains from skilled physical therapy services.  Goal status: Ongoing   09/06/2023   3. Patient will demonstrate Patient specific functional scale avg > or = 8/10 to indicate reduced disability due to condition.   Goal status: Ongoing   09/06/2023   4.  Patient will demonstrate Rt LE MMT 5/5, dynamometry within 15 % of Lt for knee extension to faciltiate usual transfers, stairs, squatting at PLOF for daily life.   (No testing on eval for dynamometry due to surgical protocol)  Goal status: Ongoing   09/06/2023   5.  Patient will demonstrate independent ambulation > 500 ft for community integration at [facility].  Goal status:   Ongoing   09/06/2023   6.  Patient will demonstrate ascending/descending stairs reciprocally s UE assist for community integration.   Goal status: Ongoing   09/06/2023   7.  Patient will demonstrate SLS bilateral > 30 seconds for stability in ambualtion.  Goal Status: Ongoing   09/06/2023   PLAN:  PT FREQUENCY: 2x/week  PT DURATION: 10 weeks  PLANNED INTERVENTIONS: Can include 16109- PT Re-evaluation, 97110-Therapeutic exercises, 97530- Therapeutic activity, 97112- Neuromuscular re-education, 97535- Self Care, 97140- Manual therapy, [phone]- Gait training,  [phone]- Orthotic Fit/training, [phone]- Canalith repositioning, [id number]- Aquatic Therapy, [phone]- Electrical stimulation (unattended), [phone]- Electrical stimulation (manual), [id number] Physical performance testing, 97016- Vasopneumatic device, [id number]- Ultrasound, [id number]- Traction (mechanical), [id number]- Ionotophoresis 4mg /ml Dexamethasone , Patient/Family education, Balance training, Stair training, Taping, Dry Needling, Joint mobilization, Joint manipulation, Spinal manipulation, Spinal mobilization, Scar mobilization, Vestibular training, Visual/preceptual remediation/compensation, DME instructions, Cryotherapy, and Moist heat.  All performed as medically necessary.  All included unless contraindicated  PLAN FOR NEXT SESSION:   Continue with Early quad activation, isometrics in various knee 0-60 deg flexion positioning.  Improved qualty of range in available range in brace.  Early static balance.    Continue single crutch use.    NO loaded CKC with weight at this time, no weight loaded OKC extension per protocol recommendations.   Hinge brace 0-60 degrees from PA visit on 08/23/2023 until return visit in approx. 4 weeks.  No in house protocol directly provided.  Respected protocol from [facility] in folder bin in clinic.     Lorie Rook, PT, DPT 09/06/2023, 3:48 PM

## 2023-09-08 ENCOUNTER — Encounter: Admitting: Physical Therapy

## 2023-09-12 ENCOUNTER — Ambulatory Visit: Admitting: Physical Therapy

## 2023-09-12 ENCOUNTER — Encounter: Payer: Self-pay | Admitting: Physical Therapy

## 2023-09-12 DIAGNOSIS — M25661 Stiffness of right knee, not elsewhere classified: Secondary | ICD-10-CM | POA: Diagnosis not present

## 2023-09-12 DIAGNOSIS — R262 Difficulty in walking, not elsewhere classified: Secondary | ICD-10-CM

## 2023-09-12 DIAGNOSIS — M25561 Pain in right knee: Secondary | ICD-10-CM

## 2023-09-12 DIAGNOSIS — R600 Localized edema: Secondary | ICD-10-CM

## 2023-09-12 DIAGNOSIS — M6281 Muscle weakness (generalized): Secondary | ICD-10-CM | POA: Diagnosis not present

## 2023-09-12 NOTE — Therapy (Signed)
 OUTPATIENT PHYSICAL THERAPY TREATMENT    Patient Name: Dale Fitzgerald MRN: [id number] DOB:1985-08-01, 38 y.o., male Today's Date: 09/12/2023  END OF SESSION:  PT End of Session - 09/12/23 1520     Visit Number 5    Number of Visits 20    Date for PT Re-Evaluation 11/03/23    Authorization Type [employer] $40 copay    Progress Note Due on Visit 10    PT Start Time 1517    PT Stop Time 1600    PT Time Calculation (min) 43 min    Activity Tolerance Patient limited by pain    Behavior During Therapy [facility] for tasks assessed/performed              Past Medical History:  Diagnosis Date   Anxiety    Hypertension    Past Surgical History:  Procedure Laterality Date   APPENDECTOMY     Patient Active Problem List   Diagnosis Date Noted   Patellar tendon rupture, right, initial encounter 07/12/2023   Depression     PCP: No provider listed in epic.   REFERRING PROVIDER: Jule Ronal LITTIE DEVONNA  REFERRING DIAG: [phone] (ICD-10-CM) - Patellar tendon rupture, right, initial encounter  THERAPY DIAG:  Acute pain of right knee  Muscle weakness (generalized)  Stiffness of right knee, not elsewhere classified  Difficulty in walking, not elsewhere classified  Localized edema  Rationale for Evaluation and Treatment: Rehabilitation  ONSET DATE: Surgery 07/21/2023  SUBJECTIVE:   SUBJECTIVE STATEMENT: Still feels like he still has some buckling episodes.   PERTINENT HISTORY: Right patella tendon rupture 07/11/2023 with surgery 07/21/2023. Had been wearing hinge brace 0-30 degrees prior to 08/23/2023 PA visit.  Plan for follow up in 4 weeks for brace unlocking to 90 deg.  Currently set from 0-60.    PAIN:  NPRS scale: at rest 0/10, walking / standing 2/10 and exercising 2-3/10 Pain location: Rt knee  Pain description: shock, tight Aggravating factors: end ranges, WB pressure Relieving factors: has used heat but not   PRECAUTIONS: Knee - No in house protocol directly provided.   Respected protocol from [facility] in folder bin in clinic.   WEIGHT BEARING RESTRICTIONS: Brace 0-60 from 08/23/2023 until return to PA in approx. 4 weeks.   FALLS:  Has patient fallen in last 6 months? Yes. Number of falls 1 - current injury  LIVING ENVIRONMENT: Lives in: House/apartment Stairs: Flight of stairs in house.    Outside small step for porch without rail Has following equipment at home: Bilateral crutches, locking stairs.   OCCUPATION: Has worked 3 jobs. 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   PLOF: Independent, basketball recreationally, playing sports with son.    PATIENT GOALS: Reduce pain, get back to work/life. Get stronger  OBJECTIVE:   PATIENT SURVEYS:  Patient-Specific Activity Scoring Scheme  0 represents "unable to perform." 10 represents "able to perform at prior level. 0 1 2 3 4 5 6 7 8 9  10 (Date and Score)   Activity Eval  08/25/2023    1. Walking independently  0    2. Stairs  4    3. work 0   4.Recreational sport 0   5.    Score 1 avg    Total score = sum of the activity scores/number of activities Minimum detectable change (90%CI) for average score = 2 points Minimum detectable change (90%CI) for single activity score = 3 points  COGNITION: 08/25/2023 Overall cognitive status: WFL    SENSATION: 08/25/2023 WFL  EDEMA:  08/25/2023 Localized edema Rt knee  MUSCLE LENGTH: 08/25/2023 No specific testing.   POSTURE:  08/25/2023 Unremarkable   PALPATION: 08/25/2023 Mild tenderness around incision.   LOWER EXTREMITY ROM:   ROM Right Eval 08/25/2023 Left Eval 08/25/2023  Knee flexion 60 AROM in supine heel slide with pain   Knee extension -16 AROM in seated LAQ  -9 in supine heel prop    (Blank rows = not tested)  LOWER EXTREMITY MMT:  MMT Right Eval 08/25/2023 Left Eval 08/25/2023  Hip flexion 3+/5 5/5  Hip extension    Hip abduction    Hip adduction    Hip internal rotation     Hip external rotation    Knee flexion 4/5 5/5  Knee extension NT 5/5  Ankle dorsiflexion 5/5   Ankle plantarflexion    Ankle inversion    Ankle eversion     (Blank rows = not tested)  LOWER EXTREMITY SPECIAL TESTS:  08/25/2023 No specific testing.   FUNCTIONAL TESTS:  08/25/2023 18 inch chair transfer: Unable unassist  Lt SLS: not tested Rt SLS: unable   GAIT: 08/25/2023 Hinge brace 0-60 degrees with bilateral axillary crutches.  Step to pattern primarily with partial WB noted on Rt leg. Associated deviations due to brace locking at 60 deg (hip circumduction, hike etc)                                                                                                                                                                        TODAY'S TREATMENT 09/12/23 TherEx NuStep L6 x 8 min; seat 15 Seated quad set 2x10; 5 sec hold Seated SLR 2x10; cues to decrease extensor lag Long sitting/supine SLR 2x10 with 3 sec hold Supine SAQ 4# 2x10; 3 sec hold  Modalities Vaso x 10 mins Rt knee in elevation 34 deg medium compression for swelling.    09/06/2023 Therex: Nustep LEs only for ROM & focus on RLE using quads for ext / press out motion  (0-60 degrees with brace) on lvl 8 for 8 mins Supine Rt leg quad set with SLR x 10   Neuro Re-ed Standing with single crutch partial weight on Rt leg terminal knee ext with ankle DF  red theraband 5 sec hold x 10 reps Seated LAQ with KO limiting flexion PT end range assist / isometric hold and strap assisted eccentric 10 reps Supine SLR with quad set initially and reset at top of lift and controlled lowering 5 reps.   Orthotic Training: PT instructed pt in KO aligning mechanical joint with his joint, lock/unlock on both medial & lateral joint, uprights velcro so along lateral / medial femur & tibia.  Pt verbalized understanding.  Axillary crutch use in Lt UE single with cues for sequencing and techniques.  Trial in clinic household distances < 100  ft.  Vaso 10 mins Rt knee in elevation 34 deg medium compression for swelling.   08/29/2023 Therex: Nustep for ROM with 0-60 degrees with brace on lvl 8 mins Incline gastroc stretch 30 sec x 3 bilateral in standing Supine Rt leg quad set with SLR x 10   Neuro Re-ed Seated Rt leg quad set 5 sec hold x 10  Seated 45 deg Rt knee flexion alternating isometrics painfree, submax for extension and flexion 5 sec each x 12 each way Supine quad set Rt leg 5 sec hold x 10   Gait Training Axillary crutch use in Lt UE single with cues for sequencing and techniques.  Trial in clinic household distances < 100 ft.  Vaso 10 mins Rt knee in elevation 34 deg medium compression for swelling.     08/26/23 Medial-lateral patella mobs per protocol  Knee extension overpressure stretches with bolster under heel   Checked quad set form for HEP/cues to reduce glute activation, then 10x3 second holds SLRs 2x10 0#  SAQs 2x10 0#  Sidelying hip ABD 2x10 0#  Adjusted brace/educated on proper fit, educated on expected course of recovery after patellar tendon recovery and time needed for full recovery especially with soft tissue surgeries/injuries    08/25/2023 Therex:    HEP instruction/performance c cues for techniques, handout provided.  Trial set performed of each for comprehension and symptom assessment.  See below for exercise list  Self Care Education and cues on edema control with elevation, icing.  Advised not falling asleep with heating pad on to avoid burns/fire risk.  Encouraged positioning without slight knee flexion.   Vaso 10 mins Rt knee in elevation 34 deg medium compression for swelling.   PATIENT EDUCATION:  08/25/2023 Education details: HEP, POC Person educated: Patient Education method: Programmer, multimedia, Demonstration, Verbal cues, and Handouts Education comprehension: verbalized understanding, returned demonstration, and verbal cues required  HOME EXERCISE PROGRAM: Access Code:  [email or web address] URL: [email or web address] Date: 08/25/2023 Prepared by: Ozell Silvan  Exercises - Supine Heel Slide  - 3-5 x daily - 7 x weekly - 1 sets - 10 reps - 5 hold - Seated Long Arc Quad  - 3-5 x daily - 7 x weekly - 1 sets - 5-10 reps - 2 hold - Supine Knee Extension Mobilization with Weight  - 4-5 x daily - 7 x weekly - 1 sets - 1 reps - to tolerance up to 5 mins hold - Supine Quadricep Sets  - 3-5 x daily - 7 x weekly - 1 sets - 10 reps - 5 hold - Seated Quad Set  - 3-5 x daily - 7 x weekly - 1 sets - 10 reps - 5 hold   ASSESSMENT:  CLINICAL IMPRESSION: Pt tolerated session well today with continued focus on quad activation and strengthening.  Will continue to benefit from PT to maximize function.   OBJECTIVE IMPAIRMENTS: Abnormal gait, decreased activity tolerance, decreased balance, decreased coordination, decreased endurance, decreased mobility, difficulty walking, decreased ROM, decreased strength, hypomobility, increased edema, increased fascial restrictions, impaired perceived functional ability, increased muscle spasms, impaired flexibility, improper body mechanics, and pain.   ACTIVITY LIMITATIONS: carrying, lifting, bending, sitting, standing, squatting, sleeping, stairs, transfers, bed mobility, and locomotion level  PARTICIPATION LIMITATIONS: meal prep, cleaning, laundry, interpersonal relationship, driving, shopping, community activity, occupation, and yard work  PERSONAL FACTORS: Anxiety, HTN are also affecting  patient's functional outcome.   REHAB POTENTIAL: Good  CLINICAL DECISION MAKING: Stable/uncomplicated  EVALUATION COMPLEXITY: Low   GOALS: Goals reviewed with patient? Yes  SHORT TERM GOALS: (target date for Short term goals are 3 weeks 09/15/2023)   1.  Patient will demonstrate independent use of home exercise program to maintain progress from in clinic treatments.  Goal status: Ongoing   09/06/2023  LONG TERM GOALS: (target dates for  all long term goals are 10 weeks  11/03/2023 )   1. Patient will demonstrate/report pain at worst less than or equal to 2/10 to facilitate minimal limitation in daily activity secondary to pain symptoms.  Goal status: Ongoing   09/06/2023   2. Patient will demonstrate independent use of home exercise program to facilitate ability to maintain/progress functional gains from skilled physical therapy services.  Goal status: Ongoing   09/06/2023   3. Patient will demonstrate Patient specific functional scale avg > or = 8/10 to indicate reduced disability due to condition.   Goal status: Ongoing   09/06/2023   4.  Patient will demonstrate Rt LE MMT 5/5, dynamometry within 15 % of Lt for knee extension to faciltiate usual transfers, stairs, squatting at PLOF for daily life.   (No testing on eval for dynamometry due to surgical protocol)  Goal status: Ongoing   09/06/2023   5.  Patient will demonstrate independent ambulation > 500 ft for community integration at [facility].  Goal status:   Ongoing   09/06/2023   6.  Patient will demonstrate ascending/descending stairs reciprocally s UE assist for community integration.   Goal status: Ongoing   09/06/2023   7.  Patient will demonstrate SLS bilateral > 30 seconds for stability in ambualtion.  Goal Status: Ongoing   09/06/2023   PLAN:  PT FREQUENCY: 2x/week  PT DURATION: 10 weeks  PLANNED INTERVENTIONS: Can include 02853- PT Re-evaluation, 97110-Therapeutic exercises, 97530- Therapeutic activity, 97112- Neuromuscular re-education, 97535- Self Care, 97140- Manual therapy, [phone]- Gait training, [phone]- Orthotic Fit/training, [phone]- Canalith repositioning, [id number]- Aquatic Therapy, [phone]- Electrical stimulation (unattended), [phone]- Electrical stimulation (manual), [id number] Physical performance testing, 97016- Vasopneumatic device, [id number]- Ultrasound, [id number]- Traction (mechanical), [id number]- Ionotophoresis 4mg /ml Dexamethasone , Patient/Family education, Balance training,  Stair training, Taping, Dry Needling, Joint mobilization, Joint manipulation, Spinal manipulation, Spinal mobilization, Scar mobilization, Vestibular training, Visual/preceptual remediation/compensation, DME instructions, Cryotherapy, and Moist heat.  All performed as medically necessary.  All included unless contraindicated  PLAN FOR NEXT SESSION:    quad activation, isometrics in various knee 0-60 deg flexion positioning.  Improved qualty of range in available range in brace.  Early static balance.    Continue single crutch use.    NO loaded CKC with weight at this time, no weight loaded OKC extension per protocol recommendations.   Hinge brace 0-60 degrees from PA visit on 08/23/2023 until return visit in approx. 4 weeks.  No in house protocol directly provided.  Respected protocol from [facility] in folder bin in clinic.     Corean JULIANNA Ku, PT, DPT 09/12/23 3:49 PM

## 2023-09-15 ENCOUNTER — Encounter: Payer: Self-pay | Admitting: Rehabilitative and Restorative Service Providers"

## 2023-09-15 ENCOUNTER — Ambulatory Visit: Admitting: Rehabilitative and Restorative Service Providers"

## 2023-09-15 DIAGNOSIS — M25661 Stiffness of right knee, not elsewhere classified: Secondary | ICD-10-CM | POA: Diagnosis not present

## 2023-09-15 DIAGNOSIS — R262 Difficulty in walking, not elsewhere classified: Secondary | ICD-10-CM | POA: Diagnosis not present

## 2023-09-15 DIAGNOSIS — M6281 Muscle weakness (generalized): Secondary | ICD-10-CM | POA: Diagnosis not present

## 2023-09-15 DIAGNOSIS — M25561 Pain in right knee: Secondary | ICD-10-CM

## 2023-09-15 DIAGNOSIS — R600 Localized edema: Secondary | ICD-10-CM

## 2023-09-15 NOTE — Therapy (Signed)
 OUTPATIENT PHYSICAL THERAPY TREATMENT    Patient Name: Dale Fitzgerald MRN: [id number] DOB:[birth year], 38 y.o., male Today's Date: 09/15/2023  END OF SESSION:  PT End of Session - 09/15/23 1425     Visit Number 6    Number of Visits 20    Date for PT Re-Evaluation 11/03/23    Authorization Type [employer] $40 copay    Progress Note Due on Visit 10    PT Start Time 1423    PT Stop Time 1526    PT Time Calculation (min) 63 min    Activity Tolerance Patient tolerated treatment well    Behavior During Therapy WFL for tasks assessed/performed               Past Medical History:  Diagnosis Date   Anxiety    Hypertension    Past Surgical History:  Procedure Laterality Date   APPENDECTOMY     Patient Active Problem List   Diagnosis Date Noted   Patellar tendon rupture, right, initial encounter 07/12/2023   Depression     PCP: No provider listed in epic.   REFERRING PROVIDER: Jule Ronal LITTIE DEVONNA  REFERRING DIAG: [phone] (ICD-10-CM) - Patellar tendon rupture, right, initial encounter  THERAPY DIAG:  Acute pain of right knee  Muscle weakness (generalized)  Stiffness of right knee, not elsewhere classified  Difficulty in walking, not elsewhere classified  Localized edema  Rationale for Evaluation and Treatment: Rehabilitation  ONSET DATE: Surgery 07/21/2023  SUBJECTIVE:   SUBJECTIVE STATEMENT: Pt indicated having 2/10 sore on front of Rt knee upon arrival.    PERTINENT HISTORY: Right patella tendon rupture 07/11/2023 with surgery 07/21/2023. Had been wearing hinge brace 0-30 degrees prior to 08/23/2023 PA visit.  Plan for follow up in 4 weeks for brace unlocking to 90 deg.  Currently set from 0-60.    PAIN:  NPRS scale: at rest 0/10, walking / standing 2/10 and exercising 2-3/10 Pain location: Rt knee  Pain description: shock, tight Aggravating factors: end ranges, WB pressure Relieving factors: has used heat but not   PRECAUTIONS: Knee - No in house protocol  directly provided.  Respected protocol from [facility] in folder bin in clinic.   WEIGHT BEARING RESTRICTIONS: Brace 0-60 from 08/23/2023 until return to PA in approx. 4 weeks.   FALLS:  Has patient fallen in last 6 months? Yes. Number of falls 1 - current injury  LIVING ENVIRONMENT: Lives in: House/apartment Stairs: Flight of stairs in house.    Outside small step for porch without rail Has following equipment at home: Bilateral crutches, locking stairs.   OCCUPATION: Has worked 3 jobs. 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   PLOF: Independent, basketball recreationally, playing sports with son.    PATIENT GOALS: Reduce pain, get back to work/life. Get stronger  OBJECTIVE:   PATIENT SURVEYS:  Patient-Specific Activity Scoring Scheme  0 represents "unable to perform." 10 represents "able to perform at prior level. 0 1 2 3 4 5 6 7 8 9  10 (Date and Score)   Activity Eval  08/25/2023    1. Walking independently  0    2. Stairs  4    3. work 0   4.Recreational sport 0   5.    Score 1 avg    Total score = sum of the activity scores/number of activities Minimum detectable change (90%CI) for average score = 2 points Minimum detectable change (90%CI) for single  activity score = 3 points  COGNITION: 08/25/2023 Overall cognitive status: WFL    SENSATION: 08/25/2023 WFL  EDEMA:  08/25/2023 Localized edema Rt knee  MUSCLE LENGTH: 08/25/2023 No specific testing.   POSTURE:  08/25/2023 Unremarkable   PALPATION: 08/25/2023 Mild tenderness around incision.   LOWER EXTREMITY ROM:   ROM Right Eval 08/25/2023 Left Eval 08/25/2023 Right 09/15/2023  Knee flexion 60 AROM in supine heel slide with pain    Knee extension -16 AROM in seated LAQ  -9 in supine heel prop  -5 in seated LAQ AROM Rt   (Blank rows = not tested)  LOWER EXTREMITY MMT:  MMT Right Eval 08/25/2023 Left Eval 08/25/2023  Hip flexion 3+/5 5/5  Hip extension     Hip abduction    Hip adduction    Hip internal rotation    Hip external rotation    Knee flexion 4/5 5/5  Knee extension NT 5/5  Ankle dorsiflexion 5/5   Ankle plantarflexion    Ankle inversion    Ankle eversion     (Blank rows = not tested)  LOWER EXTREMITY SPECIAL TESTS:  08/25/2023 No specific testing.   FUNCTIONAL TESTS:  08/25/2023 18 inch chair transfer: Unable unassist  Lt SLS: not tested Rt SLS: unable   GAIT: 09/15/2023: One axillary crutch with brace 0-60 deg.   08/25/2023 Hinge brace 0-60 degrees with bilateral axillary crutches.  Step to pattern primarily with partial WB noted on Rt leg. Associated deviations due to brace locking at 60 deg (hip circumduction, hike etc)  BFR Borderline cuff 4 or 5.  Selected 5 for assessment. LOP in supine 216 mmHg                                                                                                                                                                       TODAY'S TREATMENT:      DATE: 09/15/2023 Therex: Nustep lvl 6 10 mins UE/LE for ROM, endurance with 60 deg brace on   BFR at 172 mmHG (80% LOP) with cuff size 5 - additional time required for education, setup, LOP gathering and performance.  Supine quad set Rt 5 sec on/off 3 mins Supine SAQ x 19,  x15, x 15 with 30 sec breaks    Neuro Re-ed Seated 45 deg Rt knee flexion alternating isometrics painfree, submax for extension and flexion 5 sec each x 12 each way Standing with brace on Rt knee tandem stance on foam 1 min x 2 bilateral with occasional HHA on bar SLS on black mat with contralateral leg tapping corners x 8 bilaterally  Retro step on Rt leg posterior x 15 in // bars with occasional HHA   Vaso 10 mins Rt knee in elevation 34 deg medium  compression for swelling.   TODAY'S TREATMENT:      DATE: 09/12/23 TherEx NuStep L6 x 8 min; seat 15 Seated quad set 2x10; 5 sec hold Seated SLR 2x10; cues to decrease extensor lag Long sitting/supine SLR  2x10 with 3 sec hold Supine SAQ 4# 2x10; 3 sec hold  Modalities Vaso x 10 mins Rt knee in elevation 34 deg medium compression for swelling.   TODAY'S TREATMENT:      DATE:09/06/2023 Therex: Nustep LEs only for ROM & focus on RLE using quads for ext / press out motion  (0-60 degrees with brace) on lvl 8 for 8 mins Supine Rt leg quad set with SLR x 10   Neuro Re-ed Standing with single crutch partial weight on Rt leg terminal knee ext with ankle DF  red theraband 5 sec hold x 10 reps Seated LAQ with KO limiting flexion PT end range assist / isometric hold and strap assisted eccentric 10 reps Supine SLR with quad set initially and reset at top of lift and controlled lowering 5 reps.   Orthotic Training: PT instructed pt in KO aligning mechanical joint with his joint, lock/unlock on both medial & lateral joint, uprights velcro so along lateral / medial femur & tibia.  Pt verbalized understanding.  Axillary crutch use in Lt UE single with cues for sequencing and techniques.  Trial in clinic household distances < 100 ft.  Vaso 10 mins Rt knee in elevation 34 deg medium compression for swelling.   TODAY'S TREATMENT:      DATE:08/29/2023 Therex: Nustep for ROM with 0-60 degrees with brace on lvl 8 mins Incline gastroc stretch 30 sec x 3 bilateral in standing Supine Rt leg quad set with SLR x 10   Neuro Re-ed Seated Rt leg quad set 5 sec hold x 10  Seated 45 deg Rt knee flexion alternating isometrics painfree, submax for extension and flexion 5 sec each x 12 each way Supine quad set Rt leg 5 sec hold x 10   Gait Training Axillary crutch use in Lt UE single with cues for sequencing and techniques.  Trial in clinic household distances < 100 ft.  Vaso 10 mins Rt knee in elevation 34 deg medium compression for swelling.    TODAY'S TREATMENT:      DATE: 08/26/23 Medial-lateral patella mobs per protocol  Knee extension overpressure stretches with bolster under heel   Checked quad set form  for HEP/cues to reduce glute activation, then 10x3 second holds SLRs 2x10 0#  SAQs 2x10 0#  Sidelying hip ABD 2x10 0#  Adjusted brace/educated on proper fit, educated on expected course of recovery after patellar tendon recovery and time needed for full recovery especially with soft tissue surgeries/injuries   PATIENT EDUCATION:  08/25/2023 Education details: HEP, POC Person educated: Patient Education method: Programmer, multimedia, Demonstration, Verbal cues, and Handouts Education comprehension: verbalized understanding, returned demonstration, and verbal cues required  HOME EXERCISE PROGRAM: Access Code: [email or web address] URL: [email or web address] Date: 08/25/2023 Prepared by: Ozell Silvan  Exercises - Supine Heel Slide  - 3-5 x daily - 7 x weekly - 1 sets - 10 reps - 5 hold - Seated Long Arc Quad  - 3-5 x daily - 7 x weekly - 1 sets - 5-10 reps - 2 hold - Supine Knee Extension Mobilization with Weight  - 4-5 x daily - 7 x weekly - 1 sets - 1 reps - to tolerance up to 5 mins hold - Supine Quadricep Sets  - 3-5 x daily - 7 x  weekly - 1 sets - 10 reps - 5 hold - Seated Quad Set  - 3-5 x daily - 7 x weekly - 1 sets - 10 reps - 5 hold   ASSESSMENT:  CLINICAL IMPRESSION: Introduction of blood flow in visit today to progress and improve quad strengthening in available range.  Fair tolerance but able to perform in several exercises.  Pt to continue to benefit from progressive strengthening and balance improvements.   OBJECTIVE IMPAIRMENTS: Abnormal gait, decreased activity tolerance, decreased balance, decreased coordination, decreased endurance, decreased mobility, difficulty walking, decreased ROM, decreased strength, hypomobility, increased edema, increased fascial restrictions, impaired perceived functional ability, increased muscle spasms, impaired flexibility, improper body mechanics, and pain.   ACTIVITY LIMITATIONS: carrying, lifting, bending, sitting, standing, squatting,  sleeping, stairs, transfers, bed mobility, and locomotion level  PARTICIPATION LIMITATIONS: meal prep, cleaning, laundry, interpersonal relationship, driving, shopping, community activity, occupation, and yard work  PERSONAL FACTORS: Anxiety, HTN are also affecting patient's functional outcome.   REHAB POTENTIAL: Good  CLINICAL DECISION MAKING: Stable/uncomplicated  EVALUATION COMPLEXITY: Low   GOALS: Goals reviewed with patient? Yes  SHORT TERM GOALS: (target date for Short term goals are 3 weeks 09/15/2023)   1.  Patient will demonstrate independent use of home exercise program to maintain progress from in clinic treatments.  Goal status: Ongoing   09/06/2023  LONG TERM GOALS: (target dates for all long term goals are 10 weeks  11/03/2023 )   1. Patient will demonstrate/report pain at worst less than or equal to 2/10 to facilitate minimal limitation in daily activity secondary to pain symptoms.  Goal status: Ongoing   09/06/2023   2. Patient will demonstrate independent use of home exercise program to facilitate ability to maintain/progress functional gains from skilled physical therapy services.  Goal status: Ongoing   09/06/2023   3. Patient will demonstrate Patient specific functional scale avg > or = 8/10 to indicate reduced disability due to condition.   Goal status: Ongoing   09/06/2023   4.  Patient will demonstrate Rt LE MMT 5/5, dynamometry within 15 % of Lt for knee extension to faciltiate usual transfers, stairs, squatting at PLOF for daily life.   (No testing on eval for dynamometry due to surgical protocol)  Goal status: Ongoing   09/06/2023   5.  Patient will demonstrate independent ambulation > 500 ft for community integration at [facility].  Goal status:   Ongoing   09/06/2023   6.  Patient will demonstrate ascending/descending stairs reciprocally s UE assist for community integration.   Goal status: Ongoing   09/06/2023   7.  Patient will demonstrate SLS bilateral >  30 seconds for stability in ambualtion.  Goal Status: Ongoing   09/06/2023   PLAN:  PT FREQUENCY: 2x/week  PT DURATION: 10 weeks  PLANNED INTERVENTIONS: Can include 02853- PT Re-evaluation, 97110-Therapeutic exercises, 97530- Therapeutic activity, 97112- Neuromuscular re-education, 97535- Self Care, 97140- Manual therapy, [phone]- Gait training, [phone]- Orthotic Fit/training, [phone]- Canalith repositioning, [id number]- Aquatic Therapy, [phone]- Electrical stimulation (unattended), [phone]- Electrical stimulation (manual), [id number] Physical performance testing, [id number]- Vasopneumatic device, [id number]- Ultrasound, [id number]- Traction (mechanical), [id number]- Ionotophoresis 4mg /ml Dexamethasone , Patient/Family education, Balance training, Stair training, Taping, Dry Needling, Joint mobilization, Joint manipulation, Spinal manipulation, Spinal mobilization, Scar mobilization, Vestibular training, Visual/preceptual remediation/compensation, DME instructions, Cryotherapy, and Moist heat.  All performed as medically necessary.  All included unless contraindicated  PLAN FOR NEXT SESSION:    Return to PA after next visit.  BFR as able.    NO loaded CKC with weight  at this time, no weight loaded OKC extension per protocol recommendations.   Hinge brace 0-60 degrees from PA visit on 08/23/2023 until return visit in approx. 4 weeks.  No in house protocol directly provided.  Respected protocol from [facility] in folder bin in clinic.    Ozell Silvan, PT, DPT, OCS, ATC 09/15/23  3:47 PM

## 2023-09-19 ENCOUNTER — Encounter: Payer: Self-pay | Admitting: Rehabilitative and Restorative Service Providers"

## 2023-09-19 ENCOUNTER — Ambulatory Visit (INDEPENDENT_AMBULATORY_CARE_PROVIDER_SITE_OTHER): Admitting: Rehabilitative and Restorative Service Providers"

## 2023-09-19 DIAGNOSIS — M25561 Pain in right knee: Secondary | ICD-10-CM

## 2023-09-19 DIAGNOSIS — M25661 Stiffness of right knee, not elsewhere classified: Secondary | ICD-10-CM

## 2023-09-19 DIAGNOSIS — M6281 Muscle weakness (generalized): Secondary | ICD-10-CM

## 2023-09-19 DIAGNOSIS — R600 Localized edema: Secondary | ICD-10-CM

## 2023-09-19 DIAGNOSIS — R262 Difficulty in walking, not elsewhere classified: Secondary | ICD-10-CM | POA: Diagnosis not present

## 2023-09-19 NOTE — Therapy (Signed)
 OUTPATIENT PHYSICAL THERAPY TREATMENT    Patient Name: Dale Fitzgerald MRN: [id number] DOB:Jan 22, 1986, 38 y.o., male Today's Date: 09/19/2023  END OF SESSION:  PT End of Session - 09/19/23 1105     Visit Number 7    Number of Visits 20    Date for PT Re-Evaluation 11/03/23    Authorization Type [employer] $40 copay    Progress Note Due on Visit 10    PT Start Time 1057    PT Stop Time 1146    PT Time Calculation (min) 49 min    Activity Tolerance Patient limited by pain    Behavior During Therapy [facility] for tasks assessed/performed                Past Medical History:  Diagnosis Date   Anxiety    Hypertension    Past Surgical History:  Procedure Laterality Date   APPENDECTOMY     Patient Active Problem List   Diagnosis Date Noted   Patellar tendon rupture, right, initial encounter 07/12/2023   Depression     PCP: No provider listed in epic.   REFERRING PROVIDER: Jule Ronal LITTIE DEVONNA  REFERRING DIAG: [phone] (ICD-10-CM) - Patellar tendon rupture, right, initial encounter  THERAPY DIAG:  Acute pain of right knee  Muscle weakness (generalized)  Stiffness of right knee, not elsewhere classified  Difficulty in walking, not elsewhere classified  Localized edema  Rationale for Evaluation and Treatment: Rehabilitation  ONSET DATE: Surgery 07/21/2023  SUBJECTIVE:   SUBJECTIVE STATEMENT: Pt indicated feeling complaints of pain in medial joint line Rt knee with movement and at rest.  Up to 5-6/10.  Pt indicated nothing specific to help it feel better when aggravated.  Reported trying to rest it some to help it.  Pt indicated not feeling that type of irritation around anterior knee quad tendon.  Can notice buckling happens still.  Pt indicated some walking around house without crutches.   Reported some days are better with better walking and activity tolerance.      PERTINENT HISTORY: Right patella tendon rupture 07/11/2023 with surgery 07/21/2023. Had been wearing  hinge brace 0-30 degrees prior to 08/23/2023 PA visit.  Plan for follow up in 4 weeks for brace unlocking to 90 deg.  Currently set from 0-60.    PAIN:  NPRS scale: at worst from weekend 5-6/10 Pain location: Rt knee  Pain description: shock, tight Aggravating factors: end ranges, WB pressure Relieving factors: has used heat but not   PRECAUTIONS: Knee - No in house protocol directly provided.  Respected protocol from [facility] in folder bin in clinic.   WEIGHT BEARING RESTRICTIONS: Brace 0-60 from 08/23/2023 until return to PA in approx. 4 weeks.   FALLS:  Has patient fallen in last 6 months? Yes. Number of falls 1 - current injury  LIVING ENVIRONMENT: Lives in: House/apartment Stairs: Flight of stairs in house.    Outside small step for porch without rail Has following equipment at home: Bilateral crutches, locking stairs.   OCCUPATION: Has worked 3 jobs. 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   PLOF: Independent, basketball recreationally, playing sports with son.    PATIENT GOALS: Reduce pain, get back to work/life. Get stronger  OBJECTIVE:   PATIENT SURVEYS:  Patient-Specific Activity Scoring Scheme  0 represents "unable to perform." 10 represents "able to perform at prior level. 0 1 2 3 4 5 6 7 8 9  10 (Date and  Score)   Activity Eval  08/25/2023    1. Walking independently  0    2. Stairs  4    3. work 0   4.Recreational sport 0   5.    Score 1 avg    Total score = sum of the activity scores/number of activities Minimum detectable change (90%CI) for average score = 2 points Minimum detectable change (90%CI) for single activity score = 3 points  COGNITION: 08/25/2023 Overall cognitive status: WFL    SENSATION: 08/25/2023 WFL  EDEMA:  08/25/2023 Localized edema Rt knee  MUSCLE LENGTH: 08/25/2023 No specific testing.   POSTURE:  08/25/2023 Unremarkable   PALPATION: 08/25/2023 Mild tenderness around incision.    LOWER EXTREMITY ROM:   ROM Right Eval 08/25/2023 Left Eval 08/25/2023 Right 09/15/2023  Knee flexion 60 AROM in supine heel slide with pain    Knee extension -16 AROM in seated LAQ  -9 in supine heel prop  -5 in seated LAQ AROM Rt   (Blank rows = not tested)  LOWER EXTREMITY MMT:  MMT Right Eval 08/25/2023 Left Eval 08/25/2023 Right 09/19/2023  Hip flexion 3+/5 5/5 5/5  Hip extension     Hip abduction     Hip adduction     Hip internal rotation     Hip external rotation     Knee flexion 4/5 5/5 4/5  Knee extension NT 5/5 3+/5  Ankle dorsiflexion 5/5  5/5  Ankle plantarflexion     Ankle inversion     Ankle eversion      (Blank rows = not tested)  LOWER EXTREMITY SPECIAL TESTS:  08/25/2023 No specific testing.   FUNCTIONAL TESTS:  08/25/2023 18 inch chair transfer: Unable unassist  Lt SLS: not tested Rt SLS: unable   GAIT: 09/15/2023: One axillary crutch with brace 0-60 deg.   08/25/2023 Hinge brace 0-60 degrees with bilateral axillary crutches.  Step to pattern primarily with partial WB noted on Rt leg. Associated deviations due to brace locking at 60 deg (hip circumduction, hike etc)  BFR Borderline cuff 4 or 5.  Selected 5 for assessment. LOP in supine 216 mmHg                                                                                                                                                                       TODAY'S TREATMENT:      DATE: 09/19/2023 Therex: Nustep lvl 6 10 mins UE/LE for ROM, endurance with 60 deg brace on Attempted SLR in seated/supine x 2 , stopped due to pain symptoms  BFR at 172 mmHG (80% LOP) with cuff size 4 (use 5 for future visits  Seated Rt leg LAQ x 30, 3 x 15 with 30 sec rest breaks. Supine quad  set Rt 5 sec on/off 2.5 mins x 2          Supine bridge with brace on x 15 2-3 sec hold    Vaso 10 mins Rt knee in elevation 34 deg medium compression for swelling.   TODAY'S TREATMENT:      DATE:  09/15/2023 Therex: Nustep lvl 6 10 mins UE/LE for ROM, endurance with 60 deg brace on   BFR at 172 mmHG (80% LOP) with cuff size 5 - additional time required for education, setup, LOP gathering and performance.  Supine quad set Rt 5 sec on/off 3 mins Supine SAQ x 19,  x15, x 15 with 30 sec breaks    Neuro Re-ed Seated 45 deg Rt knee flexion alternating isometrics painfree, submax for extension and flexion 5 sec each x 12 each way Standing with brace on Rt knee tandem stance on foam 1 min x 2 bilateral with occasional HHA on bar SLS on black mat with contralateral leg tapping corners x 8 bilaterally  Retro step on Rt leg posterior x 15 in // bars with occasional HHA   Vaso 10 mins Rt knee in elevation 34 deg medium compression for swelling.   TODAY'S TREATMENT:      DATE: 09/12/23 TherEx NuStep L6 x 8 min; seat 15 Seated quad set 2x10; 5 sec hold Seated SLR 2x10; cues to decrease extensor lag Long sitting/supine SLR 2x10 with 3 sec hold Supine SAQ 4# 2x10; 3 sec hold  Modalities Vaso x 10 mins Rt knee in elevation 34 deg medium compression for swelling.     PATIENT EDUCATION:  08/25/2023 Education details: HEP, POC Person educated: Patient Education method: Programmer, multimedia, Demonstration, Verbal cues, and Handouts Education comprehension: verbalized understanding, returned demonstration, and verbal cues required  HOME EXERCISE PROGRAM: Access Code: [email or web address] URL: [email or web address] Date: 08/25/2023 Prepared by: Ozell Silvan  Exercises - Supine Heel Slide  - 3-5 x daily - 7 x weekly - 1 sets - 10 reps - 5 hold - Seated Long Arc Quad  - 3-5 x daily - 7 x weekly - 1 sets - 5-10 reps - 2 hold - Supine Knee Extension Mobilization with Weight  - 4-5 x daily - 7 x weekly - 1 sets - 1 reps - to tolerance up to 5 mins hold - Supine Quadricep Sets  - 3-5 x daily - 7 x weekly - 1 sets - 10 reps - 5 hold - Seated Quad Set  - 3-5 x daily - 7 x weekly - 1 sets - 10 reps  - 5 hold   ASSESSMENT:  CLINICAL IMPRESSION: Attempted SLR in sitting and supine today with distal patellar ligament symptoms were noted in attempt.  Continued use of BFR and other muscle activation/strengthening to promote improved quad strength within allowed movement.  Pt to return to PA prior to next therapy visit.  Plan to check for updates on range and progression.    OBJECTIVE IMPAIRMENTS: Abnormal gait, decreased activity tolerance, decreased balance, decreased coordination, decreased endurance, decreased mobility, difficulty walking, decreased ROM, decreased strength, hypomobility, increased edema, increased fascial restrictions, impaired perceived functional ability, increased muscle spasms, impaired flexibility, improper body mechanics, and pain.   ACTIVITY LIMITATIONS: carrying, lifting, bending, sitting, standing, squatting, sleeping, stairs, transfers, bed mobility, and locomotion level  PARTICIPATION LIMITATIONS: meal prep, cleaning, laundry, interpersonal relationship, driving, shopping, community activity, occupation, and yard work  PERSONAL FACTORS: Anxiety, HTN are also affecting patient's functional outcome.   REHAB POTENTIAL: Good  CLINICAL DECISION MAKING: Stable/uncomplicated  EVALUATION COMPLEXITY: Low   GOALS: Goals reviewed with patient? Yes  SHORT TERM GOALS: (target date for Short term goals are 3 weeks 09/15/2023)   1.  Patient will demonstrate independent use of home exercise program to maintain progress from in clinic treatments.  Goal status: Ongoing   09/06/2023  LONG TERM GOALS: (target dates for all long term goals are 10 weeks  11/03/2023 )   1. Patient will demonstrate/report pain at worst less than or equal to 2/10 to facilitate minimal limitation in daily activity secondary to pain symptoms.  Goal status: Ongoing   09/06/2023   2. Patient will demonstrate independent use of home exercise program to facilitate ability to maintain/progress  functional gains from skilled physical therapy services.  Goal status: Ongoing   09/06/2023   3. Patient will demonstrate Patient specific functional scale avg > or = 8/10 to indicate reduced disability due to condition.   Goal status: Ongoing   09/06/2023   4.  Patient will demonstrate Rt LE MMT 5/5, dynamometry within 15 % of Lt for knee extension to faciltiate usual transfers, stairs, squatting at PLOF for daily life.   (No testing on eval for dynamometry due to surgical protocol)  Goal status: Ongoing   09/06/2023   5.  Patient will demonstrate independent ambulation > 500 ft for community integration at [facility].  Goal status:   Ongoing   09/06/2023   6.  Patient will demonstrate ascending/descending stairs reciprocally s UE assist for community integration.   Goal status: Ongoing   09/06/2023   7.  Patient will demonstrate SLS bilateral > 30 seconds for stability in ambualtion.  Goal Status: Ongoing   09/06/2023   PLAN:  PT FREQUENCY: 2x/week  PT DURATION: 10 weeks  PLANNED INTERVENTIONS: Can include 02853- PT Re-evaluation, 97110-Therapeutic exercises, 97530- Therapeutic activity, 97112- Neuromuscular re-education, 97535- Self Care, 97140- Manual therapy, [phone]- Gait training, [phone]- Orthotic Fit/training, [phone]- Canalith repositioning, [id number]- Aquatic Therapy, [id number]- Electrical stimulation (unattended), [phone]- Electrical stimulation (manual), [id number] Physical performance testing, 97016- Vasopneumatic device, [id number]- Ultrasound, [id number]- Traction (mechanical), [id number]- Ionotophoresis 4mg /ml Dexamethasone , Patient/Family education, Balance training, Stair training, Taping, Dry Needling, Joint mobilization, Joint manipulation, Spinal manipulation, Spinal mobilization, Scar mobilization, Vestibular training, Visual/preceptual remediation/compensation, DME instructions, Cryotherapy, and Moist heat.  All performed as medically necessary.  All included unless contraindicated  PLAN FOR NEXT SESSION:     Return to PA follow up.    NO loaded CKC with weight at this time, no weight loaded OKC extension per protocol recommendations.   Hinge brace 0-60 degrees from PA visit on 08/23/2023 until return visit in approx. 4 weeks.  No in house protocol directly provided.  Respected protocol from [facility] in folder bin in clinic.    Ozell Silvan, PT, DPT, OCS, ATC 09/19/23  11:37 AM

## 2023-09-20 ENCOUNTER — Encounter: Payer: Self-pay | Admitting: Physician Assistant

## 2023-09-20 ENCOUNTER — Ambulatory Visit (INDEPENDENT_AMBULATORY_CARE_PROVIDER_SITE_OTHER): Admitting: Physician Assistant

## 2023-09-20 DIAGNOSIS — S86811A Strain of other muscle(s) and tendon(s) at lower leg level, right leg, initial encounter: Secondary | ICD-10-CM

## 2023-09-20 NOTE — Progress Notes (Signed)
   Post-Op Visit Note   Patient: Dale Fitzgerald           Date of Birth: May 24, 1985           MRN: [id number] Visit Date: 09/20/2023 PCP: Patient, No Pcp Per   Assessment & Plan:  Chief Complaint:  Chief Complaint  Patient presents with   Right Knee - Follow-up    right patellar tendon repair, date of surgery 07/21/2023   Visit Diagnoses:  1. Patellar tendon rupture, right, initial encounter     Plan: Patient is a pleasant 38 year old gentleman who comes in today approximately 8 weeks status post right patellar tendon repair, date of surgery 07/21/2023.  He has been doing better.  He has been compliant wearing his hinged knee brace locked from 0 to 60 degrees.  He still has occasional discomfort which is relieved with as needed ibuprofen.  He has been in physical therapy twice a week.  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  He tells me there is no light duty or desk work option.  At this point, have unlocked his brace to 90 degrees of flexion.  He will continue with physical therapy.  He will continue to remain out of work for another 5 weeks.  Follow-up in 4 weeks for recheck.  Call with concerns or questions.  Follow-Up Instructions: Return in about 4 weeks (around 10/18/2023).   Orders:  No orders of the defined types were placed in this encounter.  No orders of the defined types were placed in this encounter.   Imaging: No new imaging  PMFS History: Patient Active Problem List   Diagnosis Date Noted   Patellar tendon rupture, right, initial encounter 07/12/2023   Depression    Past Medical History:  Diagnosis Date   Anxiety    Hypertension     No family history on file.  Past Surgical History:  Procedure Laterality Date   APPENDECTOMY     Social History   Occupational History   Not on file  Tobacco Use   Smoking status: Every Day    Current packs/day: 0.50     Average packs/day: 0.5 packs/day for 10.0 years (5.0 ttl pk-yrs)    Types: Cigarettes   Smokeless tobacco: Not on file  Vaping Use   Vaping status: Never Used  Substance and Sexual Activity   Alcohol use: Yes   Drug use: Yes    Types: Marijuana   Sexual activity: Not on file

## 2023-09-21 ENCOUNTER — Encounter: Admitting: Rehabilitative and Restorative Service Providers"

## 2023-09-22 ENCOUNTER — Encounter: Payer: Self-pay | Admitting: Rehabilitative and Restorative Service Providers"

## 2023-09-22 ENCOUNTER — Ambulatory Visit: Admitting: Rehabilitative and Restorative Service Providers"

## 2023-09-22 DIAGNOSIS — M25661 Stiffness of right knee, not elsewhere classified: Secondary | ICD-10-CM

## 2023-09-22 DIAGNOSIS — R600 Localized edema: Secondary | ICD-10-CM

## 2023-09-22 DIAGNOSIS — M6281 Muscle weakness (generalized): Secondary | ICD-10-CM

## 2023-09-22 DIAGNOSIS — R262 Difficulty in walking, not elsewhere classified: Secondary | ICD-10-CM

## 2023-09-22 DIAGNOSIS — M25561 Pain in right knee: Secondary | ICD-10-CM

## 2023-09-22 NOTE — Therapy (Signed)
 OUTPATIENT PHYSICAL THERAPY TREATMENT    Patient Name: Dale Fitzgerald MRN: [id number] DOB:[birth year], 38 y.o., male Today's Date: 09/22/2023  END OF SESSION:  PT End of Session - 09/22/23 1520     Visit Number 8    Number of Visits 20    Date for PT Re-Evaluation 11/03/23    Authorization Type [employer] $40 copay    Progress Note Due on Visit 10    PT Start Time 1516    PT Stop Time 1559    PT Time Calculation (min) 43 min    Activity Tolerance Patient limited by pain    Behavior During Therapy [facility] for tasks assessed/performed                 Past Medical History:  Diagnosis Date   Anxiety    Hypertension    Past Surgical History:  Procedure Laterality Date   APPENDECTOMY     Patient Active Problem List   Diagnosis Date Noted   Patellar tendon rupture, right, initial encounter 07/12/2023   Depression     PCP: No provider listed in epic.   REFERRING PROVIDER: Jule Ronal LITTIE DEVONNA  REFERRING DIAG: [id number] (ICD-10-CM) - Patellar tendon rupture, right, initial encounter  THERAPY DIAG:  Acute pain of right knee  Muscle weakness (generalized)  Stiffness of right knee, not elsewhere classified  Difficulty in walking, not elsewhere classified  Localized edema  Rationale for Evaluation and Treatment: Rehabilitation  ONSET DATE: Surgery 07/21/2023  SUBJECTIVE:   SUBJECTIVE STATEMENT: Pt indicated continued discomfort in Rt knee.  Reported seeing PA office for 2-3 mins and didn't get much address for symptoms.  Pt indicated having some trouble with pain in HEP.  Reported having shock feeling at times as well.  Reported to 0-90 deg brace unlock.      PERTINENT HISTORY: Right patella tendon rupture 07/11/2023 with surgery 07/21/2023. Had been wearing hinge brace 0-30 degrees prior to 08/23/2023 PA visit.  Plan for follow up in 4 weeks for brace unlocking to 90 deg.  Currently set from 0-60.    PAIN:  NPRS scale: upon arrival with walking 4-5/10 Pain location:  Rt knee  Pain description: shock, tight Aggravating factors: end ranges, WB pressure Relieving factors: has used heat but not   PRECAUTIONS: Knee - No in house protocol directly provided.  Respected protocol from [facility] in folder bin in clinic.   WEIGHT BEARING RESTRICTIONS: Brace 0-90 per PA visit on 09/20/2023   FALLS:  Has patient fallen in last 6 months? Yes. Number of falls 1 - current injury  LIVING ENVIRONMENT: Lives in: House/apartment Stairs: Flight of stairs in house.    Outside small step for porch without rail Has following equipment at home: Bilateral crutches, locking stairs.   OCCUPATION: Has worked 3 jobs. 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   PLOF: Independent, basketball recreationally, playing sports with son.    PATIENT GOALS: Reduce pain, get back to work/life. Get stronger  OBJECTIVE:   PATIENT SURVEYS:  Patient-Specific Activity Scoring Scheme  0 represents "unable to perform." 10 represents "able to perform at prior level. 0 1 2 3 4 5 6 7 8 9  10 (Date and Score)   Activity Eval  08/25/2023    1. Walking independently  0    2. Stairs  4    3. work 0   4.Recreational sport 0   5.    Score 1  avg    Total score = sum of the activity scores/number of activities Minimum detectable change (90%CI) for average score = 2 points Minimum detectable change (90%CI) for single activity score = 3 points  COGNITION: 08/25/2023 Overall cognitive status: WFL    SENSATION: 08/25/2023 WFL  EDEMA:  08/25/2023 Localized edema Rt knee  MUSCLE LENGTH: 08/25/2023 No specific testing.   POSTURE:  08/25/2023 Unremarkable   PALPATION: 08/25/2023 Mild tenderness around incision.   LOWER EXTREMITY ROM:   ROM Right Eval 08/25/2023 Left Eval 08/25/2023 Right 09/15/2023  Knee flexion 60 AROM in supine heel slide with pain    Knee extension -16 AROM in seated LAQ  -9 in supine heel prop  -5 in seated LAQ AROM Rt    (Blank rows = not tested)  LOWER EXTREMITY MMT:  MMT Right Eval 08/25/2023 Left Eval 08/25/2023 Right 09/19/2023  Hip flexion 3+/5 5/5 5/5  Hip extension     Hip abduction     Hip adduction     Hip internal rotation     Hip external rotation     Knee flexion 4/5 5/5 4/5  Knee extension NT 5/5 3+/5  Ankle dorsiflexion 5/5  5/5  Ankle plantarflexion     Ankle inversion     Ankle eversion      (Blank rows = not tested)  LOWER EXTREMITY SPECIAL TESTS:  08/25/2023 No specific testing.   FUNCTIONAL TESTS:  08/25/2023 18 inch chair transfer: Unable unassist  Lt SLS: not tested Rt SLS: unable   GAIT: 09/22/2023: Single axillary crutch, brace 0-90 deg.   09/15/2023: One axillary crutch with brace 0-60 deg.   08/25/2023 Hinge brace 0-60 degrees with bilateral axillary crutches.  Step to pattern primarily with partial WB noted on Rt leg. Associated deviations due to brace locking at 60 deg (hip circumduction, hike etc)  BFR Borderline cuff 4 or 5.  Selected 5 for assessment. LOP in supine 216 mmHg                                                                                                                                                                       TODAY'S TREATMENT:      DATE: 09/19/2023 Therex: Seated Rt leg LAQ 2 x 12 Seated quad set with SLR Rt leg Time spent in review of protocol with knee brace adjustment.  Encouraged addition of SLR for at home use pending symptoms.  Cues given for exercise routine.  Sit to stand to sit with brace on 0-90 deg x 10 - 26 inches.    Neuro Re-ed (muscle activation, neural recruitment, balance improvements) Seated Rt knee isometric flexion/extension in 80 deg, 50 deg 5 sec alternating x 12 each Seated Rt leg quad set 5 sec  hold x 10    Vaso 10 mins Rt knee in elevation 34 deg medium compression for swelling.   Education on HEP given during use.  TODAY'S TREATMENT:      DATE: 09/15/2023 Therex: Nustep lvl 6 10 mins UE/LE for  ROM, endurance with 60 deg brace on   BFR at 172 mmHG (80% LOP) with cuff size 5 - additional time required for education, setup, LOP gathering and performance.  Supine quad set Rt 5 sec on/off 3 mins Supine SAQ x 19,  x15, x 15 with 30 sec breaks    Neuro Re-ed Seated 45 deg Rt knee flexion alternating isometrics painfree, submax for extension and flexion 5 sec each x 12 each way Standing with brace on Rt knee tandem stance on foam 1 min x 2 bilateral with occasional HHA on bar SLS on black mat with contralateral leg tapping corners x 8 bilaterally  Retro step on Rt leg posterior x 15 in // bars with occasional HHA   Vaso 10 mins Rt knee in elevation 34 deg medium compression for swelling.   TODAY'S TREATMENT:      DATE: 09/12/23 TherEx NuStep L6 x 8 min; seat 15 Seated quad set 2x10; 5 sec hold Seated SLR 2x10; cues to decrease extensor lag Long sitting/supine SLR 2x10 with 3 sec hold Supine SAQ 4# 2x10; 3 sec hold  Modalities Vaso x 10 mins Rt knee in elevation 34 deg medium compression for swelling.     PATIENT EDUCATION:  09/22/2023 Education details: HEP update Person educated: Patient Education method: Programmer, multimedia, Demonstration, Verbal cues, and Handouts Education comprehension: verbalized understanding, returned demonstration, and verbal cues required  HOME EXERCISE PROGRAM: Access Code: [email or web address] URL: [email or web address] Date: 09/22/2023 Prepared by: Ozell Silvan  Exercises - Supine Heel Slide  - 3-5 x daily - 7 x weekly - 1 sets - 10 reps - 5 hold - Seated Long Arc Quad  - 3-5 x daily - 7 x weekly - 1 sets - 5-10 reps - 2 hold - Supine Knee Extension Mobilization with Weight  - 4-5 x daily - 7 x weekly - 1 sets - 1 reps - to tolerance up to 5 mins hold - Supine Quadricep Sets  - 3-5 x daily - 7 x weekly - 1 sets - 10 reps - 5 hold - Seated Quad Set  - 3-5 x daily - 7 x weekly - 1 sets - 10 reps - 5 hold - Seated Straight Leg Heel Taps  - 1-2  x daily - 7 x weekly - 3 sets - 10 reps   ASSESSMENT:  CLINICAL IMPRESSION: Pt tolerated increase use of quad activation in activity today with successful use of SLR.  Introduced adjusted height sit to stand to include WB activity with brace allowance.  Held BFR due to additions of intervention.  Continued skilled PT services warranted at this time.   OBJECTIVE IMPAIRMENTS: Abnormal gait, decreased activity tolerance, decreased balance, decreased coordination, decreased endurance, decreased mobility, difficulty walking, decreased ROM, decreased strength, hypomobility, increased edema, increased fascial restrictions, impaired perceived functional ability, increased muscle spasms, impaired flexibility, improper body mechanics, and pain.   ACTIVITY LIMITATIONS: carrying, lifting, bending, sitting, standing, squatting, sleeping, stairs, transfers, bed mobility, and locomotion level  PARTICIPATION LIMITATIONS: meal prep, cleaning, laundry, interpersonal relationship, driving, shopping, community activity, occupation, and yard work  PERSONAL FACTORS: Anxiety, HTN are also affecting patient's functional outcome.   REHAB POTENTIAL: Good  CLINICAL DECISION MAKING: Stable/uncomplicated  EVALUATION COMPLEXITY: Low   GOALS:  Goals reviewed with patient? Yes  SHORT TERM GOALS: (target date for Short term goals are 3 weeks 09/15/2023)   1.  Patient will demonstrate independent use of home exercise program to maintain progress from in clinic treatments.  Goal status: Ongoing   09/06/2023  LONG TERM GOALS: (target dates for all long term goals are 10 weeks  11/03/2023 )   1. Patient will demonstrate/report pain at worst less than or equal to 2/10 to facilitate minimal limitation in daily activity secondary to pain symptoms.  Goal status: Ongoing   09/06/2023   2. Patient will demonstrate independent use of home exercise program to facilitate ability to maintain/progress functional gains from skilled  physical therapy services.  Goal status: Ongoing   09/06/2023   3. Patient will demonstrate Patient specific functional scale avg > or = 8/10 to indicate reduced disability due to condition.   Goal status: Ongoing   09/06/2023   4.  Patient will demonstrate Rt LE MMT 5/5, dynamometry within 15 % of Lt for knee extension to faciltiate usual transfers, stairs, squatting at PLOF for daily life.   (No testing on eval for dynamometry due to surgical protocol)  Goal status: Ongoing   09/06/2023   5.  Patient will demonstrate independent ambulation > 500 ft for community integration at [facility].  Goal status:   Ongoing   09/06/2023   6.  Patient will demonstrate ascending/descending stairs reciprocally s UE assist for community integration.   Goal status: Ongoing   09/06/2023   7.  Patient will demonstrate SLS bilateral > 30 seconds for stability in ambualtion.  Goal Status: Ongoing   09/06/2023   PLAN:  PT FREQUENCY: 2x/week  PT DURATION: 10 weeks  PLANNED INTERVENTIONS: Can include 02853- PT Re-evaluation, 97110-Therapeutic exercises, 97530- Therapeutic activity, 97112- Neuromuscular re-education, 97535- Self Care, 97140- Manual therapy, [phone]- Gait training, [phone]- Orthotic Fit/training, [phone]- Canalith repositioning, [id number]- Aquatic Therapy, [phone]- Electrical stimulation (unattended), [phone]- Electrical stimulation (manual), [id number] Physical performance testing, 97016- Vasopneumatic device, [id number]- Ultrasound, [id number]- Traction (mechanical), [id number]- Ionotophoresis 4mg /ml Dexamethasone , Patient/Family education, Balance training, Stair training, Taping, Dry Needling, Joint mobilization, Joint manipulation, Spinal manipulation, Spinal mobilization, Scar mobilization, Vestibular training, Visual/preceptual remediation/compensation, DME instructions, Cryotherapy, and Moist heat.  All performed as medically necessary.  All included unless contraindicated  PLAN FOR NEXT SESSION:   Pt 9 weeks post op today.  0-90  brace.  Intro WB strengthening watching pain response.    NO loaded CKC with weight at this time, no weight loaded OKC extension per protocol recommendations.   Hinge brace 0-90 degrees from PA visit on 09/20/2023. No in house protocol directly provided.  Respected protocol from [facility] in folder bin in clinic.    Ozell Silvan, PT, DPT, OCS, ATC 09/22/23  4:01 PM

## 2023-10-05 ENCOUNTER — Ambulatory Visit (INDEPENDENT_AMBULATORY_CARE_PROVIDER_SITE_OTHER): Admitting: Physical Therapy

## 2023-10-05 ENCOUNTER — Encounter: Payer: Self-pay | Admitting: Physical Therapy

## 2023-10-05 DIAGNOSIS — M6281 Muscle weakness (generalized): Secondary | ICD-10-CM

## 2023-10-05 DIAGNOSIS — R262 Difficulty in walking, not elsewhere classified: Secondary | ICD-10-CM | POA: Diagnosis not present

## 2023-10-05 DIAGNOSIS — M25661 Stiffness of right knee, not elsewhere classified: Secondary | ICD-10-CM | POA: Diagnosis not present

## 2023-10-05 DIAGNOSIS — M25561 Pain in right knee: Secondary | ICD-10-CM | POA: Diagnosis not present

## 2023-10-05 DIAGNOSIS — R600 Localized edema: Secondary | ICD-10-CM

## 2023-10-05 NOTE — Therapy (Addendum)
 OUTPATIENT PHYSICAL THERAPY TREATMENT    Patient Name: Dale Fitzgerald MRN: [id number] DOB:[birth year], 38 y.o., male Today's Date: 10/05/2023  END OF SESSION:  PT End of Session - 10/05/23 1104     Visit Number 9    Number of Visits 20    Date for PT Re-Evaluation 11/03/23    Authorization Type [employer] $40 copay    Progress Note Due on Visit 10    PT Start Time 1102    PT Stop Time 1145    PT Time Calculation (min) 43 min    Activity Tolerance Patient limited by pain    Behavior During Therapy [facility] for tasks assessed/performed          Past Medical History:  Diagnosis Date   Anxiety    Hypertension    Past Surgical History:  Procedure Laterality Date   APPENDECTOMY     Patient Active Problem List   Diagnosis Date Noted   Patellar tendon rupture, right, initial encounter 07/12/2023   Depression     PCP: No provider listed in epic.   REFERRING PROVIDER: Jule Ronal LITTIE DEVONNA  REFERRING DIAG: [id number] (ICD-10-CM) - Patellar tendon rupture, right, initial encounter  THERAPY DIAG:  Acute pain of right knee  Muscle weakness (generalized)  Stiffness of right knee, not elsewhere classified  Difficulty in walking, not elsewhere classified  Localized edema  Rationale for Evaluation and Treatment: Rehabilitation  ONSET DATE: Surgery 07/21/2023  SUBJECTIVE:   SUBJECTIVE STATEMENT:  Patient reports nothing new since last session. He is overall doing well but feels stiff and has some difficulty with buckling during walking.   PERTINENT HISTORY: Right patella tendon rupture 07/11/2023 with surgery 07/21/2023. Had been wearing hinge brace 0-30 degrees prior to 08/23/2023 PA visit.  Plan for follow up in 4 weeks for brace unlocking to 90 deg.  Currently set from 0-60.    PAIN:  NPRS scale: walking 5/10, at rest 2/10 Pain location: Rt knee  Pain description: shock, tight Aggravating factors: end ranges, WB pressure Relieving factors: has used heat but not   PRECAUTIONS:  Knee - No in house protocol directly provided.  Respected protocol from [facility] in folder bin in clinic.  10/05/23: per Doctors orders, okay for out of brace PROM as tolerated   WEIGHT BEARING RESTRICTIONS: Brace 0-90 per PA visit on 09/20/2023   FALLS:  Has patient fallen in last 6 months? Yes. Number of falls 1 - current injury  LIVING ENVIRONMENT: Lives in: House/apartment Stairs: Flight of stairs in house.    Outside small step for porch without rail Has following equipment at home: Bilateral crutches, locking stairs.   OCCUPATION: Has worked 3 jobs. 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   PLOF: Independent, basketball recreationally, playing sports with son.    PATIENT GOALS: Reduce pain, get back to work/life. Get stronger  OBJECTIVE:   PATIENT SURVEYS:  Patient-Specific Activity Scoring Scheme  0 represents "unable to perform." 10 represents "able to perform at prior level. 0 1 2 3 4 5 6 7 8 9  10 (Date and Score)   Activity Eval  08/25/2023    1. Walking independently  0    2. Stairs  4    3. work 0   4.Recreational sport 0   5.    Score 1 avg    Total score = sum of the activity scores/number of activities Minimum detectable change (90%CI) for average score =  2 points Minimum detectable change (90%CI) for single activity score = 3 points  COGNITION: 08/25/2023 Overall cognitive status: WFL    SENSATION: 08/25/2023 WFL  EDEMA:  08/25/2023 Localized edema Rt knee  MUSCLE LENGTH: 08/25/2023 No specific testing.   POSTURE:  08/25/2023 Unremarkable   PALPATION: 08/25/2023 Mild tenderness around incision.   LOWER EXTREMITY ROM:   ROM Right Eval 08/25/2023 Left Eval 08/25/2023 Right 09/15/2023 Right  10/05/2023  Knee flexion 60 AROM in supine heel slide with pain   P: 92 in supine with pain   Knee extension -16 AROM in seated LAQ  -9 in supine heel prop  -5 in seated LAQ AROM Rt -4 in supine with heel prop    (Blank rows = not tested)  LOWER EXTREMITY MMT:  MMT Right Eval 08/25/2023 Left Eval 08/25/2023 Right 09/19/2023  Hip flexion 3+/5 5/5 5/5  Hip extension     Hip abduction     Hip adduction     Hip internal rotation     Hip external rotation     Knee flexion 4/5 5/5 4/5  Knee extension NT 5/5 3+/5  Ankle dorsiflexion 5/5  5/5  Ankle plantarflexion     Ankle inversion     Ankle eversion      (Blank rows = not tested)  LOWER EXTREMITY SPECIAL TESTS:  08/25/2023 No specific testing.   FUNCTIONAL TESTS:  08/25/2023 18 inch chair transfer: Unable unassist  Lt SLS: not tested Rt SLS: unable   GAIT: 09/22/2023: Single axillary crutch, brace 0-90 deg.   09/15/2023: One axillary crutch with brace 0-60 deg.   08/25/2023 Hinge brace 0-60 degrees with bilateral axillary crutches.  Step to pattern primarily with partial WB noted on Rt leg. Associated deviations due to brace locking at 60 deg (hip circumduction, hike etc)  BFR Borderline cuff 4 or 5.  Selected 5 for assessment. LOP in supine 216 mmHg                                                                                                                                                                       TODAY'S TREATMENT:      DATE: 10/05/2023 Therex: Seated Rt LAQ, 2x10 Leg press with knees at 90*, BL 50# x15, 62# x 15  Attempted SLR and SAQ but too painful. PROM knee flexion/extension  Neuro Re-ed: steps to cones (ant-lat, lat & post-lat), 5 rounds each leg leading;  with KO but no AD.  Sink & chair backs for support as needed with intermittent touch.  He had shorter steps leading with RLE.   Self-Care:  Ice massage around scar for 5 minutes.  PT instructed and demonstrated for home.  Patient verbalized understanding.   TREATMENT:  DATE: 09/19/2023 Therex: Seated Rt leg LAQ 2 x 12 Seated quad set with SLR Rt leg Time spent in review of protocol with knee brace adjustment.  Encouraged addition of SLR for at home  use pending symptoms.  Cues given for exercise routine.  Sit to stand to sit with brace on 0-90 deg x 10 - 26 inches.    Neuro Re-ed (muscle activation, neural recruitment, balance improvements) Seated Rt knee isometric flexion/extension in 80 deg, 50 deg 5 sec alternating x 12 each Seated Rt leg quad set 5 sec hold x 10    Vaso 10 mins Rt knee in elevation 34 deg medium compression for swelling.   Education on HEP given during use.   TODAY'S TREATMENT:      DATE: 09/15/2023 Therex: Nustep lvl 6 10 mins UE/LE for ROM, endurance with 60 deg brace on   BFR at 172 mmHG (80% LOP) with cuff size 5 - additional time required for education, setup, LOP gathering and performance.  Supine quad set Rt 5 sec on/off 3 mins Supine SAQ x 19,  x15, x 15 with 30 sec breaks    Neuro Re-ed Seated 45 deg Rt knee flexion alternating isometrics painfree, submax for extension and flexion 5 sec each x 12 each way Standing with brace on Rt knee tandem stance on foam 1 min x 2 bilateral with occasional HHA on bar SLS on black mat with contralateral leg tapping corners x 8 bilaterally  Retro step on Rt leg posterior x 15 in // bars with occasional HHA   Vaso 10 mins Rt knee in elevation 34 deg medium compression for swelling.    PATIENT EDUCATION:  09/22/2023 Education details: HEP update Person educated: Patient Education method: Programmer, multimedia, Demonstration, Verbal cues, and Handouts Education comprehension: verbalized understanding, returned demonstration, and verbal cues required  HOME EXERCISE PROGRAM: Access Code: [email or web address] URL: [email or web address] Date: 09/22/2023 Prepared by: Ozell Silvan  Exercises - Supine Heel Slide  - 3-5 x daily - 7 x weekly - 1 sets - 10 reps - 5 hold - Seated Long Arc Quad  - 3-5 x daily - 7 x weekly - 1 sets - 5-10 reps - 2 hold - Supine Knee Extension Mobilization with Weight  - 4-5 x daily - 7 x weekly - 1 sets - 1 reps - to tolerance up to 5 mins  hold - Supine Quadricep Sets  - 3-5 x daily - 7 x weekly - 1 sets - 10 reps - 5 hold - Seated Quad Set  - 3-5 x daily - 7 x weekly - 1 sets - 10 reps - 5 hold - Seated Straight Leg Heel Taps  - 1-2 x daily - 7 x weekly - 3 sets - 10 reps   ASSESSMENT:  CLINICAL IMPRESSION:  Patient was unable to do SLR or short arc quad due to pain with contraction/extension. Introduced leg press and was able to tolerate the weight and ROM well. He had no pain with that. He experience pain with ice massage and PROM but was able to tolerate them. He improved Rt knee extension and flexion ROM. Patient will continue to benefit from skilled physical therapy to address strength, ROM, and pain.   OBJECTIVE IMPAIRMENTS: Abnormal gait, decreased activity tolerance, decreased balance, decreased coordination, decreased endurance, decreased mobility, difficulty walking, decreased ROM, decreased strength, hypomobility, increased edema, increased fascial restrictions, impaired perceived functional ability, increased muscle spasms, impaired flexibility, improper body mechanics, and pain.   ACTIVITY LIMITATIONS: carrying, lifting, bending, sitting,  standing, squatting, sleeping, stairs, transfers, bed mobility, and locomotion level  PARTICIPATION LIMITATIONS: meal prep, cleaning, laundry, interpersonal relationship, driving, shopping, community activity, occupation, and yard work  PERSONAL FACTORS: Anxiety, HTN are also affecting patient's functional outcome.   REHAB POTENTIAL: Good  CLINICAL DECISION MAKING: Stable/uncomplicated  EVALUATION COMPLEXITY: Low   GOALS: Goals reviewed with patient? Yes  SHORT TERM GOALS: (target date for Short term goals are 3 weeks 09/15/2023)   1.  Patient will demonstrate independent use of home exercise program to maintain progress from in clinic treatments.  Goal status: Ongoing   09/06/2023  LONG TERM GOALS: (target dates for all long term goals are 10 weeks  11/03/2023 )   1.  Patient will demonstrate/report pain at worst less than or equal to 2/10 to facilitate minimal limitation in daily activity secondary to pain symptoms.  Goal status: Ongoing   09/06/2023   2. Patient will demonstrate independent use of home exercise program to facilitate ability to maintain/progress functional gains from skilled physical therapy services.  Goal status: Ongoing   09/06/2023   3. Patient will demonstrate Patient specific functional scale avg > or = 8/10 to indicate reduced disability due to condition.   Goal status: Ongoing   09/06/2023   4.  Patient will demonstrate Rt LE MMT 5/5, dynamometry within 15 % of Lt for knee extension to faciltiate usual transfers, stairs, squatting at PLOF for daily life.   (No testing on eval for dynamometry due to surgical protocol)  Goal status: Ongoing   09/06/2023   5.  Patient will demonstrate independent ambulation > 500 ft for community integration at [facility].  Goal status:   Ongoing   09/06/2023   6.  Patient will demonstrate ascending/descending stairs reciprocally s UE assist for community integration.   Goal status: Ongoing   09/06/2023   7.  Patient will demonstrate SLS bilateral > 30 seconds for stability in ambualtion.  Goal Status: Ongoing   09/06/2023   PLAN:  PT FREQUENCY: 2x/week  PT DURATION: 10 weeks  PLANNED INTERVENTIONS: Can include 02853- PT Re-evaluation, 97110-Therapeutic exercises, 97530- Therapeutic activity, 97112- Neuromuscular re-education, 97535- Self Care, 97140- Manual therapy, [phone]- Gait training, [phone]- Orthotic Fit/training, [phone]- Canalith repositioning, [id number]- Aquatic Therapy, [phone]- Electrical stimulation (unattended), [phone]- Electrical stimulation (manual), [id number] Physical performance testing, 97016- Vasopneumatic device, [id number]- Ultrasound, [id number]- Traction (mechanical), [id number]- Ionotophoresis 4mg /ml Dexamethasone , Patient/Family education, Balance training, Stair training, Taping, Dry Needling, Joint  mobilization, Joint manipulation, Spinal manipulation, Spinal mobilization, Scar mobilization, Vestibular training, Visual/preceptual remediation/compensation, DME instructions, Cryotherapy, and Moist heat.  All performed as medically necessary.  All included unless contraindicated  PLAN FOR NEXT SESSION: CHECK STG, check if tried ice massage.   Intro WB strengthening within brace, watching pain response. Check in with pain during SLR and SAQ. Check gait and address buckling when walking    NO loaded CKC with weight at this time, no weight loaded OKC extension per protocol recommendations.   Hinge brace 0-90 degrees from PA visit on 09/20/2023. No in house protocol directly provided.  Respected protocol from [facility] in folder bin in clinic.  7/16: PROM out of brace as tolerated     Ismael Theophilus Stallion, Student-PT 10/05/23  12:09 PM  This entire session of physical therapy was performed under the direct supervision of PT signing evaluation /treatment. PT reviewed note and agrees.    Grayce Spatz, PT, DPT 10/05/2023, 2:43 PM

## 2023-10-07 ENCOUNTER — Encounter: Payer: Self-pay | Admitting: Rehabilitative and Restorative Service Providers"

## 2023-10-07 ENCOUNTER — Ambulatory Visit: Admitting: Rehabilitative and Restorative Service Providers"

## 2023-10-07 DIAGNOSIS — R262 Difficulty in walking, not elsewhere classified: Secondary | ICD-10-CM

## 2023-10-07 DIAGNOSIS — M25561 Pain in right knee: Secondary | ICD-10-CM | POA: Diagnosis not present

## 2023-10-07 DIAGNOSIS — M25661 Stiffness of right knee, not elsewhere classified: Secondary | ICD-10-CM | POA: Diagnosis not present

## 2023-10-07 DIAGNOSIS — M6281 Muscle weakness (generalized): Secondary | ICD-10-CM

## 2023-10-07 DIAGNOSIS — R600 Localized edema: Secondary | ICD-10-CM

## 2023-10-07 NOTE — Therapy (Signed)
 OUTPATIENT PHYSICAL THERAPY TREATMENT    Patient Name: Dale Fitzgerald MRN: [id number] DOB:23-Jun-1985, 38 y.o., male Today's Date: 10/07/2023  END OF SESSION:  PT End of Session - 10/07/23 1432     Visit Number 10    Number of Visits 20    Date for PT Re-Evaluation 11/03/23    Authorization Type [employer] $40 copay    Progress Note Due on Visit 10    PT Start Time 1350    PT Stop Time 1440    PT Time Calculation (min) 50 min    Activity Tolerance Patient limited by pain;Patient tolerated treatment well;No increased pain;Patient limited by fatigue    Behavior During Therapy [facility] for tasks assessed/performed           Past Medical History:  Diagnosis Date   Anxiety    Hypertension    Past Surgical History:  Procedure Laterality Date   APPENDECTOMY     Patient Active Problem List   Diagnosis Date Noted   Patellar tendon rupture, right, initial encounter 07/12/2023   Depression     PCP: No provider listed in epic.   REFERRING PROVIDER: Jule Ronal LITTIE Fitzgerald  REFERRING DIAG: [phone] (ICD-10-CM) - Patellar tendon rupture, right, initial encounter  THERAPY DIAG:  Acute pain of right knee  Muscle weakness (generalized)  Stiffness of right knee, not elsewhere classified  Difficulty in walking, not elsewhere classified  Localized edema  Rationale for Evaluation and Treatment: Rehabilitation  ONSET DATE: Surgery 07/21/2023  SUBJECTIVE:   SUBJECTIVE STATEMENT:  Dale Fitzgerald notes increased pain the past 2 days at his surgical site.  This may be due to increased walking and weight-bearing and we discussed taking frequent breaks, using ice and increasing quadriceps strengthening while still avoiding overuse.  PERTINENT HISTORY: Right patella tendon rupture 07/11/2023 with surgery 07/21/2023. Had been wearing hinge brace 0-30 degrees prior to 08/23/2023 PA visit.  Plan for follow up in 4 weeks for brace unlocking to 90 deg.  Currently set from 0-60.    PAIN:  NPRS scale: 2-4/10  this week Pain location: Rt knee  Pain description: shock, tight Aggravating factors: end ranges, WB pressure Relieving factors: has used heat but not   PRECAUTIONS: Knee - No in house protocol directly provided.  Respected protocol from [facility] in folder bin in clinic.  10/05/23: per Doctors orders, okay for out of brace PROM as tolerated   WEIGHT BEARING RESTRICTIONS: Brace 0-90 per PA visit on 09/20/2023   FALLS:  Has patient fallen in last 6 months? Yes. Number of falls 1 - current injury  LIVING ENVIRONMENT: Lives in: House/apartment Stairs: Flight of stairs in house.    Outside small step for porch without rail Has following equipment at home: Bilateral crutches, locking stairs.   OCCUPATION: Has worked 3 jobs. 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   PLOF: Independent, basketball recreationally, playing sports with son.    PATIENT GOALS: Reduce pain, get back to work/life. Get stronger  OBJECTIVE:   PATIENT SURVEYS:  Patient-Specific Activity Scoring Scheme  0 represents "unable to perform." 10 represents "able to perform at prior level. 0 1 2 3 4 5 6 7 8 9  10 (Date and Score)   Activity Eval  08/25/2023    1. Walking independently  0    2. Stairs  4    3. work 0   4.Recreational sport 0   5.    Score 1  avg    Total score = sum of the activity scores/number of activities Minimum detectable change (90%CI) for average score = 2 points Minimum detectable change (90%CI) for single activity score = 3 points  COGNITION: 08/25/2023 Overall cognitive status: WFL    SENSATION: 08/25/2023 WFL  EDEMA:  08/25/2023 Localized edema Rt knee  MUSCLE LENGTH: 08/25/2023 No specific testing.   POSTURE:  08/25/2023 Unremarkable   PALPATION: 08/25/2023 Mild tenderness around incision.   LOWER EXTREMITY ROM:   ROM Right Eval 08/25/2023 Left Eval 08/25/2023 Right 09/15/2023 Right  10/05/2023 Right 7/?/2025  Knee flexion 60  AROM in supine heel slide with pain   P: 92 in supine with pain    Knee extension -16 AROM in seated LAQ  -9 in supine heel prop  -5 in seated LAQ AROM Rt -4 in supine with heel prop    (Blank rows = not tested)  LOWER EXTREMITY MMT:  MMT Right Eval 08/25/2023 Left Eval 08/25/2023 Right 09/19/2023  Hip flexion 3+/5 5/5 5/5  Hip extension     Hip abduction     Hip adduction     Hip internal rotation     Hip external rotation     Knee flexion 4/5 5/5 4/5  Knee extension NT 5/5 3+/5  Ankle dorsiflexion 5/5  5/5  Ankle plantarflexion     Ankle inversion     Ankle eversion      (Blank rows = not tested)  LOWER EXTREMITY SPECIAL TESTS:  08/25/2023 No specific testing.   FUNCTIONAL TESTS:  08/25/2023 18 inch chair transfer: Unable unassist  Lt SLS: not tested Rt SLS: unable   GAIT: 09/22/2023: Single axillary crutch, brace 0-90 deg.   09/15/2023: One axillary crutch with brace 0-60 deg.   08/25/2023 Hinge brace 0-60 degrees with bilateral axillary crutches.  Step to pattern primarily with partial WB noted on Rt leg. Associated deviations due to brace locking at 60 deg (hip circumduction, hike etc)  BFR Borderline cuff 4 or 5.  Selected 5 for assessment. LOP in supine 216 mmHg                                                                                                                                                                       TODAY'S TREATMENT:      DATE: 10/07/2023 Seated straight leg raises (was able to do, but pain-limited at patellar tendon) 5 x 3 seconds Seated quadriceps sets 5 x 5 seconds (discussed doing 100 per day between supine and seated)  Functional Activities: Leg Press Single Leg 25# 2 sets of 20 with full extension and slow eccentrics Step down off 2 inch step with upper extremity support 10 x slow eccentrics  Neuromuscular re-education: Tandem balance 4 x  20 seconds  Tandem walk on foam, hands PRN 4 laps forward and back  Vaso Right Knee Medium  34* 10 minutes   TODAY'S TREATMENT:      DATE: 10/05/2023 Therex: Seated Rt LAQ, 2x10 Leg press with knees at 90*, BL 50# x15, 62# x 15  Attempted SLR and SAQ but too painful. PROM knee flexion/extension  Neuro Re-ed: steps to cones (ant-lat, lat & post-lat), 5 rounds each leg leading;  with KO but no AD.  Sink & chair backs for support as needed with intermittent touch.  He had shorter steps leading with RLE.   Self-Care:  Ice massage around scar for 5 minutes.  PT instructed and demonstrated for home.  Patient verbalized understanding.   TREATMENT:      DATE: 09/19/2023 Therex: Seated Rt leg LAQ 2 x 12 Seated quad set with SLR Rt leg Time spent in review of protocol with knee brace adjustment.  Encouraged addition of SLR for at home use pending symptoms.  Cues given for exercise routine.  Sit to stand to sit with brace on 0-90 deg x 10 - 26 inches.    Neuro Re-ed (muscle activation, neural recruitment, balance improvements) Seated Rt knee isometric flexion/extension in 80 deg, 50 deg 5 sec alternating x 12 each Seated Rt leg quad set 5 sec hold x 10    Vaso 10 mins Rt knee in elevation 34 deg medium compression for swelling.   Education on HEP given during use.   PATIENT EDUCATION:  09/22/2023 Education details: HEP update Person educated: Patient Education method: Programmer, multimedia, Demonstration, Verbal cues, and Handouts Education comprehension: verbalized understanding, returned demonstration, and verbal cues required  HOME EXERCISE PROGRAM: Access Code: [email or web address] URL: [email or web address] Date: 09/22/2023 Prepared by: Ozell Silvan  Exercises - Supine Heel Slide  - 3-5 x daily - 7 x weekly - 1 sets - 10 reps - 5 hold - Seated Long Arc Quad  - 3-5 x daily - 7 x weekly - 1 sets - 5-10 reps - 2 hold - Supine Knee Extension Mobilization with Weight  - 4-5 x daily - 7 x weekly - 1 sets - 1 reps - to tolerance up to 5 mins hold - Supine Quadricep Sets  - 3-5 x  daily - 7 x weekly - 1 sets - 10 reps - 5 hold - Seated Quad Set  - 3-5 x daily - 7 x weekly - 1 sets - 10 reps - 5 hold - Seated Straight Leg Heel Taps  - 1-2 x daily - 7 x weekly - 3 sets - 10 reps   ASSESSMENT:  CLINICAL IMPRESSION:  Deonte has significant quadriceps atrophy and weakness that will need to improve for return to his normal full-time work duties.  I encouraged him to try to get at least 100 quadriceps sets per day along with his normal home exercises.  I also encouraged Soma to continue his supervised physical therapy up to the end of his current plan of care 11/03/2023 given his current pain, weakness and level of function.  OBJECTIVE IMPAIRMENTS: Abnormal gait, decreased activity tolerance, decreased balance, decreased coordination, decreased endurance, decreased mobility, difficulty walking, decreased ROM, decreased strength, hypomobility, increased edema, increased fascial restrictions, impaired perceived functional ability, increased muscle spasms, impaired flexibility, improper body mechanics, and pain.   ACTIVITY LIMITATIONS: carrying, lifting, bending, sitting, standing, squatting, sleeping, stairs, transfers, bed mobility, and locomotion level  PARTICIPATION LIMITATIONS: meal prep, cleaning, laundry, interpersonal relationship, driving, shopping, community activity, occupation, and yard work  PERSONAL FACTORS: Anxiety, HTN are also affecting patient's functional outcome.   REHAB POTENTIAL: Good  CLINICAL DECISION MAKING: Stable/uncomplicated  EVALUATION COMPLEXITY: Low   GOALS: Goals reviewed with patient? Yes  SHORT TERM GOALS: (target date for Short term goals are 3 weeks 09/15/2023)   1.  Patient will demonstrate independent use of home exercise program to maintain progress from in clinic treatments.  Goal status: Met 10/07/2023  LONG TERM GOALS: (target dates for all long term goals are 10 weeks  11/03/2023 )   1. Patient will demonstrate/report pain at  worst less than or equal to 2/10 to facilitate minimal limitation in daily activity secondary to pain symptoms.  Goal status: Ongoing   10/07/2023   2. Patient will demonstrate independent use of home exercise program to facilitate ability to maintain/progress functional gains from skilled physical therapy services.  Goal status: Ongoing   718/2025   3. Patient will demonstrate Patient specific functional scale avg > or = 8/10 to indicate reduced disability due to condition.   Goal status: Ongoing   09/06/2023   4.  Patient will demonstrate Rt LE MMT 5/5, dynamometry within 15 % of Lt for knee extension to faciltiate usual transfers, stairs, squatting at PLOF for daily life.   (No testing on eval for dynamometry due to surgical protocol)  Goal status: Ongoing   10/07/2023   5.  Patient will demonstrate independent ambulation > 500 ft for community integration at [facility].  Goal status:   Ongoing   10/07/2023   6.  Patient will demonstrate ascending/descending stairs reciprocally s UE assist for community integration.   Goal status: Ongoing   10/07/2023   7.  Patient will demonstrate SLS bilateral > 30 seconds for stability in ambualtion.  Goal Status: Ongoing   10/07/2023   PLAN:  PT FREQUENCY: 2x/week  PT DURATION: 10 weeks  PLANNED INTERVENTIONS: Can include 02853- PT Re-evaluation, 97110-Therapeutic exercises, 97530- Therapeutic activity, 97112- Neuromuscular re-education, 97535- Self Care, 97140- Manual therapy, [phone]- Gait training, [phone]- Orthotic Fit/training, [phone]- Canalith repositioning, [id number]- Aquatic Therapy, [phone]- Electrical stimulation (unattended), [phone]- Electrical stimulation (manual), [id number] Physical performance testing, 97016- Vasopneumatic device, [id number]- Ultrasound, [id number]- Traction (mechanical), [id number]- Ionotophoresis 4mg /ml Dexamethasone , Patient/Family education, Balance training, Stair training, Taping, Dry Needling, Joint mobilization, Joint manipulation, Spinal  manipulation, Spinal mobilization, Scar mobilization, Vestibular training, Visual/preceptual remediation/compensation, DME instructions, Cryotherapy, and Moist heat.  All performed as medically necessary.  All included unless contraindicated  PLAN FOR NEXT SESSION: CHECK STG, check if tried ice massage.   Continue appropriate WB strengthening within brace, watching pain response. Check in with pain during SLR and SAQ. Check gait and address buckling when walking, special attention to provide cueing to avoid hyperextension when his quadriceps fatigue.   NO aggressive CKC with weight at this time, no weight loaded OKC extension per protocol recommendations.   Hinge brace 0-90 degrees from PA visit on 09/20/2023. No in house protocol directly provided.  Respected protocol from [facility] in folder bin in clinic.  7/16: PROM out of brace as tolerated     Myer LELON Ivory PT, MPT 10/07/23  2:43 PM

## 2023-10-10 ENCOUNTER — Encounter: Admitting: Rehabilitative and Restorative Service Providers"

## 2023-10-10 NOTE — Therapy (Incomplete)
 OUTPATIENT PHYSICAL THERAPY TREATMENT    Patient Name: Dale Fitzgerald MRN: [id number] DOB:18-May-1985, 38 y.o., male Today's Date: 10/10/2023  END OF SESSION:     Past Medical History:  Diagnosis Date   Anxiety    Hypertension    Past Surgical History:  Procedure Laterality Date   APPENDECTOMY     Patient Active Problem List   Diagnosis Date Noted   Patellar tendon rupture, right, initial encounter 07/12/2023   Depression     PCP: No provider listed in epic.   REFERRING PROVIDER: Jule Ronal LITTIE DEVONNA  REFERRING DIAG: [phone] (ICD-10-CM) - Patellar tendon rupture, right, initial encounter  THERAPY DIAG:  No diagnosis found.  Rationale for Evaluation and Treatment: Rehabilitation  ONSET DATE: Surgery 07/21/2023  SUBJECTIVE:   SUBJECTIVE STATEMENT:  Dale Fitzgerald notes increased pain the past 2 days at his surgical site.  This may be due to increased walking and weight-bearing and we discussed taking frequent breaks, using ice and increasing quadriceps strengthening while still avoiding overuse.  PERTINENT HISTORY: Right patella tendon rupture 07/11/2023 with surgery 07/21/2023. Had been wearing hinge brace 0-30 degrees prior to 08/23/2023 PA visit.  Plan for follow up in 4 weeks for brace unlocking to 90 deg.  Currently set from 0-60.    PAIN:  NPRS scale: 2-4/10 this week Pain location: Rt knee  Pain description: shock, tight Aggravating factors: end ranges, WB pressure Relieving factors: has used heat but not   PRECAUTIONS: Knee - No in house protocol directly provided.  Respected protocol from [facility] in folder bin in clinic.  10/05/23: per Doctors orders, okay for out of brace PROM as tolerated   WEIGHT BEARING RESTRICTIONS: Brace 0-90 per PA visit on 09/20/2023   FALLS:  Has patient fallen in last 6 months? Yes. Number of falls 1 - current injury  LIVING ENVIRONMENT: Lives in: House/apartment Stairs: Flight of stairs in house.    Outside small step for porch without  rail Has following equipment at home: Bilateral crutches, locking stairs.   OCCUPATION: Has worked 3 jobs. 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   PLOF: Independent, basketball recreationally, playing sports with son.    PATIENT GOALS: Reduce pain, get back to work/life. Get stronger  OBJECTIVE:   PATIENT SURVEYS:  Patient-Specific Activity Scoring Scheme  0 represents "unable to perform." 10 represents "able to perform at prior level. 0 1 2 3 4 5 6 7 8 9  10 (Date and Score)   Activity Eval  08/25/2023    1. Walking independently  0    2. Stairs  4    3. work 0   4.Recreational sport 0   5.    Score 1 avg    Total score = sum of the activity scores/number of activities Minimum detectable change (90%CI) for average score = 2 points Minimum detectable change (90%CI) for single activity score = 3 points  COGNITION: 08/25/2023 Overall cognitive status: WFL    SENSATION: 08/25/2023 WFL  EDEMA:  08/25/2023 Localized edema Rt knee  MUSCLE LENGTH: 08/25/2023 No specific testing.   POSTURE:  08/25/2023 Unremarkable   PALPATION: 08/25/2023 Mild tenderness around incision.   LOWER EXTREMITY ROM:   ROM Right Eval 08/25/2023 Left Eval 08/25/2023 Right 09/15/2023 Right  10/05/2023 Right   Knee flexion 60 AROM in supine heel slide with pain   P: 92 in supine with pain    Knee extension -16 AROM in seated LAQ  -  9 in supine heel prop  -5 in seated LAQ AROM Rt -4 in supine with heel prop    (Blank rows = not tested)  LOWER EXTREMITY MMT:  MMT Right Eval 08/25/2023 Left Eval 08/25/2023 Right 09/19/2023  Hip flexion 3+/5 5/5 5/5  Hip extension     Hip abduction     Hip adduction     Hip internal rotation     Hip external rotation     Knee flexion 4/5 5/5 4/5  Knee extension NT 5/5 3+/5  Ankle dorsiflexion 5/5  5/5  Ankle plantarflexion     Ankle inversion     Ankle eversion      (Blank rows = not tested)  LOWER  EXTREMITY SPECIAL TESTS:  08/25/2023 No specific testing.   FUNCTIONAL TESTS:  08/25/2023 18 inch chair transfer: Unable unassist  Lt SLS: not tested Rt SLS: unable   GAIT: 09/22/2023: Single axillary crutch, brace 0-90 deg.   09/15/2023: One axillary crutch with brace 0-60 deg.   08/25/2023 Hinge brace 0-60 degrees with bilateral axillary crutches.  Step to pattern primarily with partial WB noted on Rt leg. Associated deviations due to brace locking at 60 deg (hip circumduction, hike etc)  BFR Borderline cuff 4 or 5.  Selected 5 for assessment. LOP in supine 216 mmHg                                                                                                                                                                       TREATMENT:        DATE: 10/10/2023 Therex:      TODAY'S TREATMENT:      DATE: 10/07/2023 Seated straight leg raises (was able to do, but pain-limited at patellar tendon) 5 x 3 seconds Seated quadriceps sets 5 x 5 seconds (discussed doing 100 per day between supine and seated)  Functional Activities: Leg Press Single Leg 25# 2 sets of 20 with full extension and slow eccentrics Step down off 2 inch step with upper extremity support 10 x slow eccentrics  Neuromuscular re-education: Tandem balance 4 x 20 seconds  Tandem walk on foam, hands PRN 4 laps forward and back  Vaso Right Knee Medium 34* 10 minutes   TODAY'S TREATMENT:      DATE: 10/05/2023 Therex: Seated Rt LAQ, 2x10 Leg press with knees at 90*, BL 50# x15, 62# x 15  Attempted SLR and SAQ but too painful. PROM knee flexion/extension  Neuro Re-ed: steps to cones (ant-lat, lat & post-lat), 5 rounds each leg leading;  with KO but no AD.  Sink & chair backs for support as needed with intermittent touch.  He had shorter steps leading with RLE.   Self-Care:  Ice massage around scar for 5  minutes.  PT instructed and demonstrated for home.  Patient verbalized  understanding.   TREATMENT:      DATE: 09/19/2023 Therex: Seated Rt leg LAQ 2 x 12 Seated quad set with SLR Rt leg Time spent in review of protocol with knee brace adjustment.  Encouraged addition of SLR for at home use pending symptoms.  Cues given for exercise routine.  Sit to stand to sit with brace on 0-90 deg x 10 - 26 inches.    Neuro Re-ed (muscle activation, neural recruitment, balance improvements) Seated Rt knee isometric flexion/extension in 80 deg, 50 deg 5 sec alternating x 12 each Seated Rt leg quad set 5 sec hold x 10    Vaso 10 mins Rt knee in elevation 34 deg medium compression for swelling.   Education on HEP given during use.   PATIENT EDUCATION:  09/22/2023 Education details: HEP update Person educated: Patient Education method: Programmer, multimedia, Demonstration, Verbal cues, and Handouts Education comprehension: verbalized understanding, returned demonstration, and verbal cues required  HOME EXERCISE PROGRAM: Access Code: [email or web address] URL: [email or web address] Date: 09/22/2023 Prepared by: Ozell Silvan  Exercises - Supine Heel Slide  - 3-5 x daily - 7 x weekly - 1 sets - 10 reps - 5 hold - Seated Long Arc Quad  - 3-5 x daily - 7 x weekly - 1 sets - 5-10 reps - 2 hold - Supine Knee Extension Mobilization with Weight  - 4-5 x daily - 7 x weekly - 1 sets - 1 reps - to tolerance up to 5 mins hold - Supine Quadricep Sets  - 3-5 x daily - 7 x weekly - 1 sets - 10 reps - 5 hold - Seated Quad Set  - 3-5 x daily - 7 x weekly - 1 sets - 10 reps - 5 hold - Seated Straight Leg Heel Taps  - 1-2 x daily - 7 x weekly - 3 sets - 10 reps   ASSESSMENT:  CLINICAL IMPRESSION:  Dale Fitzgerald has significant quadriceps atrophy and weakness that will need to improve for return to his normal full-time work duties.  I encouraged him to try to get at least 100 quadriceps sets per day along with his normal home exercises.  I also encouraged Dale Fitzgerald to continue his supervised physical  therapy up to the end of his current plan of care 11/03/2023 given his current pain, weakness and level of function.  OBJECTIVE IMPAIRMENTS: Abnormal gait, decreased activity tolerance, decreased balance, decreased coordination, decreased endurance, decreased mobility, difficulty walking, decreased ROM, decreased strength, hypomobility, increased edema, increased fascial restrictions, impaired perceived functional ability, increased muscle spasms, impaired flexibility, improper body mechanics, and pain.   ACTIVITY LIMITATIONS: carrying, lifting, bending, sitting, standing, squatting, sleeping, stairs, transfers, bed mobility, and locomotion level  PARTICIPATION LIMITATIONS: meal prep, cleaning, laundry, interpersonal relationship, driving, shopping, community activity, occupation, and yard work  PERSONAL FACTORS: Anxiety, HTN are also affecting patient's functional outcome.   REHAB POTENTIAL: Good  CLINICAL DECISION MAKING: Stable/uncomplicated  EVALUATION COMPLEXITY: Low   GOALS: Goals reviewed with patient? Yes  SHORT TERM GOALS: (target date for Short term goals are 3 weeks 09/15/2023)   1.  Patient will demonstrate independent use of home exercise program to maintain progress from in clinic treatments.  Goal status: Met 10/07/2023  LONG TERM GOALS: (target dates for all long term goals are 10 weeks  11/03/2023 )   1. Patient will demonstrate/report pain at worst less than or equal to 2/10 to facilitate minimal limitation in daily activity secondary to  pain symptoms.  Goal status: Ongoing   10/07/2023   2. Patient will demonstrate independent use of home exercise program to facilitate ability to maintain/progress functional gains from skilled physical therapy services.  Goal status: Ongoing   718/2025   3. Patient will demonstrate Patient specific functional scale avg > or = 8/10 to indicate reduced disability due to condition.   Goal status: Ongoing   09/06/2023   4.  Patient  will demonstrate Rt LE MMT 5/5, dynamometry within 15 % of Lt for knee extension to faciltiate usual transfers, stairs, squatting at PLOF for daily life.   (No testing on eval for dynamometry due to surgical protocol)  Goal status: Ongoing   10/07/2023   5.  Patient will demonstrate independent ambulation > 500 ft for community integration at [facility].  Goal status:   Ongoing   10/07/2023   6.  Patient will demonstrate ascending/descending stairs reciprocally s UE assist for community integration.   Goal status: Ongoing   10/07/2023   7.  Patient will demonstrate SLS bilateral > 30 seconds for stability in ambualtion.  Goal Status: Ongoing   10/07/2023   PLAN:  PT FREQUENCY: 2x/week  PT DURATION: 10 weeks  PLANNED INTERVENTIONS: Can include 02853- PT Re-evaluation, 97110-Therapeutic exercises, 97530- Therapeutic activity, 97112- Neuromuscular re-education, 97535- Self Care, 97140- Manual therapy, [phone]- Gait training, [id number]- Orthotic Fit/training, [phone]- Canalith repositioning, [id number]- Aquatic Therapy, [phone]- Electrical stimulation (unattended), [phone]- Electrical stimulation (manual), [id number] Physical performance testing, 97016- Vasopneumatic device, [id number]- Ultrasound, [id number]- Traction (mechanical), [id number]- Ionotophoresis 4mg /ml Dexamethasone , Patient/Family education, Balance training, Stair training, Taping, Dry Needling, Joint mobilization, Joint manipulation, Spinal manipulation, Spinal mobilization, Scar mobilization, Vestibular training, Visual/preceptual remediation/compensation, DME instructions, Cryotherapy, and Moist heat.  All performed as medically necessary.  All included unless contraindicated  PLAN FOR NEXT SESSION:  ***   NO aggressive CKC with weight at this time, no weight loaded OKC extension per protocol recommendations.   Hinge brace 0-90 degrees from PA visit on 09/20/2023. No in house protocol directly provided.  Respected protocol from [facility] in folder bin in clinic.  7/16: PROM  out of brace as tolerated     Ozell Silvan, PT, DPT, OCS, ATC 10/10/23  8:21 AM

## 2023-10-12 ENCOUNTER — Ambulatory Visit: Admitting: Rehabilitative and Restorative Service Providers"

## 2023-10-12 ENCOUNTER — Encounter: Payer: Self-pay | Admitting: Rehabilitative and Restorative Service Providers"

## 2023-10-12 DIAGNOSIS — R262 Difficulty in walking, not elsewhere classified: Secondary | ICD-10-CM | POA: Diagnosis not present

## 2023-10-12 DIAGNOSIS — M25561 Pain in right knee: Secondary | ICD-10-CM

## 2023-10-12 DIAGNOSIS — M25661 Stiffness of right knee, not elsewhere classified: Secondary | ICD-10-CM | POA: Diagnosis not present

## 2023-10-12 DIAGNOSIS — M6281 Muscle weakness (generalized): Secondary | ICD-10-CM | POA: Diagnosis not present

## 2023-10-12 DIAGNOSIS — R600 Localized edema: Secondary | ICD-10-CM

## 2023-10-12 NOTE — Therapy (Signed)
 OUTPATIENT PHYSICAL THERAPY TREATMENT    Patient Name: Dale Fitzgerald MRN: [id number] DOB:11/05/1985, 38 y.o., male Today's Date: 10/12/2023  END OF SESSION:  PT End of Session - 10/12/23 1454     Visit Number 11    Number of Visits 20    Date for PT Re-Evaluation 11/03/23    Authorization Type [employer] $40 copay    PT Start Time 1431    PT Stop Time 1520    PT Time Calculation (min) 49 min    Activity Tolerance Patient limited by pain    Behavior During Therapy [facility] for tasks assessed/performed            Past Medical History:  Diagnosis Date   Anxiety    Hypertension    Past Surgical History:  Procedure Laterality Date   APPENDECTOMY     Patient Active Problem List   Diagnosis Date Noted   Patellar tendon rupture, right, initial encounter 07/12/2023   Depression     PCP: No provider listed in epic.   REFERRING PROVIDER: Jule Ronal LITTIE DEVONNA  REFERRING DIAG: [phone] (ICD-10-CM) - Patellar tendon rupture, right, initial encounter  THERAPY DIAG:  Acute pain of right knee  Muscle weakness (generalized)  Stiffness of right knee, not elsewhere classified  Difficulty in walking, not elsewhere classified  Localized edema  Rationale for Evaluation and Treatment: Rehabilitation  ONSET DATE: Surgery 07/21/2023  SUBJECTIVE:   SUBJECTIVE STATEMENT:   Pt indicated last appointment was missed due to lack of transportation.  Pt indicated continued complaints in front of knee with bending.  Reported doing alright  PERTINENT HISTORY: Right patella tendon rupture 07/11/2023 with surgery 07/21/2023. Had been wearing hinge brace 0-30 degrees prior to 08/23/2023 PA visit.  Plan for follow up in 4 weeks for brace unlocking to 90 deg.  Currently set from 0-60.    PAIN:  NPRS scale: 2-4/10 this week Pain location: Rt knee  Pain description: shock, tight Aggravating factors: end ranges, WB pressure Relieving factors: has used heat but not   PRECAUTIONS: Knee - No in house  protocol directly provided.  Respected protocol from [facility] in folder bin in clinic.  10/05/23: per Doctors orders, okay for out of brace PROM as tolerated   WEIGHT BEARING RESTRICTIONS: Brace 0-90 per PA visit on 09/20/2023   FALLS:  Has patient fallen in last 6 months? Yes. Number of falls 1 - current injury  LIVING ENVIRONMENT: Lives in: House/apartment Stairs: Flight of stairs in house.    Outside small step for porch without rail Has following equipment at home: Bilateral crutches, locking stairs.   OCCUPATION: Has worked 3 jobs. 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   PLOF: Independent, basketball recreationally, playing sports with son.    PATIENT GOALS: Reduce pain, get back to work/life. Get stronger  OBJECTIVE:   PATIENT SURVEYS:  Patient-Specific Activity Scoring Scheme  0 represents "unable to perform." 10 represents "able to perform at prior level. 0 1 2 3 4 5 6 7 8 9  10 (Date and Score)   Activity Eval  08/25/2023    1. Walking independently  0    2. Stairs  4    3. work 0   4.Recreational sport 0   5.    Score 1 avg    Total score = sum of the activity scores/number of activities Minimum detectable change (90%CI) for average score = 2 points Minimum detectable change (  90%CI) for single activity score = 3 points  COGNITION: 08/25/2023 Overall cognitive status: WFL    SENSATION: 08/25/2023 WFL  EDEMA:  08/25/2023 Localized edema Rt knee  MUSCLE LENGTH: 08/25/2023 No specific testing.   POSTURE:  08/25/2023 Unremarkable   PALPATION: 08/25/2023 Mild tenderness around incision.   LOWER EXTREMITY ROM:   ROM Right Eval 08/25/2023 Left Eval 08/25/2023 Right 09/15/2023 Right  10/05/2023 Right 10/12/2023   Knee flexion 60 AROM in supine heel slide with pain   P: 92 in supine with pain    Knee extension -16 AROM in seated LAQ  -9 in supine heel prop  -5 in seated LAQ AROM Rt -4 in supine with heel prop 0  in heel prop   (Blank rows = not tested)  LOWER EXTREMITY MMT:  MMT Right Eval 08/25/2023 Left Eval 08/25/2023 Right 09/19/2023  Hip flexion 3+/5 5/5 5/5  Hip extension     Hip abduction     Hip adduction     Hip internal rotation     Hip external rotation     Knee flexion 4/5 5/5 4/5  Knee extension NT 5/5 3+/5  Ankle dorsiflexion 5/5  5/5  Ankle plantarflexion     Ankle inversion     Ankle eversion      (Blank rows = not tested)  LOWER EXTREMITY SPECIAL TESTS:  08/25/2023 No specific testing.   FUNCTIONAL TESTS:  08/25/2023 18 inch chair transfer: Unable unassist  Lt SLS: not tested Rt SLS: unable   GAIT: 09/22/2023: Single axillary crutch, brace 0-90 deg.   09/15/2023: One axillary crutch with brace 0-60 deg.   08/25/2023 Hinge brace 0-60 degrees with bilateral axillary crutches.  Step to pattern primarily with partial WB noted on Rt leg. Associated deviations due to brace locking at 60 deg (hip circumduction, hike etc)  BFR Borderline cuff 4 or 5.  Selected 5 for assessment. LOP in supine 216 mmHg                                                                                                                                                                       TREATMENT:        DATE: 10/12/2023 Therex: Recumbent bike partial circles with seat all the way back 6 mins Leg press double leg in brace 0-90 deg 75 lbs, single leg 31 lbs 2 x 15 Seated quad set with SLR Rt x 3 in sitting.  - stopped due to pain symptoms.    Neuro Re-ed SLS with contralateral leg clearing over hurdle 6 inch x 10 bilateral in // bars Step over and back 4 inch with forward and retro weight shift in // bars x 10 bilateral  Seated quad set 5 sec hold x 10  Rt leg Seated alternating Rt knee extension/ flexion isometrics 5 sec holds x 12 (detailed use at home with opposite leg for extension)      TODAY'S TREATMENT:      DATE: 10/07/2023 Seated straight leg raises (was able to do, but  pain-limited at patellar tendon) 5 x 3 seconds Seated quadriceps sets 5 x 5 seconds (discussed doing 100 per day between supine and seated)  Functional Activities: Leg Press Single Leg 25# 2 sets of 20 with full extension and slow eccentrics Step down off 2 inch step with upper extremity support 10 x slow eccentrics  Neuromuscular re-education: Tandem balance 4 x 20 seconds  Tandem walk on foam, hands PRN 4 laps forward and back  Vaso Right Knee Medium 34* 10 minutes   TODAY'S TREATMENT:      DATE: 10/05/2023 Therex: Seated Rt LAQ, 2x10 Leg press with knees at 90*, BL 50# x15, 62# x 15  Attempted SLR and SAQ but too painful. PROM knee flexion/extension  Neuro Re-ed: steps to cones (ant-lat, lat & post-lat), 5 rounds each leg leading;  with KO but no AD.  Sink & chair backs for support as needed with intermittent touch.  He had shorter steps leading with RLE.   Self-Care:  Ice massage around scar for 5 minutes.  PT instructed and demonstrated for home.  Patient verbalized understanding.   PATIENT EDUCATION:  09/22/2023 Education details: HEP update Person educated: Patient Education method: Programmer, multimedia, Demonstration, Verbal cues, and Handouts Education comprehension: verbalized understanding, returned demonstration, and verbal cues required  HOME EXERCISE PROGRAM: Access Code: [email or web address] URL: [email or web address] Date: 09/22/2023 Prepared by: Ozell Silvan  Exercises - Supine Heel Slide  - 3-5 x daily - 7 x weekly - 1 sets - 10 reps - 5 hold - Seated Long Arc Quad  - 3-5 x daily - 7 x weekly - 1 sets - 5-10 reps - 2 hold - Supine Knee Extension Mobilization with Weight  - 4-5 x daily - 7 x weekly - 1 sets - 1 reps - to tolerance up to 5 mins hold - Supine Quadricep Sets  - 3-5 x daily - 7 x weekly - 1 sets - 10 reps - 5 hold - Seated Quad Set  - 3-5 x daily - 7 x weekly - 1 sets - 10 reps - 5 hold - Seated Straight Leg Heel Taps  - 1-2 x daily - 7 x weekly -  3 sets - 10 reps   ASSESSMENT:  CLINICAL IMPRESSION:   Complaints from anterior knee still noted in quad activation attempts as well as end range flexion progression. Improving stance in ambulation noted though.  Continued attempts at quad strengthening to improve WB acceptance.  Continue to adapt intervention to avoid pain worsening.    OBJECTIVE IMPAIRMENTS: Abnormal gait, decreased activity tolerance, decreased balance, decreased coordination, decreased endurance, decreased mobility, difficulty walking, decreased ROM, decreased strength, hypomobility, increased edema, increased fascial restrictions, impaired perceived functional ability, increased muscle spasms, impaired flexibility, improper body mechanics, and pain.   ACTIVITY LIMITATIONS: carrying, lifting, bending, sitting, standing, squatting, sleeping, stairs, transfers, bed mobility, and locomotion level  PARTICIPATION LIMITATIONS: meal prep, cleaning, laundry, interpersonal relationship, driving, shopping, community activity, occupation, and yard work  PERSONAL FACTORS: Anxiety, HTN are also affecting patient's functional outcome.   REHAB POTENTIAL: Good  CLINICAL DECISION MAKING: Stable/uncomplicated  EVALUATION COMPLEXITY: Low   GOALS: Goals reviewed with patient? Yes  SHORT TERM GOALS: (target date for Short term goals are 3 weeks 09/15/2023)  1.  Patient will demonstrate independent use of home exercise program to maintain progress from in clinic treatments.  Goal status: Met 10/07/2023  LONG TERM GOALS: (target dates for all long term goals are 10 weeks  11/03/2023 )   1. Patient will demonstrate/report pain at worst less than or equal to 2/10 to facilitate minimal limitation in daily activity secondary to pain symptoms.  Goal status: Ongoing   10/07/2023   2. Patient will demonstrate independent use of home exercise program to facilitate ability to maintain/progress functional gains from skilled physical therapy  services.  Goal status: Ongoing   718/2025   3. Patient will demonstrate Patient specific functional scale avg > or = 8/10 to indicate reduced disability due to condition.   Goal status: Ongoing   09/06/2023   4.  Patient will demonstrate Rt LE MMT 5/5, dynamometry within 15 % of Lt for knee extension to faciltiate usual transfers, stairs, squatting at PLOF for daily life.   (No testing on eval for dynamometry due to surgical protocol)  Goal status: Ongoing   10/07/2023   5.  Patient will demonstrate independent ambulation > 500 ft for community integration at [facility].  Goal status:   Ongoing   10/07/2023   6.  Patient will demonstrate ascending/descending stairs reciprocally s UE assist for community integration.   Goal status: Ongoing   10/07/2023   7.  Patient will demonstrate SLS bilateral > 30 seconds for stability in ambualtion.  Goal Status: Ongoing   10/07/2023   PLAN:  PT FREQUENCY: 2x/week  PT DURATION: 10 weeks  PLANNED INTERVENTIONS: Can include 02853- PT Re-evaluation, 97110-Therapeutic exercises, 97530- Therapeutic activity, 97112- Neuromuscular re-education, 97535- Self Care, 97140- Manual therapy, [phone]- Gait training, [phone]- Orthotic Fit/training, [phone]- Canalith repositioning, [id number]- Aquatic Therapy, [phone]- Electrical stimulation (unattended), [phone]- Electrical stimulation (manual), [id number] Physical performance testing, 97016- Vasopneumatic device, [id number]- Ultrasound, [id number]- Traction (mechanical), [id number]- Ionotophoresis 4mg /ml Dexamethasone , Patient/Family education, Balance training, Stair training, Taping, Dry Needling, Joint mobilization, Joint manipulation, Spinal manipulation, Spinal mobilization, Scar mobilization, Vestibular training, Visual/preceptual remediation/compensation, DME instructions, Cryotherapy, and Moist heat.  All performed as medically necessary.  All included unless contraindicated  PLAN FOR NEXT SESSION:  Quad activation as able.  Progressive  strengthening/balance improvements.      Hinge brace 0-90 degrees from PA visit on 09/20/2023. No in house protocol directly provided.  Respected protocol from [facility] in folder bin in clinic.  7/16: PROM out of brace as tolerated     Ozell Silvan, PT, DPT, OCS, ATC 10/12/23  3:11 PM

## 2023-10-18 ENCOUNTER — Encounter: Payer: Self-pay | Admitting: Orthopaedic Surgery

## 2023-10-18 ENCOUNTER — Ambulatory Visit (INDEPENDENT_AMBULATORY_CARE_PROVIDER_SITE_OTHER): Admitting: Orthopaedic Surgery

## 2023-10-18 DIAGNOSIS — S86811A Strain of other muscle(s) and tendon(s) at lower leg level, right leg, initial encounter: Secondary | ICD-10-CM

## 2023-10-18 NOTE — Progress Notes (Signed)
   Post-Op Visit Note   Patient: Dale Fitzgerald           Date of Birth: May 02, 1985           MRN: [id number] Visit Date: 10/18/2023 PCP: Patient, No Pcp Per   Assessment & Plan:  Chief Complaint:  Chief Complaint  Patient presents with   Right Knee - Follow-up    Right patellar tendon repair 07/21/2023   Visit Diagnoses:  1. Patellar tendon rupture, right, initial encounter     Plan: History of Present Illness Dale Fitzgerald is a 38 year old male who presents for a follow-up of his knee rehabilitation post-injury.  Twelve weeks into recovery, he experiences persistent discomfort and pain, especially when walking without crutches, leading to occasional knee buckling. Aching occurs at night while lying in bed. Physical therapy exercises involving hamstring engagement cause significant anterior knee pain. Walking sometimes results in discomfort in the same area. 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  Physical Exam MUSCULOSKELETAL: Knee alignment normal  Assessment and Plan Status post right knee injury and surgical repair 12 weeks post-surgery with ongoing pain and functional limitation. Improvement noted but discomfort persists, especially during ambulation and certain exercises. Occasional buckling and nocturnal aching. Flexibility and range of motion improving. Strength recovery expected to take at least nine months. Psychological impact acknowledged. - Discontinue knee brace and crutches unless needed for comfort. - Extend work leave for four weeks for time to improve quad strength and decrease buckling sensation. - Encourage checking with HR for light duty options. - Schedule follow-up in two months. - Provide work note for disability documentation.  Follow-Up Instructions: Return in about 2 months (around 12/19/2023).   Orders:  No orders of  the defined types were placed in this encounter.  No orders of the defined types were placed in this encounter.   Imaging: No results found.  PMFS History: Patient Active Problem List   Diagnosis Date Noted   Patellar tendon rupture, right, initial encounter 07/12/2023   Depression    Past Medical History:  Diagnosis Date   Anxiety    Hypertension     No family history on file.  Past Surgical History:  Procedure Laterality Date   APPENDECTOMY     Social History   Occupational History   Not on file  Tobacco Use   Smoking status: Every Day    Current packs/day: 0.50    Average packs/day: 0.5 packs/day for 10.0 years (5.0 ttl pk-yrs)    Types: Cigarettes   Smokeless tobacco: Not on file  Vaping Use   Vaping status: Never Used  Substance and Sexual Activity   Alcohol use: Yes   Drug use: Yes    Types: Marijuana   Sexual activity: Not on file

## 2023-10-20 ENCOUNTER — Ambulatory Visit (INDEPENDENT_AMBULATORY_CARE_PROVIDER_SITE_OTHER): Admitting: Physical Therapy

## 2023-10-20 ENCOUNTER — Encounter: Payer: Self-pay | Admitting: Physical Therapy

## 2023-10-20 DIAGNOSIS — M25561 Pain in right knee: Secondary | ICD-10-CM | POA: Diagnosis not present

## 2023-10-20 DIAGNOSIS — M6281 Muscle weakness (generalized): Secondary | ICD-10-CM

## 2023-10-20 DIAGNOSIS — R262 Difficulty in walking, not elsewhere classified: Secondary | ICD-10-CM | POA: Diagnosis not present

## 2023-10-20 DIAGNOSIS — M25661 Stiffness of right knee, not elsewhere classified: Secondary | ICD-10-CM | POA: Diagnosis not present

## 2023-10-20 DIAGNOSIS — R600 Localized edema: Secondary | ICD-10-CM

## 2023-10-20 NOTE — Therapy (Addendum)
 OUTPATIENT PHYSICAL THERAPY TREATMENT    Patient Name: Dale Fitzgerald MRN: [id number] DOB:Aug 04, 1985, 38 y.o., male Today's Date: 10/20/2023  END OF SESSION:  PT End of Session - 10/20/23 1101     Visit Number 12    Number of Visits 20    Date for PT Re-Evaluation 11/03/23    Authorization Type [employer] $40 copay    PT Start Time 1100    PT Stop Time 1143    PT Time Calculation (min) 43 min    Activity Tolerance Patient limited by pain;Patient tolerated treatment well    Behavior During Therapy [facility] for tasks assessed/performed             Past Medical History:  Diagnosis Date   Anxiety    Hypertension    Past Surgical History:  Procedure Laterality Date   APPENDECTOMY     Patient Active Problem List   Diagnosis Date Noted   Patellar tendon rupture, right, initial encounter 07/12/2023   Depression     PCP: No provider listed in epic.   REFERRING PROVIDER: Jule Ronal LITTIE DEVONNA  REFERRING DIAG: [phone] (ICD-10-CM) - Patellar tendon rupture, right, initial encounter  THERAPY DIAG:  Acute pain of right knee  Muscle weakness (generalized)  Stiffness of right knee, not elsewhere classified  Difficulty in walking, not elsewhere classified  Localized edema  Rationale for Evaluation and Treatment: Rehabilitation  ONSET DATE: Surgery 07/21/2023  SUBJECTIVE:   SUBJECTIVE STATEMENT: Patient saw [name] on 7/29 and he recommended patient discontinue use of the brace and crutches.  Patient reports discontinuation of brace and crutches on Tuesday.  He has been going to the gym since the bike and treadmill for walking.  Patient reports that he has had various moments where his knee has buckled but he has not fallen but came close 1 time.  Patient expresses concerns about going back to work because it requires long amounts of bending and walking.  PERTINENT HISTORY: Right patella tendon rupture 07/11/2023 with surgery 07/21/2023. Had been wearing hinge brace 0-30 degrees  prior to 08/23/2023 PA visit.  Plan for follow up in 4 weeks for brace unlocking to 90 deg.  Currently set from 0-60.    PAIN:  NPRS scale: 2-4/10 this week Pain location: Rt knee  Pain description: shock, tight Aggravating factors: end ranges, WB pressure Relieving factors: has used heat but not   PRECAUTIONS: Knee - No in house protocol directly provided.  Respected protocol from [facility] in folder bin in clinic.  10/05/23: per Doctors orders, okay for out of brace PROM as tolerated   WEIGHT BEARING RESTRICTIONS: Brace 0-90 per PA visit on 09/20/2023   FALLS:  Has patient fallen in last 6 months? Yes. Number of falls 1 - current injury  LIVING ENVIRONMENT: Lives in: House/apartment Stairs: Flight of stairs in house.    Outside small step for porch without rail Has following equipment at home: Bilateral crutches, locking stairs.   OCCUPATION: Has worked 3 jobs. 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   PLOF: Independent, basketball recreationally, playing sports with son.    PATIENT GOALS: Reduce pain, get back to work/life. Get stronger  OBJECTIVE:   PATIENT SURVEYS:  Patient-Specific Activity Scoring Scheme  0 represents "unable to perform." 10 represents "able to perform at prior level. 0 1 2 3 4 5 6 7 8 9  10 (Date and Score)   Activity Eval  08/25/2023  1. Walking independently  0    2. Stairs  4    3. work 0   4.Recreational sport 0   5.    Score 1 avg    Total score = sum of the activity scores/number of activities Minimum detectable change (90%CI) for average score = 2 points Minimum detectable change (90%CI) for single activity score = 3 points  COGNITION: 08/25/2023 Overall cognitive status: WFL    SENSATION: 08/25/2023 WFL  EDEMA:  08/25/2023 Localized edema Rt knee  MUSCLE LENGTH: 08/25/2023 No specific testing.   POSTURE:  08/25/2023 Unremarkable   PALPATION: 08/25/2023 Mild tenderness around  incision.   LOWER EXTREMITY ROM:   ROM Right Eval 08/25/2023 Left Eval 08/25/2023 Right 09/15/2023 Right  10/05/2023 Right 10/12/2023   Knee flexion 60 AROM in supine heel slide with pain   P: 92 in supine with pain    Knee extension -16 AROM in seated LAQ  -9 in supine heel prop  -5 in seated LAQ AROM Rt -4 in supine with heel prop 0 in heel prop   (Blank rows = not tested)  LOWER EXTREMITY MMT:  MMT Right Eval 08/25/2023 Left Eval 08/25/2023 Right 09/19/2023  Hip flexion 3+/5 5/5 5/5  Hip extension     Hip abduction     Hip adduction     Hip internal rotation     Hip external rotation     Knee flexion 4/5 5/5 4/5  Knee extension NT 5/5 3+/5  Ankle dorsiflexion 5/5  5/5  Ankle plantarflexion     Ankle inversion     Ankle eversion      (Blank rows = not tested)  LOWER EXTREMITY SPECIAL TESTS:  08/25/2023 No specific testing.   FUNCTIONAL TESTS:  08/25/2023 18 inch chair transfer: Unable unassist  Lt SLS: not tested Rt SLS: unable   GAIT: 09/22/2023: Single axillary crutch, brace 0-90 deg.   09/15/2023: One axillary crutch with brace 0-60 deg.   08/25/2023 Hinge brace 0-60 degrees with bilateral axillary crutches.  Step to pattern primarily with partial WB noted on Rt leg. Associated deviations due to brace locking at 60 deg (hip circumduction, hike etc)  BFR Borderline cuff 4 or 5.  Selected 5 for assessment. LOP in supine 216 mmHg                                                                                                                                                                       TODAY'S TREATMENT:        DATE: 10/20/2023 Therex:  Recumbent bike, full circle, lvl 5, seat 11, for 8 minutes; PT recommended using seated cardio equipment at the gym like the bike for warm up and cool down.  He can use  the treadmill as he reported using. Leg press BLEs, 87#, 15x RLE only, 42#, 10x. Patient reported increased pain. BLE concentric and  RLE only isometric  /eccentric, 62#, 2x10 RLE mini squat for terminal extension control, 50# 2x10 PT educated patient on using leg press at gym with the above for exercises.  Patient verbalized understanding.  Neuro Re-Ed: Braiding w/ UE regression: BUEs to LUE (contralateral) to RUE (ipsilateral increased weightbearing RLE).  PT cues on alternating knee extension and flexion to perform the activity safely. Tandem stance: First rep of each set LLE back, second rep of each set RLE back, first set on floor with eyes open, second set on floor with eyes closed, third set eyes open on foam.  4 set on foam with eyes closed: 30 seconds each position. Minimal sway noted in intermittent touch //bars.  PT cued for HEP to perform between sink and chair back for safety.  Patient verbalized understanding TKE with green resistance band, 1x10.  RUE support on chair back.  PT cued technique including upright posture. Progressed TKE with RLE SLS for 2 seconds, 1x10.  Patient had difficulty keeping the right knee in extension while in SLS.   Updated HEP with H0, demo & verbal cues. SPT verbally reviewed HO with patient. Patient verbalized understanding.  TREATMENT:        DATE: 10/12/2023 Therex: Recumbent bike partial circles with seat all the way back 6 mins Leg press double leg in brace 0-90 deg 75 lbs, single leg 31 lbs 2 x 15 Seated quad set with SLR Rt x 3 in sitting.  - stopped due to pain symptoms.    Neuro Re-ed SLS with contralateral leg clearing over hurdle 6 inch x 10 bilateral in // bars Step over and back 4 inch with forward and retro weight shift in // bars x 10 bilateral  Seated quad set 5 sec hold x 10 Rt leg Seated alternating Rt knee extension/ flexion isometrics 5 sec holds x 12 (detailed use at home with opposite leg for extension)      TREATMENT:      DATE: 10/07/2023 Seated straight leg raises (was able to do, but pain-limited at patellar tendon) 5 x 3 seconds Seated quadriceps sets 5 x 5 seconds  (discussed doing 100 per day between supine and seated)  Functional Activities: Leg Press Single Leg 25# 2 sets of 20 with full extension and slow eccentrics Step down off 2 inch step with upper extremity support 10 x slow eccentrics  Neuromuscular re-education: Tandem balance 4 x 20 seconds  Tandem walk on foam, hands PRN 4 laps forward and back  Vaso Right Knee Medium 34* 10 minutes   PATIENT EDUCATION:  09/22/2023 Education details: HEP update Person educated: Patient Education method: Programmer, multimedia, Demonstration, Verbal cues, and Handouts Education comprehension: verbalized understanding, returned demonstration, and verbal cues required  HOME EXERCISE PROGRAM: Access Code: [email or web address] URL: [email or web address] Date: 10/20/2023 Prepared by: Grayce Spatz   Exercises - Supine Heel Slide  - 3-5 x daily - 7 x weekly - 1 sets - 10 reps - 5 hold - Seated Long Arc Quad  - 3-5 x daily - 7 x weekly - 1 sets - 5-10 reps - 2 hold - Supine Knee Extension Mobilization with Weight  - 4-5 x daily - 7 x weekly - 1 sets - 1 reps - to tolerance up to 5 mins hold - Supine Quadricep Sets  - 3-5 x daily - 7 x weekly - 1 sets - 10 reps -  5 hold - Seated Quad Set  - 3-5 x daily - 7 x weekly - 1 sets - 10 reps - 5 hold - Seated Straight Leg Heel Taps  - 1-2 x daily - 7 x weekly - 3 sets - 10 reps - Standing Terminal Knee Extension with Resistance  - 1 x daily - 7 x weekly - 2-3 sets - 10 reps - 3-5 seconds hold - Braided Sidestepping  - 1 x daily - 7 x weekly - 4-5 sets - 5-7 reps - Tandem Stance  - 1 x daily - 7 x weekly - 4 sets - 2 reps - 30 seconds hold  ASSESSMENT:  CLINICAL IMPRESSION: PT progress HEP to include more functional activities in stance to improve knee control.  He appears to understand updated HEP including recommendations for gym.  Patient is doing better this session and responded better to treatment. He does well with closed chain quad strengthening in the leg press.  Additionally he had difficulty with Rt eccentric control of the quads but was successful with the exercises although it increased some pain. Patient did well with new balance and strengthening exercises introduced today.  Patient will continue to benefit from skilled physical therapy to address impairments.  OBJECTIVE IMPAIRMENTS: Abnormal gait, decreased activity tolerance, decreased balance, decreased coordination, decreased endurance, decreased mobility, difficulty walking, decreased ROM, decreased strength, hypomobility, increased edema, increased fascial restrictions, impaired perceived functional ability, increased muscle spasms, impaired flexibility, improper body mechanics, and pain.   ACTIVITY LIMITATIONS: carrying, lifting, bending, sitting, standing, squatting, sleeping, stairs, transfers, bed mobility, and locomotion level  PARTICIPATION LIMITATIONS: meal prep, cleaning, laundry, interpersonal relationship, driving, shopping, community activity, occupation, and yard work  PERSONAL FACTORS: Anxiety, HTN are also affecting patient's functional outcome.   REHAB POTENTIAL: Good  CLINICAL DECISION MAKING: Stable/uncomplicated  EVALUATION COMPLEXITY: Low   GOALS: Goals reviewed with patient? Yes  SHORT TERM GOALS: (target date for Short term goals are 3 weeks 09/15/2023)   1.  Patient will demonstrate independent use of home exercise program to maintain progress from in clinic treatments.  Goal status: Met 10/07/2023  LONG TERM GOALS: (target dates for all long term goals are 10 weeks  11/03/2023 )   1. Patient will demonstrate/report pain at worst less than or equal to 2/10 to facilitate minimal limitation in daily activity secondary to pain symptoms.  Goal status: Ongoing   10/07/2023   2. Patient will demonstrate independent use of home exercise program to facilitate ability to maintain/progress functional gains from skilled physical therapy services.  Goal status: Ongoing    718/2025   3. Patient will demonstrate Patient specific functional scale avg > or = 8/10 to indicate reduced disability due to condition.   Goal status: Ongoing   09/06/2023   4.  Patient will demonstrate Rt LE MMT 5/5, dynamometry within 15 % of Lt for knee extension to faciltiate usual transfers, stairs, squatting at PLOF for daily life.   (No testing on eval for dynamometry due to surgical protocol)  Goal status: Ongoing   10/07/2023   5.  Patient will demonstrate independent ambulation > 500 ft for community integration at [facility].  Goal status:   Ongoing   10/07/2023   6.  Patient will demonstrate ascending/descending stairs reciprocally s UE assist for community integration.   Goal status: Ongoing   10/07/2023   7.  Patient will demonstrate SLS bilateral > 30 seconds for stability in ambualtion.  Goal Status: Ongoing   10/07/2023   PLAN:  PT FREQUENCY: 2x/week  PT DURATION: 10 weeks  PLANNED INTERVENTIONS: Can include 02853- PT Re-evaluation, 97110-Therapeutic exercises, 97530- Therapeutic activity, 97112- Neuromuscular re-education, 97535- Self Care, 97140- Manual therapy, [phone]- Gait training, [phone]- Orthotic Fit/training, [phone]- Canalith repositioning, [id number]- Aquatic Therapy, [phone]- Electrical stimulation (unattended), [phone]- Electrical stimulation (manual), [id number] Physical performance testing, 97016- Vasopneumatic device, [id number]- Ultrasound, [id number]- Traction (mechanical), [id number]- Ionotophoresis 4mg /ml Dexamethasone , Patient/Family education, Balance training, Stair training, Taping, Dry Needling, Joint mobilization, Joint manipulation, Spinal manipulation, Spinal mobilization, Scar mobilization, Vestibular training, Visual/preceptual remediation/compensation, DME instructions, Cryotherapy, and Moist heat.  All performed as medically necessary.  All included unless contraindicated  PLAN FOR NEXT SESSION: Check updated HEP and continue to progress with more functional standing activities.   Check how patient is using treadmill at the gym.  Balance activities including single-leg stance, and quad activation strengthening exercises in standing  10/18/2023 KO / brace and crutches discontinued by [name].   Ismael Theophilus Stallion, SPT 10/20/23  3:25 PM  This entire session of physical therapy was performed under the direct supervision of PT signing evaluation /treatment. PT reviewed note and agrees.   Grayce Spatz, PT, DPT 10/20/2023, 5:01 PM

## 2023-10-25 ENCOUNTER — Encounter: Admitting: Rehabilitative and Restorative Service Providers"

## 2023-10-27 NOTE — Therapy (Signed)
 OUTPATIENT PHYSICAL THERAPY TREATMENT    Patient Name: Dale Fitzgerald MRN: [id number] DOB:02-08-1986, 38 y.o., male Today's Date: 10/28/2023  END OF SESSION:  PT End of Session - 10/28/23 1258     Visit Number 13    Number of Visits 20    Date for PT Re-Evaluation 11/03/23    Authorization Type [employer] $40 copay    PT Start Time 1301    PT Stop Time 1350    PT Time Calculation (min) 49 min    Activity Tolerance Patient limited by pain;Patient tolerated treatment well    Behavior During Therapy [facility] for tasks assessed/performed              Past Medical History:  Diagnosis Date   Anxiety    Hypertension    Past Surgical History:  Procedure Laterality Date   APPENDECTOMY     Patient Active Problem List   Diagnosis Date Noted   Patellar tendon rupture, right, initial encounter 07/12/2023   Depression     PCP: No provider listed in epic.   REFERRING PROVIDER: Jule Ronal LITTIE DEVONNA  REFERRING DIAG: [phone] (ICD-10-CM) - Patellar tendon rupture, right, initial encounter  THERAPY DIAG:  Acute pain of right knee  Muscle weakness (generalized)  Stiffness of right knee, not elsewhere classified  Difficulty in walking, not elsewhere classified  Localized edema  Rationale for Evaluation and Treatment: Rehabilitation  ONSET DATE: Surgery 07/21/2023  SUBJECTIVE:   SUBJECTIVE STATEMENT:  Patient noting a little discomfort this date rating it a 5/10. Patient continues to have moments of instability/knee buckling and endorses that it is random.  PERTINENT HISTORY: Right patella tendon rupture 07/11/2023 with surgery 07/21/2023. Had been wearing hinge brace 0-30 degrees prior to 08/23/2023 PA visit.  Plan for follow up in 4 weeks for brace unlocking to 90 deg.  Currently set from 0-60.    PAIN:  NPRS scale: 2-4/10 this week Pain location: Rt knee  Pain description: shock, tight Aggravating factors: end ranges, WB pressure Relieving factors: has used heat but not    PRECAUTIONS: Knee - No in house protocol directly provided.  Respected protocol from [facility] in folder bin in clinic.  10/05/23: per Doctors orders, okay for out of brace PROM as tolerated   WEIGHT BEARING RESTRICTIONS: Brace 0-90 per PA visit on 09/20/2023   FALLS:  Has patient fallen in last 6 months? Yes. Number of falls 1 - current injury  LIVING ENVIRONMENT: Lives in: House/apartment Stairs: Flight of stairs in house.    Outside small step for porch without rail Has following equipment at home: Bilateral crutches, locking stairs.   OCCUPATION: Has worked 3 jobs. 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   PLOF: Independent, basketball recreationally, playing sports with son.    PATIENT GOALS: Reduce pain, get back to work/life. Get stronger  OBJECTIVE:   PATIENT SURVEYS:  Patient-Specific Activity Scoring Scheme  0 represents "unable to perform." 10 represents "able to perform at prior level. 0 1 2 3 4 5 6 7 8 9  10 (Date and Score)   Activity Eval  08/25/2023    1. Walking independently  0    2. Stairs  4    3. work 0   4.Recreational sport 0   5.    Score 1 avg    Total score = sum of the activity scores/number of activities Minimum detectable change (90%CI) for average score = 2 points  Minimum detectable change (90%CI) for single activity score = 3 points  COGNITION: 08/25/2023 Overall cognitive status: WFL    SENSATION: 08/25/2023 WFL  EDEMA:  08/25/2023 Localized edema Rt knee  MUSCLE LENGTH: 08/25/2023 No specific testing.   POSTURE:  08/25/2023 Unremarkable   PALPATION: 08/25/2023 Mild tenderness around incision.   LOWER EXTREMITY ROM:   ROM Right Eval 08/25/2023 Left Eval 08/25/2023 Right 09/15/2023 Right  10/05/2023 Right 10/12/2023   Knee flexion 60 AROM in supine heel slide with pain   P: 92 in supine with pain    Knee extension -16 AROM in seated LAQ  -9 in supine heel prop  -5 in seated LAQ  AROM Rt -4 in supine with heel prop 0 in heel prop   (Blank rows = not tested)  LOWER EXTREMITY MMT:  MMT Right Eval 08/25/2023 Left Eval 08/25/2023 Right 09/19/2023  Hip flexion 3+/5 5/5 5/5  Hip extension     Hip abduction     Hip adduction     Hip internal rotation     Hip external rotation     Knee flexion 4/5 5/5 4/5  Knee extension NT 5/5 3+/5  Ankle dorsiflexion 5/5  5/5  Ankle plantarflexion     Ankle inversion     Ankle eversion      (Blank rows = not tested)  LOWER EXTREMITY SPECIAL TESTS:  08/25/2023 No specific testing.   FUNCTIONAL TESTS:  08/25/2023 18 inch chair transfer: Unable unassist  Lt SLS: not tested Rt SLS: unable   GAIT: 09/22/2023: Single axillary crutch, brace 0-90 deg.   09/15/2023: One axillary crutch with brace 0-60 deg.   08/25/2023 Hinge brace 0-60 degrees with bilateral axillary crutches.  Step to pattern primarily with partial WB noted on Rt leg. Associated deviations due to brace locking at 60 deg (hip circumduction, hike etc)  BFR Borderline cuff 4 or 5.  Selected 5 for assessment. LOP in supine 216 mmHg                                                                                                                                                                       TODAY'S TREATMENT:         10/28/2023 TherEx:  Recumbent bike seat 11, level 3 for 4 minutes, level 5 for 4 minutes  Leg press  Bilat LEs: 87# 1x15, 1x15 at a slower pace with focus on controlling knee extension  Rt LE: highly painful this date, discontinued after 2 reps Bilat  LE concentric with R LE only eccentric, 2x10 with verbal cues for decreasing ROM secondary to increased pain RDLs with 20# KB 1x10; discontinued secondary to pain   Neuro Re-Ed:  Tandem stance with reactive balance control while catching and tossing  ball in parallel bars 3x30s each leg in back  Intermittent UE use due to minimal lateral LOB  TKE with green TB in parallel bars ; 1x10 quickly, 2x5  slow and controlled with 3s hold  patient noting intermittent sensation of buckling   Vaso:  Rt LE on wedge for 10 minutes with medium compression at 34deg    DATE: 10/20/2023 Therex:  Recumbent bike, full circle, lvl 5, seat 11, for 8 minutes; PT recommended using seated cardio equipment at the gym like the bike for warm up and cool down.  He can use the treadmill as he reported using. Leg press BLEs, 87#, 15x RLE only, 42#, 10x. Patient reported increased pain. BLE concentric and  RLE only isometric /eccentric, 62#, 2x10 RLE mini squat for terminal extension control, 50# 2x10 PT educated patient on using leg press at gym with the above for exercises.  Patient verbalized understanding.  Neuro Re-Ed: Braiding w/ UE regression: BUEs to LUE (contralateral) to RUE (ipsilateral increased weightbearing RLE).  PT cues on alternating knee extension and flexion to perform the activity safely. Tandem stance: First rep of each set LLE back, second rep of each set RLE back, first set on floor with eyes open, second set on floor with eyes closed, third set eyes open on foam.  4 set on foam with eyes closed: 30 seconds each position. Minimal sway noted in intermittent touch //bars.  PT cued for HEP to perform between sink and chair back for safety.  Patient verbalized understanding TKE with green resistance band, 1x10.  RUE support on chair back.  PT cued technique including upright posture. Progressed TKE with RLE SLS for 2 seconds, 1x10.  Patient had difficulty keeping the right knee in extension while in SLS.   Updated HEP with H0, demo & verbal cues. SPT verbally reviewed HO with patient. Patient verbalized understanding.  TREATMENT:        DATE: 10/12/2023 Therex: Recumbent bike partial circles with seat all the way back 6 mins Leg press double leg in brace 0-90 deg 75 lbs, single leg 31 lbs 2 x 15 Seated quad set with SLR Rt x 3 in sitting.  - stopped due to pain symptoms.    Neuro Re-ed SLS  with contralateral leg clearing over hurdle 6 inch x 10 bilateral in // bars Step over and back 4 inch with forward and retro weight shift in // bars x 10 bilateral  Seated quad set 5 sec hold x 10 Rt leg Seated alternating Rt knee extension/ flexion isometrics 5 sec holds x 12 (detailed use at home with opposite leg for extension)      TREATMENT:      DATE: 10/07/2023 Seated straight leg raises (was able to do, but pain-limited at patellar tendon) 5 x 3 seconds Seated quadriceps sets 5 x 5 seconds (discussed doing 100 per day between supine and seated)  Functional Activities: Leg Press Single Leg 25# 2 sets of 20 with full extension and slow eccentrics Step down off 2 inch step with upper extremity support 10 x slow eccentrics  Neuromuscular re-education: Tandem balance 4 x 20 seconds  Tandem walk on foam, hands PRN 4 laps forward and back  Vaso Right Knee Medium 34* 10 minutes   PATIENT EDUCATION:  09/22/2023 Education details: HEP update Person educated: Patient Education method: Programmer, multimedia, Demonstration, Verbal cues, and Handouts Education comprehension: verbalized understanding, returned demonstration, and verbal cues required  HOME EXERCISE PROGRAM: Access Code: [email or web address] URL: [email or web address] Date: 10/20/2023 Prepared by:  Grayce Spatz   Exercises - Supine Heel Slide  - 3-5 x daily - 7 x weekly - 1 sets - 10 reps - 5 hold - Seated Long Arc Quad  - 3-5 x daily - 7 x weekly - 1 sets - 5-10 reps - 2 hold - Supine Knee Extension Mobilization with Weight  - 4-5 x daily - 7 x weekly - 1 sets - 1 reps - to tolerance up to 5 mins hold - Supine Quadricep Sets  - 3-5 x daily - 7 x weekly - 1 sets - 10 reps - 5 hold - Seated Quad Set  - 3-5 x daily - 7 x weekly - 1 sets - 10 reps - 5 hold - Seated Straight Leg Heel Taps  - 1-2 x daily - 7 x weekly - 3 sets - 10 reps - Standing Terminal Knee Extension with Resistance  - 1 x daily - 7 x weekly - 2-3 sets - 10  reps - 3-5 seconds hold - Braided Sidestepping  - 1 x daily - 7 x weekly - 4-5 sets - 5-7 reps - Tandem Stance  - 1 x daily - 7 x weekly - 4 sets - 2 reps - 30 seconds hold  ASSESSMENT:  CLINICAL IMPRESSION:  Patient arrived to session noting increased soreness/discomfort while also noting random times of imbalance/buckling of the Rt knee. Patiently was highly limited with strengthening secondary to increased pain levels, especially with knee flexion. PT also assessed knee for edema, which was increased compared to Lt knee as well as subjectively increased according to patient. Due to increased pain and edema, patient and PT decided to end session on vaso. Patient will continue to benefit from skilled PT.    OBJECTIVE IMPAIRMENTS: Abnormal gait, decreased activity tolerance, decreased balance, decreased coordination, decreased endurance, decreased mobility, difficulty walking, decreased ROM, decreased strength, hypomobility, increased edema, increased fascial restrictions, impaired perceived functional ability, increased muscle spasms, impaired flexibility, improper body mechanics, and pain.   ACTIVITY LIMITATIONS: carrying, lifting, bending, sitting, standing, squatting, sleeping, stairs, transfers, bed mobility, and locomotion level  PARTICIPATION LIMITATIONS: meal prep, cleaning, laundry, interpersonal relationship, driving, shopping, community activity, occupation, and yard work  PERSONAL FACTORS: Anxiety, HTN are also affecting patient's functional outcome.   REHAB POTENTIAL: Good  CLINICAL DECISION MAKING: Stable/uncomplicated  EVALUATION COMPLEXITY: Low   GOALS: Goals reviewed with patient? Yes  SHORT TERM GOALS: (target date for Short term goals are 3 weeks 09/15/2023)   1.  Patient will demonstrate independent use of home exercise program to maintain progress from in clinic treatments.  Goal status: Met 10/07/2023  LONG TERM GOALS: (target dates for all long term goals are 10  weeks  11/03/2023 )   1. Patient will demonstrate/report pain at worst less than or equal to 2/10 to facilitate minimal limitation in daily activity secondary to pain symptoms.  Goal status: Ongoing   10/07/2023   2. Patient will demonstrate independent use of home exercise program to facilitate ability to maintain/progress functional gains from skilled physical therapy services.  Goal status: Ongoing   718/2025   3. Patient will demonstrate Patient specific functional scale avg > or = 8/10 to indicate reduced disability due to condition.   Goal status: Ongoing   09/06/2023   4.  Patient will demonstrate Rt LE MMT 5/5, dynamometry within 15 % of Lt for knee extension to faciltiate usual transfers, stairs, squatting at PLOF for daily life.   (No testing on eval for dynamometry due to surgical  protocol)  Goal status: Ongoing   10/07/2023   5.  Patient will demonstrate independent ambulation > 500 ft for community integration at [facility].  Goal status:   Ongoing   10/07/2023   6.  Patient will demonstrate ascending/descending stairs reciprocally s UE assist for community integration.   Goal status: Ongoing   10/07/2023   7.  Patient will demonstrate SLS bilateral > 30 seconds for stability in ambualtion.  Goal Status: Ongoing   10/07/2023   PLAN:  PT FREQUENCY: 2x/week  PT DURATION: 10 weeks  PLANNED INTERVENTIONS: Can include 02853- PT Re-evaluation, 97110-Therapeutic exercises, 97530- Therapeutic activity, 97112- Neuromuscular re-education, 97535- Self Care, 97140- Manual therapy, [phone]- Gait training, [phone]- Orthotic Fit/training, [phone]- Canalith repositioning, [id number]- Aquatic Therapy, [phone]- Electrical stimulation (unattended), [phone]- Electrical stimulation (manual), [id number] Physical performance testing, 97016- Vasopneumatic device, [id number]- Ultrasound, [id number]- Traction (mechanical), [id number]- Ionotophoresis 4mg /ml Dexamethasone , Patient/Family education, Balance training, Stair training, Taping, Dry  Needling, Joint mobilization, Joint manipulation, Spinal manipulation, Spinal mobilization, Scar mobilization, Vestibular training, Visual/preceptual remediation/compensation, DME instructions, Cryotherapy, and Moist heat.  All performed as medically necessary.  All included unless contraindicated  PLAN FOR NEXT SESSION: Check updated HEP and continue to progress with more functional standing activities.  Assess ability to return to gym. Check how patient is using treadmill at the gym.  Balance activities including single-leg stance, and quad activation strengthening exercises in standing  10/18/2023 KO / brace and crutches discontinued by [name].   Susannah Daring, PT, DPT 10/28/23 2:51 PM

## 2023-10-28 ENCOUNTER — Ambulatory Visit

## 2023-10-28 DIAGNOSIS — M25661 Stiffness of right knee, not elsewhere classified: Secondary | ICD-10-CM

## 2023-10-28 DIAGNOSIS — R600 Localized edema: Secondary | ICD-10-CM

## 2023-10-28 DIAGNOSIS — M6281 Muscle weakness (generalized): Secondary | ICD-10-CM

## 2023-10-28 DIAGNOSIS — M25561 Pain in right knee: Secondary | ICD-10-CM

## 2023-10-28 DIAGNOSIS — R262 Difficulty in walking, not elsewhere classified: Secondary | ICD-10-CM | POA: Diagnosis not present

## 2023-11-01 ENCOUNTER — Ambulatory Visit: Admitting: Rehabilitative and Restorative Service Providers"

## 2023-11-01 ENCOUNTER — Encounter: Payer: Self-pay | Admitting: Rehabilitative and Restorative Service Providers"

## 2023-11-01 DIAGNOSIS — M25661 Stiffness of right knee, not elsewhere classified: Secondary | ICD-10-CM

## 2023-11-01 DIAGNOSIS — M25561 Pain in right knee: Secondary | ICD-10-CM | POA: Diagnosis not present

## 2023-11-01 DIAGNOSIS — M6281 Muscle weakness (generalized): Secondary | ICD-10-CM | POA: Diagnosis not present

## 2023-11-01 DIAGNOSIS — R262 Difficulty in walking, not elsewhere classified: Secondary | ICD-10-CM | POA: Diagnosis not present

## 2023-11-01 DIAGNOSIS — R600 Localized edema: Secondary | ICD-10-CM

## 2023-11-01 NOTE — Therapy (Signed)
 OUTPATIENT PHYSICAL THERAPY TREATMENT    Patient Name: Dale Fitzgerald MRN: [id number] DOB:[birth year], 38 y.o., male Today's Date: 11/01/2023  END OF SESSION:  PT End of Session - 11/01/23 1434     Visit Number 14    Number of Visits 20    Date for PT Re-Evaluation 11/03/23    Authorization Type [employer] $40 copay    PT Start Time 1426    PT Stop Time 1515    PT Time Calculation (min) 49 min    Activity Tolerance Patient limited by pain    Behavior During Therapy [facility] for tasks assessed/performed               Past Medical History:  Diagnosis Date   Anxiety    Hypertension    Past Surgical History:  Procedure Laterality Date   APPENDECTOMY     Patient Active Problem List   Diagnosis Date Noted   Patellar tendon rupture, right, initial encounter 07/12/2023   Depression     PCP: No provider listed in epic.   REFERRING PROVIDER: Jule Ronal LITTIE DEVONNA  REFERRING DIAG: [phone] (ICD-10-CM) - Patellar tendon rupture, right, initial encounter  THERAPY DIAG:  Acute pain of right knee  Muscle weakness (generalized)  Stiffness of right knee, not elsewhere classified  Difficulty in walking, not elsewhere classified  Localized edema  Rationale for Evaluation and Treatment: Rehabilitation  ONSET DATE: Surgery 07/21/2023  SUBJECTIVE:   SUBJECTIVE STATEMENT:  Pt indicated sometimes wearing brace due to knee buckling feeling.  Reported about 4-5 x in a day yesterday.  Reported knee can feel full at times.   PERTINENT HISTORY: Right patella tendon rupture 07/11/2023 with surgery 07/21/2023. Had been wearing hinge brace 0-30 degrees prior to 08/23/2023 PA visit.  Plan for follow up in 4 weeks for brace unlocking to 90 deg.  Currently set from 0-60.    PAIN:  NPRS scale: at worst in last 24 hours 5/10 Pain location: Rt knee  Pain description: shock, tight Aggravating factors: end ranges, WB pressure Relieving factors: has used heat but not   PRECAUTIONS: Knee - No in  house protocol directly provided.  Respected protocol from [facility] in folder bin in clinic.  10/05/23: per Doctors orders, okay for out of brace PROM as tolerated   WEIGHT BEARING RESTRICTIONS: Brace 0-90 per PA visit on 09/20/2023   FALLS:  Has patient fallen in last 6 months? Yes. Number of falls 1 - current injury  LIVING ENVIRONMENT: Lives in: House/apartment Stairs: Flight of stairs in house.    Outside small step for porch without rail Has following equipment at home: Bilateral crutches, locking stairs.   OCCUPATION: Has worked 3 jobs. 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   PLOF: Independent, basketball recreationally, playing sports with son.    PATIENT GOALS: Reduce pain, get back to work/life. Get stronger  OBJECTIVE:   PATIENT SURVEYS:  Patient-Specific Activity Scoring Scheme  0 represents "unable to perform." 10 represents "able to perform at prior level. 0 1 2 3 4 5 6 7 8 9  10 (Date and Score)   Activity Eval  08/25/2023  11/01/2023  1. Walking independently  0 5   2. Stairs  4  4  3. work 0 4  4.Recreational sport 0 0  5.    Score 1 avg 2.75 avg   Total score = sum of the activity scores/number of activities Minimum detectable change (90%CI)  for average score = 2 points Minimum detectable change (90%CI) for single activity score = 3 points  COGNITION: 08/25/2023 Overall cognitive status: WFL    SENSATION: 08/25/2023 WFL  EDEMA:  08/25/2023 Localized edema Rt knee  MUSCLE LENGTH: 08/25/2023 No specific testing.   POSTURE:  08/25/2023 Unremarkable   PALPATION: 08/25/2023 Mild tenderness around incision.   LOWER EXTREMITY ROM:   ROM Right Eval 08/25/2023 Left Eval 08/25/2023 Right 09/15/2023 Right  10/05/2023 Right 10/12/2023  Right 11/01/2023  Knee flexion 60 AROM in supine heel slide with pain   P: 92 in supine with pain   AROM in supine heel slide 92  Knee extension -16 AROM in seated LAQ  -9 in  supine heel prop  -5 in seated LAQ AROM Rt -4 in supine with heel prop 0 in heel prop 0 AROM in leg raise seated   (Blank rows = not tested)  LOWER EXTREMITY MMT:  MMT Right Eval 08/25/2023 Left Eval 08/25/2023 Right 09/19/2023   Hip flexion 3+/5 5/5 5/5   Hip extension      Hip abduction      Hip adduction      Hip internal rotation      Hip external rotation      Knee flexion 4/5 5/5 4/5   Knee extension NT 5/5 3+/5   Ankle dorsiflexion 5/5  5/5   Ankle plantarflexion      Ankle inversion      Ankle eversion       (Blank rows = not tested)  LOWER EXTREMITY SPECIAL TESTS:  08/25/2023 No specific testing.   FUNCTIONAL TESTS:  08/25/2023 18 inch chair transfer: Unable unassist  Lt SLS: not tested Rt SLS: unable   GAIT: 11/01/2023: Independent ambulation, no brace.  Deviations noted in stance on Rt leg, toe off progression.   09/22/2023: Single axillary crutch, brace 0-90 deg.   09/15/2023: One axillary crutch with brace 0-60 deg.   08/25/2023 Hinge brace 0-60 degrees with bilateral axillary crutches.  Step to pattern primarily with partial WB noted on Rt leg. Associated deviations due to brace locking at 60 deg (hip circumduction, hike etc)  BFR Borderline cuff 4 or 5.  Selected 5 for assessment. LOP in supine 216 mmHg                                                                                                                                                                       TODAY'S TREATMENT:        DATE: 11/01/2023 Therex: Recumbent bike seat 11 10 mins lvl 3 with intervals for :10 seconds at top of each minute from min 2 to min 8.  Seated quad set with SLR x 6 Rt leg Continued cues on quad  set use at home with progression to SLR as able.   Neuro Re-ed Standing small pball TKE Rt leg at wall 5 sec hold x 10  SLS with contralateral leg reactive blazepod touching 30 sec x 3 bilateral in // bars with occasional HHA  Theractivity Leg press double leg 87 lbs x 15  with slow lowering focus Leg press single leg 43 lbs 2 x 15 bilateral with slow lowering focus  TRX strap assisted squat above chair x 20   TODAY'S TREATMENT:        DATE: 10/28/2023 TherEx:  Recumbent bike seat 11, level 3 for 4 minutes, level 5 for 4 minutes  Leg press  Bilat LEs: 87# 1x15, 1x15 at a slower pace with focus on controlling knee extension  Rt LE: highly painful this date, discontinued after 2 reps Bilat  LE concentric with R LE only eccentric, 2x10 with verbal cues for decreasing ROM secondary to increased pain RDLs with 20# KB 1x10; discontinued secondary to pain   Neuro Re-Ed:  Tandem stance with reactive balance control while catching and tossing ball in parallel bars 3x30s each leg in back  Intermittent UE use due to minimal lateral LOB  TKE with green TB in parallel bars ; 1x10 quickly, 2x5 slow and controlled with 3s hold  patient noting intermittent sensation of buckling   Vaso:  Rt LE on wedge for 10 minutes with medium compression at 34deg    TODAY'S TREATMENT:        DATE:10/20/2023 Therex:  Recumbent bike, full circle, lvl 5, seat 11, for 8 minutes; PT recommended using seated cardio equipment at the gym like the bike for warm up and cool down.  He can use the treadmill as he reported using. Leg press BLEs, 87#, 15x RLE only, 42#, 10x. Patient reported increased pain. BLE concentric and  RLE only isometric /eccentric, 62#, 2x10 RLE mini squat for terminal extension control, 50# 2x10 PT educated patient on using leg press at gym with the above for exercises.  Patient verbalized understanding.  Neuro Re-Ed: Braiding w/ UE regression: BUEs to LUE (contralateral) to RUE (ipsilateral increased weightbearing RLE).  PT cues on alternating knee extension and flexion to perform the activity safely. Tandem stance: First rep of each set LLE back, second rep of each set RLE back, first set on floor with eyes open, second set on floor with eyes closed, third set eyes  open on foam.  4 set on foam with eyes closed: 30 seconds each position. Minimal sway noted in intermittent touch //bars.  PT cued for HEP to perform between sink and chair back for safety.  Patient verbalized understanding TKE with green resistance band, 1x10.  RUE support on chair back.  PT cued technique including upright posture. Progressed TKE with RLE SLS for 2 seconds, 1x10.  Patient had difficulty keeping the right knee in extension while in SLS.   Updated HEP with H0, demo & verbal cues. SPT verbally reviewed HO with patient. Patient verbalized understanding.  TREATMENT:        DATE: 10/12/2023 Therex: Recumbent bike partial circles with seat all the way back 6 mins Leg press double leg in brace 0-90 deg 75 lbs, single leg 31 lbs 2 x 15 Seated quad set with SLR Rt x 3 in sitting.  - stopped due to pain symptoms.    Neuro Re-ed SLS with contralateral leg clearing over hurdle 6 inch x 10 bilateral in // bars Step over and back 4 inch with  forward and retro weight shift in // bars x 10 bilateral  Seated quad set 5 sec hold x 10 Rt leg Seated alternating Rt knee extension/ flexion isometrics 5 sec holds x 12 (detailed use at home with opposite leg for extension)     PATIENT EDUCATION:  09/22/2023 Education details: HEP update Person educated: Patient Education method: Programmer, multimedia, Demonstration, Verbal cues, and Handouts Education comprehension: verbalized understanding, returned demonstration, and verbal cues required  HOME EXERCISE PROGRAM: Access Code: [email or web address] URL: [email or web address] Date: 10/20/2023 Prepared by: Grayce Spatz   Exercises - Supine Heel Slide  - 3-5 x daily - 7 x weekly - 1 sets - 10 reps - 5 hold - Seated Long Arc Quad  - 3-5 x daily - 7 x weekly - 1 sets - 5-10 reps - 2 hold - Supine Knee Extension Mobilization with Weight  - 4-5 x daily - 7 x weekly - 1 sets - 1 reps - to tolerance up to 5 mins hold - Supine Quadricep Sets  - 3-5 x daily  - 7 x weekly - 1 sets - 10 reps - 5 hold - Seated Quad Set  - 3-5 x daily - 7 x weekly - 1 sets - 10 reps - 5 hold - Seated Straight Leg Heel Taps  - 1-2 x daily - 7 x weekly - 3 sets - 10 reps - Standing Terminal Knee Extension with Resistance  - 1 x daily - 7 x weekly - 2-3 sets - 10 reps - 3-5 seconds hold - Braided Sidestepping  - 1 x daily - 7 x weekly - 4-5 sets - 5-7 reps - Tandem Stance  - 1 x daily - 7 x weekly - 4 sets - 2 reps - 30 seconds hold  ASSESSMENT:  CLINICAL IMPRESSION:  Mostly good tolerance to intervention in clinic today with occasional times of discomfort noted in strengthening program.  Most noted in SLR attempt after several reps and fatigue started.  Pt has had difficulty with progressions on loading which has impacted strengthening progression.  Continued skilled PT services.    OBJECTIVE IMPAIRMENTS: Abnormal gait, decreased activity tolerance, decreased balance, decreased coordination, decreased endurance, decreased mobility, difficulty walking, decreased ROM, decreased strength, hypomobility, increased edema, increased fascial restrictions, impaired perceived functional ability, increased muscle spasms, impaired flexibility, improper body mechanics, and pain.   ACTIVITY LIMITATIONS: carrying, lifting, bending, sitting, standing, squatting, sleeping, stairs, transfers, bed mobility, and locomotion level  PARTICIPATION LIMITATIONS: meal prep, cleaning, laundry, interpersonal relationship, driving, shopping, community activity, occupation, and yard work  PERSONAL FACTORS: Anxiety, HTN are also affecting patient's functional outcome.   REHAB POTENTIAL: Good  CLINICAL DECISION MAKING: Stable/uncomplicated  EVALUATION COMPLEXITY: Low   GOALS: Goals reviewed with patient? Yes  SHORT TERM GOALS: (target date for Short term goals are 3 weeks 09/15/2023)   1.  Patient will demonstrate independent use of home exercise program to maintain progress from in clinic  treatments.  Goal status: Met 10/07/2023  LONG TERM GOALS: (target dates for all long term goals are 10 weeks  11/03/2023 )   1. Patient will demonstrate/report pain at worst less than or equal to 2/10 to facilitate minimal limitation in daily activity secondary to pain symptoms.  Goal status: Ongoing   11/01/2023   2. Patient will demonstrate independent use of home exercise program to facilitate ability to maintain/progress functional gains from skilled physical therapy services.  Goal status: Ongoing   11/01/2023   3. Patient will demonstrate Patient specific functional scale avg >  or = 8/10 to indicate reduced disability due to condition.   Goal status: Ongoing   11/01/2023   4.  Patient will demonstrate Rt LE MMT 5/5, dynamometry within 15 % of Lt for knee extension to faciltiate usual transfers, stairs, squatting at PLOF for daily life.   (No testing on eval for dynamometry due to surgical protocol)  Goal status: Ongoing   11/01/2023   5.  Patient will demonstrate independent ambulation > 500 ft for community integration at [facility].  Goal status:   Ongoing   11/01/2023   6.  Patient will demonstrate ascending/descending stairs reciprocally s UE assist for community integration.   Goal status: Ongoing   11/01/2023   7.  Patient will demonstrate SLS bilateral > 30 seconds for stability in ambualtion.  Goal Status: Ongoing   11/01/2023   PLAN:  PT FREQUENCY: 2x/week  PT DURATION: 10 weeks  PLANNED INTERVENTIONS: Can include 02853- PT Re-evaluation, 97110-Therapeutic exercises, 97530- Therapeutic activity, 97112- Neuromuscular re-education, 97535- Self Care, 97140- Manual therapy, [phone]- Gait training, [phone]- Orthotic Fit/training, [phone]- Canalith repositioning, [id number]- Aquatic Therapy, [phone]- Electrical stimulation (unattended), [phone]- Electrical stimulation (manual), [id number] Physical performance testing, 97016- Vasopneumatic device, [id number]- Ultrasound, [id number]- Traction (mechanical), [id number]-  Ionotophoresis 4mg /ml Dexamethasone , Patient/Family education, Balance training, Stair training, Taping, Dry Needling, Joint mobilization, Joint manipulation, Spinal manipulation, Spinal mobilization, Scar mobilization, Vestibular training, Visual/preceptual remediation/compensation, DME instructions, Cryotherapy, and Moist heat.  All performed as medically necessary.  All included unless contraindicated  PLAN FOR NEXT SESSION: Progressive strengthening as able with mindful about pain symptoms.  Balance/WB acceptance improvements to help improve ambulation control.   RECERT required 11/03/2023 or first visit after.     Ozell Silvan, PT, DPT, OCS, ATC 11/01/23  3:06 PM

## 2023-11-18 ENCOUNTER — Encounter: Admitting: Rehabilitative and Restorative Service Providers"

## 2023-11-18 ENCOUNTER — Ambulatory Visit (INDEPENDENT_AMBULATORY_CARE_PROVIDER_SITE_OTHER): Admitting: Rehabilitative and Restorative Service Providers"

## 2023-11-18 ENCOUNTER — Encounter: Payer: Self-pay | Admitting: Rehabilitative and Restorative Service Providers"

## 2023-11-18 DIAGNOSIS — M25661 Stiffness of right knee, not elsewhere classified: Secondary | ICD-10-CM

## 2023-11-18 DIAGNOSIS — M25561 Pain in right knee: Secondary | ICD-10-CM | POA: Diagnosis not present

## 2023-11-18 DIAGNOSIS — M6281 Muscle weakness (generalized): Secondary | ICD-10-CM

## 2023-11-18 DIAGNOSIS — R262 Difficulty in walking, not elsewhere classified: Secondary | ICD-10-CM | POA: Diagnosis not present

## 2023-11-18 DIAGNOSIS — R600 Localized edema: Secondary | ICD-10-CM

## 2023-11-18 NOTE — Therapy (Addendum)
 OUTPATIENT PHYSICAL THERAPY TREATMENT/RE-CERTIFICATION    Patient Name: Dale Fitzgerald MRN: [id number] DOB:Feb 07, 1986, 38 y.o., male Today's Date: 11/18/2023  END OF SESSION:  PT End of Session - 11/18/23 1519     Visit Number 15    Number of Visits 20    Date for PT Re-Evaluation 12/16/23    Authorization Type [employer] $40 copay    PT Start Time 1518    PT Stop Time 1548    PT Time Calculation (min) 30 min    Activity Tolerance Patient limited by pain    Behavior During Therapy [facility] for tasks assessed/performed                Past Medical History:  Diagnosis Date   Anxiety    Hypertension    Past Surgical History:  Procedure Laterality Date   APPENDECTOMY     Patient Active Problem List   Diagnosis Date Noted   Patellar tendon rupture, right, initial encounter 07/12/2023   Depression     PCP: No provider listed in epic.   REFERRING PROVIDER: Jule Ronal LITTIE DEVONNA  REFERRING DIAG: [id number] (ICD-10-CM) - Patellar tendon rupture, right, initial encounter  THERAPY DIAG:  Acute pain of right knee - Plan: PT plan of care cert/re-cert  Muscle weakness (generalized) - Plan: PT plan of care cert/re-cert  Stiffness of right knee, not elsewhere classified - Plan: PT plan of care cert/re-cert  Difficulty in walking, not elsewhere classified - Plan: PT plan of care cert/re-cert  Localized edema - Plan: PT plan of care cert/re-cert  Rationale for Evaluation and Treatment: Rehabilitation  ONSET DATE: Surgery 07/21/2023  SUBJECTIVE:   SUBJECTIVE STATEMENT:  Jevonte has returned to work where he is required to stand for 8 to 12 hours per shift.  He reports doing better at the beginning of his shift and at the beginning of the week.  Today was the end of his third straight day at work and he reports instability, weakness and soreness.  PERTINENT HISTORY: Right patella tendon rupture 07/11/2023 with surgery 07/21/2023. Had been wearing hinge brace 0-30 degrees prior to  08/23/2023 PA visit.  Plan for follow up in 4 weeks for brace unlocking to 90 deg.  Currently set from 0-60.    PAIN:  NPRS scale: Brief 5/10 with instability, otherwise 2-3/10 Pain location: Rt knee  Pain description: Mostly tightness but can be sharp with instability Aggravating factors: Weight-bearing and end range for flexion Relieving factors: Rest, exercises  PRECAUTIONS: Knee - No in house protocol directly provided.  Respected protocol from [facility] in folder bin in clinic.  10/05/23: per Doctors orders, okay for out of brace PROM as tolerated   WEIGHT BEARING RESTRICTIONS: Brace 0-90 per PA visit on 09/20/2023   FALLS:  Has patient fallen in last 6 months? Yes. Number of falls 1 - current injury  LIVING ENVIRONMENT: Lives in: House/apartment Stairs: Flight of stairs in house.    Outside small step for porch without rail Has following equipment at home: Bilateral crutches, locking stairs.   OCCUPATION: Has worked 3 jobs. 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   PLOF: Independent, basketball recreationally, playing sports with son.    PATIENT GOALS: Reduce pain, get back to work/life. Get stronger  OBJECTIVE:   PATIENT SURVEYS:  Patient-Specific Activity Scoring Scheme  0 represents "unable to perform." 10 represents "able to perform at prior level. 0 1 2 3 4 5  6  7 8 9 10  (Date and Score)   Activity Eval  08/25/2023  11/01/2023  1. Walking independently  0 5   2. Stairs  4  4  3. work 0 4  4.Recreational sport 0 0  5.    Score 1 avg 2.75 avg   Total score = sum of the activity scores/number of activities Minimum detectable change (90%CI) for average score = 2 points Minimum detectable change (90%CI) for single activity score = 3 points  COGNITION: 08/25/2023 Overall cognitive status: WFL    SENSATION: 08/25/2023 WFL  EDEMA:  08/25/2023 Localized edema Rt knee  MUSCLE LENGTH: 08/25/2023 No specific testing.    POSTURE:  08/25/2023 Unremarkable   PALPATION: 08/25/2023 Mild tenderness around incision.   LOWER EXTREMITY ROM:   ROM Right Eval 08/25/2023 Left Eval 08/25/2023 Right 09/15/2023 Right  10/05/2023 Right 10/12/2023  Right 11/01/2023  Knee flexion 60 AROM in supine heel slide with pain   P: 92 in supine with pain   AROM in supine heel slide 92  Knee extension -16 AROM in seated LAQ  -9 in supine heel prop  -5 in seated LAQ AROM Rt -4 in supine with heel prop 0 in heel prop 0 AROM in leg raise seated   (Blank rows = not tested)  LOWER EXTREMITY MMT:  MMT Right Eval 08/25/2023 Left Eval 08/25/2023 Right 09/19/2023   Hip flexion 3+/5 5/5 5/5   Hip extension      Hip abduction      Hip adduction      Hip internal rotation      Hip external rotation      Knee flexion 4/5 5/5 4/5   Knee extension NT 5/5 3+/5   Ankle dorsiflexion 5/5  5/5   Ankle plantarflexion      Ankle inversion      Ankle eversion       (Blank rows = not tested)  LOWER EXTREMITY SPECIAL TESTS:  08/25/2023 No specific testing.   FUNCTIONAL TESTS:  08/25/2023 18 inch chair transfer: Unable unassist  Lt SLS: not tested Rt SLS: unable   GAIT: 11/01/2023: Independent ambulation, no brace.  Deviations noted in stance on Rt leg, toe off progression.   09/22/2023: Single axillary crutch, brace 0-90 deg.   09/15/2023: One axillary crutch with brace 0-60 deg.   08/25/2023 Hinge brace 0-60 degrees with bilateral axillary crutches.  Step to pattern primarily with partial WB noted on Rt leg. Associated deviations due to brace locking at 60 deg (hip circumduction, hike etc)  BFR Borderline cuff 4 or 5.  Selected 5 for assessment. LOP in supine 216 mmHg  TODAY'S TREATMENT:        DATE: 11/18/2023 Recumbent bike Seat 11 Level 5 for 4 minutes Quadriceps sets  with pillows under right knee 2 sets of 10 for 5 seconds Attempted seated straight leg raises, stopped due to pain (3 straight days of standing at work for 8 hours a day) Knee extension machine 90 to 30 degrees, bilateral and down the right side only with slow eccentric 15#15 reps  Neuromuscular reeducation: Tandem balance in parallel bars with eyes open; head turning and eyes closed 2 x 20 seconds each  Functional Activities: Hip hike in parallel bar with knees unlocked (for standing endurance and to encourage avoiding hyperextension with gait) 10 x 3 seconds   TODAY'S TREATMENT:        DATE: 11/01/2023 Therex: Recumbent bike seat 11 10 mins lvl 3 with intervals for :10 seconds at top of each minute from min 2 to min 8.  Seated quad set with SLR x 6 Rt leg Continued cues on quad set use at home with progression to SLR as able.   Neuro Re-ed Standing small pball TKE Rt leg at wall 5 sec hold x 10  SLS with contralateral leg reactive blazepod touching 30 sec x 3 bilateral in // bars with occasional HHA  Theractivity Leg press double leg 87 lbs x 15 with slow lowering focus Leg press single leg 43 lbs 2 x 15 bilateral with slow lowering focus  TRX strap assisted squat above chair x 20   TODAY'S TREATMENT:        DATE: 10/28/2023 TherEx:  Recumbent bike seat 11, level 3 for 4 minutes, level 5 for 4 minutes  Leg press  Bilat LEs: 87# 1x15, 1x15 at a slower pace with focus on controlling knee extension  Rt LE: highly painful this date, discontinued after 2 reps Bilat  LE concentric with R LE only eccentric, 2x10 with verbal cues for decreasing ROM secondary to increased pain RDLs with 20# KB 1x10; discontinued secondary to pain   Neuro Re-Ed:  Tandem stance with reactive balance control while catching and tossing ball in parallel bars 3x30s each leg in back  Intermittent UE use due to minimal lateral LOB  TKE with green TB in parallel bars ; 1x10 quickly, 2x5 slow and controlled with  3s hold  patient noting intermittent sensation of buckling   Vaso:  Rt LE on wedge for 10 minutes with medium compression at 34deg     PATIENT EDUCATION:  09/22/2023 Education details: HEP update Person educated: Patient Education method: Programmer, multimedia, Demonstration, Verbal cues, and Handouts Education comprehension: verbalized understanding, returned demonstration, and verbal cues required  HOME EXERCISE PROGRAM: Access Code: [email or web address] URL: [email or web address] Date: 10/20/2023 Prepared by: Grayce Spatz   Exercises - Supine Heel Slide  - 3-5 x daily - 7 x weekly - 1 sets - 10 reps - 5 hold - Seated Long Arc Quad  - 3-5 x daily - 7 x weekly - 1 sets - 5-10 reps - 2 hold - Supine Knee Extension Mobilization with Weight  - 4-5 x daily - 7 x weekly - 1 sets - 1 reps - to tolerance up to 5 mins hold - Supine Quadricep Sets  - 3-5 x daily - 7 x weekly - 1 sets - 10 reps - 5 hold - Seated Quad Set  - 3-5 x daily - 7 x weekly - 1 sets - 10 reps - 5 hold - Seated Straight Leg Heel Taps  - 1-2 x daily -  7 x weekly - 3 sets - 10 reps - Standing Terminal Knee Extension with Resistance  - 1 x daily - 7 x weekly - 2-3 sets - 10 reps - 3-5 seconds hold - Braided Sidestepping  - 1 x daily - 7 x weekly - 4-5 sets - 5-7 reps - Tandem Stance  - 1 x daily - 7 x weekly - 4 sets - 2 reps - 30 seconds hold  ASSESSMENT:  CLINICAL IMPRESSION:  Quadriceps strength and endurance remains the focus with [facility]'s home and clinic program.  He reports instability, weakness and increased pain/soreness after his 8+ hour shift and after several days in a row standing at work.  Chair notes he is about to start a 12-hour shift next week and he has concerns about how his body will hold up with this, given the soreness and instability after an 8-hour day.  Jaxen has obvious quadriceps atrophy.  Combined with the instability, Venetia will certainly benefit from continuing supervised strength and flexibility  work to make him more comfortable with return to his normal work activities without fear of his knee giving out or additional joint injury.     OBJECTIVE IMPAIRMENTS: Abnormal gait, decreased activity tolerance, decreased balance, decreased coordination, decreased endurance, decreased mobility, difficulty walking, decreased ROM, decreased strength, hypomobility, increased edema, increased fascial restrictions, impaired perceived functional ability, increased muscle spasms, impaired flexibility, improper body mechanics, and pain.   ACTIVITY LIMITATIONS: carrying, lifting, bending, sitting, standing, squatting, sleeping, stairs, transfers, bed mobility, and locomotion level  PARTICIPATION LIMITATIONS: meal prep, cleaning, laundry, interpersonal relationship, driving, shopping, community activity, occupation, and yard work  PERSONAL FACTORS: Anxiety, HTN are also affecting patient's functional outcome.   REHAB POTENTIAL: Good  CLINICAL DECISION MAKING: Stable/uncomplicated  EVALUATION COMPLEXITY: Low   GOALS: Goals reviewed with patient? Yes  SHORT TERM GOALS: (target date for Short term goals are 3 weeks 09/15/2023)   1.  Patient will demonstrate independent use of home exercise program to maintain progress from in clinic treatments.  Goal status: Met 10/07/2023  LONG TERM GOALS: (target date for all long term goals is 12/16/2023)   1. Patient will demonstrate/report pain at worst less than or equal to 2/10 to facilitate minimal limitation in daily activity secondary to pain symptoms.  Goal status: Ongoing   11/18/2023   2. Patient will demonstrate independent use of home exercise program to facilitate ability to maintain/progress functional gains from skilled physical therapy services.  Goal status: Ongoing   11/18/2023   3. Patient will demonstrate Patient specific functional scale avg > or = 8/10 to indicate reduced disability due to condition.   Goal status: Ongoing   11/14/2023    4.  Patient will demonstrate Rt LE MMT 5/5, dynamometry within 15 % of Lt for knee extension to faciltiate usual transfers, stairs, squatting at PLOF for daily life.   (No testing on eval for dynamometry due to surgical protocol)  Goal status: Ongoing   11/01/2023   5.  Patient will demonstrate independent ambulation > 500 ft for community integration at [facility]  Goal status:   Ongoing   11/18/2023   6.  Patient will demonstrate ascending/descending stairs reciprocally s UE assist for community integration.   Goal status: Ongoing   11/18/2023   7.  Patient will demonstrate SLS bilateral > 30 seconds for stability in ambualtion.  Goal Status: Ongoing   11/18/2023   PLAN:  PT FREQUENCY: 2x/week  PT DURATION: 4 weeks  PLANNED INTERVENTIONS: Can include 02853- PT  Re-evaluation, 97110-Therapeutic exercises, 97530- Therapeutic activity, [id number]- Neuromuscular re-education, [id number]- Self Care, 02859- Manual therapy, [phone]- Gait training, [phone]- Orthotic Fit/training, [phone]- Canalith repositioning, [id number]- Aquatic Therapy, [phone]- Electrical stimulation (unattended), [phone]- Electrical stimulation (manual), [id number] Physical performance testing, [id number]- Vasopneumatic device, [id number]- Ultrasound, [id number]- Traction (mechanical), [id number]- Ionotophoresis 4mg /ml Dexamethasone , Patient/Family education, Balance training, Stair training, Taping, Dry Needling, Joint mobilization, Joint manipulation, Spinal manipulation, Spinal mobilization, Scar mobilization, Vestibular training, Visual/preceptual remediation/compensation, DME instructions, Cryotherapy, and Moist heat.  All performed as medically necessary.  All included unless contraindicated  PLAN FOR NEXT SESSION: Progressive quadriceps strengthening, be mindful about pain symptoms.  Balance/WB acceptance improvements to help improve ambulation control.  Consider adding a gentle prone or supine quadriceps stretch.     Myer LELON Ivory PT, MPT 11/18/23  4:00 PM

## 2023-11-29 ENCOUNTER — Telehealth: Payer: Self-pay | Admitting: Orthopaedic Surgery

## 2023-11-29 NOTE — Telephone Encounter (Signed)
 Right patellar tendon repair 07/21/2023 . 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 Please advise.

## 2023-11-29 NOTE — Telephone Encounter (Signed)
 He should return for eval

## 2023-11-29 NOTE — Telephone Encounter (Signed)
 Patient called and said he experiencing swelling and pain discomfort, being that he started back working. Now it is raising red flags at the job and he would like if you could write him either out of work or can you put restrictions on the job so he will not lose it. CB#[phone]

## 2023-11-29 NOTE — Telephone Encounter (Signed)
 Called and scheduled for this Friday with Morna.

## 2023-12-02 ENCOUNTER — Ambulatory Visit: Admitting: Physician Assistant

## 2023-12-02 DIAGNOSIS — S86811D Strain of other muscle(s) and tendon(s) at lower leg level, right leg, subsequent encounter: Secondary | ICD-10-CM | POA: Diagnosis not present

## 2023-12-02 NOTE — Progress Notes (Signed)
   Post-Op Visit Note   Patient: Dale Fitzgerald           Date of Birth: May 10, 1985           MRN: [id number] Visit Date: 12/02/2023 PCP: Patient, No Pcp Per   Assessment & Plan:  Chief Complaint:  Chief Complaint  Patient presents with   Right Knee - Pain   Visit Diagnoses: No diagnosis found.  Plan: Patient is a 38 year old gentleman who comes in today approximately 19 weeks status post right knee patellar tendon repair, date of surgery 07/21/2023.  He was doing okay but returned to work on 11/16/2023 where he has been experiencing increased pain and swelling.  No new injury.  He does work 12-hour shifts and is on his feet all day.  He notes that his employer is also concerned.  He takes occasional Tylenol  and he is still in physical therapy.  Examination of his right knee reveals no effusion.  Range of motion 0 to 125 degrees.  4 out of 5 quad strength.  He is neurovascularly intact distally.  At this point, I have thoroughly discussed that he is not damaging anything standing on his feet for 12 hours at work but that he may have increased pain and swelling while doing so.  He tells me that he cannot return with any restrictions and that he cannot work shifts less than 12 hours at a time.  He feels he needs to stay out of work at this point.  He will follow-up with [name] in 4 weeks for repeat evaluation.  Call with concerns or questions.  Follow-Up Instructions: Return in about 4 weeks (around 12/30/2023) for with [name].   Orders:  No orders of the defined types were placed in this encounter.  No orders of the defined types were placed in this encounter.   Imaging: No results found.  PMFS History: Patient Active Problem List   Diagnosis Date Noted   Patellar tendon rupture, right, initial encounter 07/12/2023   Depression    Past Medical History:  Diagnosis Date   Anxiety    Hypertension     No family history on file.  Past Surgical History:  Procedure Laterality Date    APPENDECTOMY     Social History   Occupational History   Not on file  Tobacco Use   Smoking status: Every Day    Current packs/day: 0.50    Average packs/day: 0.5 packs/day for 10.0 years (5.0 ttl pk-yrs)    Types: Cigarettes   Smokeless tobacco: Not on file  Vaping Use   Vaping status: Never Used  Substance and Sexual Activity   Alcohol use: Yes   Drug use: Yes    Types: Marijuana   Sexual activity: Not on file

## 2023-12-04 NOTE — Therapy (Incomplete)
 OUTPATIENT PHYSICAL THERAPY TREATMENT Patient Name: Dale Fitzgerald MRN: [id number] DOB:Apr 25, 1985, 38 y.o., male Today's Date: 12/04/2023  END OF SESSION:***          Past Medical History:  Diagnosis Date   Anxiety    Hypertension    Past Surgical History:  Procedure Laterality Date   APPENDECTOMY     Patient Active Problem List   Diagnosis Date Noted   Patellar tendon rupture, right, initial encounter 07/12/2023   Depression     PCP: No provider listed in epic.   REFERRING PROVIDER: Jule Ronal LITTIE DEVONNA  REFERRING DIAG: [phone] (ICD-10-CM) - Patellar tendon rupture, right, initial encounter  THERAPY DIAG:  No diagnosis found.  Rationale for Evaluation and Treatment: Rehabilitation  ONSET DATE: Surgery 07/21/2023  SUBJECTIVE:   SUBJECTIVE STATEMENT:  ***  PERTINENT HISTORY: Right patella tendon rupture 07/11/2023 with surgery 07/21/2023. Had been wearing hinge brace 0-30 degrees prior to 08/23/2023 PA visit.  Plan for follow up in 4 weeks for brace unlocking to 90 deg.  Currently set from 0-60.    PAIN:  ***NPRS scale: Brief 5/10 with instability, otherwise 2-3/10 Pain location: Rt knee  Pain description: Mostly tightness but can be sharp with instability Aggravating factors: Weight-bearing and end range for flexion Relieving factors: Rest, exercises  PRECAUTIONS: Knee - No in house protocol directly provided.  Respected protocol from [facility] in folder bin in clinic.  10/05/23: per Doctors orders, okay for out of brace PROM as tolerated   WEIGHT BEARING RESTRICTIONS: Brace 0-90 per PA visit on 09/20/2023   FALLS:  Has patient fallen in last 6 months? Yes. Number of falls 1 - current injury  LIVING ENVIRONMENT: Lives in: House/apartment Stairs: Flight of stairs in house.    Outside small step for porch without rail Has following equipment at home: Bilateral crutches, locking stairs.   OCCUPATION: Has worked 3 jobs. 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   PLOF: Independent, basketball recreationally, playing sports with son.    PATIENT GOALS: Reduce pain, get back to work/life. Get stronger  OBJECTIVE:   PATIENT SURVEYS:  Patient-Specific Activity Scoring Scheme  0 represents "unable to perform." 10 represents "able to perform at prior level. 0 1 2 3 4 5 6 7 8 9  10 (Date and Score)   Activity Eval  08/25/2023  11/01/2023  1. Walking independently  0 5   2. Stairs  4  4  3. work 0 4  4.Recreational sport 0 0  5.    Score 1 avg 2.75 avg   Total score = sum of the activity scores/number of activities Minimum detectable change (90%CI) for average score = 2 points Minimum detectable change (90%CI) for single activity score = 3 points  COGNITION: 08/25/2023 Overall cognitive status: WFL    SENSATION: 08/25/2023 WFL  EDEMA:  08/25/2023 Localized edema Rt knee  MUSCLE LENGTH: 08/25/2023 No specific testing.   POSTURE:  08/25/2023 Unremarkable   PALPATION: 08/25/2023 Mild tenderness around incision.   LOWER EXTREMITY ROM:   ROM Right Eval 08/25/2023 Left Eval 08/25/2023 Right 09/15/2023 Right  10/05/2023 Right 10/12/2023  Right 11/01/2023  Knee flexion 60 AROM in supine heel slide with pain   P: 92 in supine with pain   AROM in supine heel slide 92  Knee extension -16 AROM in seated LAQ  -9 in supine heel prop  -5 in seated LAQ AROM Rt -4 in supine with heel  prop 0 in heel prop 0 AROM in leg raise seated   (Blank rows = not tested)  LOWER EXTREMITY MMT:  MMT Right Eval 08/25/2023 Left Eval 08/25/2023 Right 09/19/2023   Hip flexion 3+/5 5/5 5/5   Hip extension      Hip abduction      Hip adduction      Hip internal rotation      Hip external rotation      Knee flexion 4/5 5/5 4/5   Knee extension NT 5/5 3+/5   Ankle dorsiflexion 5/5  5/5   Ankle plantarflexion      Ankle inversion      Ankle eversion       (Blank rows = not tested)  LOWER EXTREMITY  SPECIAL TESTS:  08/25/2023 No specific testing.   FUNCTIONAL TESTS:  08/25/2023 18 inch chair transfer: Unable unassist  Lt SLS: not tested Rt SLS: unable   GAIT: 11/01/2023: Independent ambulation, no brace.  Deviations noted in stance on Rt leg, toe off progression.   09/22/2023: Single axillary crutch, brace 0-90 deg.   09/15/2023: One axillary crutch with brace 0-60 deg.   08/25/2023 Hinge brace 0-60 degrees with bilateral axillary crutches.  Step to pattern primarily with partial WB noted on Rt leg. Associated deviations due to brace locking at 60 deg (hip circumduction, hike etc)  BFR Borderline cuff 4 or 5.  Selected 5 for assessment. LOP in supine 216 mmHg                                                                                                                                                                       TODAY'S TREATMENT:  DATE: 12/05/23***  ***Respected protocol from [facility] in folder bin in clinic.  DATE: 11/18/2023 Recumbent bike Seat 11 Level 5 for 4 minutes Quadriceps sets with pillows under right knee 2 sets of 10 for 5 seconds Attempted seated straight leg raises, stopped due to pain (3 straight days of standing at work for 8 hours a day) Knee extension machine 90 to 30 degrees, bilateral and down the right side only with slow eccentric 15#15 reps  Neuromuscular reeducation: Tandem balance in parallel bars with eyes open; head turning and eyes closed 2 x 20 seconds each  Functional Activities: Hip hike in parallel bar with knees unlocked (for standing endurance and to encourage avoiding hyperextension with gait) 10 x 3 seconds   TODAY'S TREATMENT:        DATE: 11/01/2023 Therex: Recumbent bike seat 11 10 mins lvl 3 with intervals for :10 seconds at top of each minute from min 2 to min 8.  Seated quad set with SLR x 6 Rt leg Continued cues on quad set use at home with progression to  SLR as able.   Neuro Re-ed Standing small pball TKE Rt leg at wall 5  sec hold x 10  SLS with contralateral leg reactive blazepod touching 30 sec x 3 bilateral in // bars with occasional HHA  Theractivity Leg press double leg 87 lbs x 15 with slow lowering focus Leg press single leg 43 lbs 2 x 15 bilateral with slow lowering focus  TRX strap assisted squat above chair x 20   TODAY'S TREATMENT:        DATE: 10/28/2023 TherEx:  Recumbent bike seat 11, level 3 for 4 minutes, level 5 for 4 minutes  Leg press  Bilat LEs: 87# 1x15, 1x15 at a slower pace with focus on controlling knee extension  Rt LE: highly painful this date, discontinued after 2 reps Bilat  LE concentric with R LE only eccentric, 2x10 with verbal cues for decreasing ROM secondary to increased pain RDLs with 20# KB 1x10; discontinued secondary to pain   Neuro Re-Ed:  Tandem stance with reactive balance control while catching and tossing ball in parallel bars 3x30s each leg in back  Intermittent UE use due to minimal lateral LOB  TKE with green TB in parallel bars ; 1x10 quickly, 2x5 slow and controlled with 3s hold  patient noting intermittent sensation of buckling   Vaso:  Rt LE on wedge for 10 minutes with medium compression at 34deg     PATIENT EDUCATION:  09/22/2023 Education details: HEP update Person educated: Patient Education method: Programmer, multimedia, Demonstration, Verbal cues, and Handouts Education comprehension: verbalized understanding, returned demonstration, and verbal cues required  HOME EXERCISE PROGRAM: Access Code: [email or web address] URL: [email or web address] Date: 10/20/2023 Prepared by: Grayce Spatz   Exercises - Supine Heel Slide  - 3-5 x daily - 7 x weekly - 1 sets - 10 reps - 5 hold - Seated Long Arc Quad  - 3-5 x daily - 7 x weekly - 1 sets - 5-10 reps - 2 hold - Supine Knee Extension Mobilization with Weight  - 4-5 x daily - 7 x weekly - 1 sets - 1 reps - to tolerance up to 5 mins hold - Supine Quadricep Sets  - 3-5 x daily - 7 x weekly - 1 sets - 10 reps  - 5 hold - Seated Quad Set  - 3-5 x daily - 7 x weekly - 1 sets - 10 reps - 5 hold - Seated Straight Leg Heel Taps  - 1-2 x daily - 7 x weekly - 3 sets - 10 reps - Standing Terminal Knee Extension with Resistance  - 1 x daily - 7 x weekly - 2-3 sets - 10 reps - 3-5 seconds hold - Braided Sidestepping  - 1 x daily - 7 x weekly - 4-5 sets - 5-7 reps - Tandem Stance  - 1 x daily - 7 x weekly - 4 sets - 2 reps - 30 seconds hold  ASSESSMENT:  CLINICAL IMPRESSION:  ***Quadriceps strength and endurance remains the focus with [facility] home and clinic program.  He reports instability, weakness and increased pain/soreness after his 8+ hour shift and after several days in a row standing at work.  Chair notes he is about to start a 12-hour shift next week and he has concerns about how his body will hold up with this, given the soreness and instability after an 8-hour day.  Amando has obvious quadriceps atrophy.  Combined with the instability, Venetia will certainly benefit from continuing supervised strength and flexibility work to make him more  comfortable with return to his normal work activities without fear of his knee giving out or additional joint injury.     OBJECTIVE IMPAIRMENTS: Abnormal gait, decreased activity tolerance, decreased balance, decreased coordination, decreased endurance, decreased mobility, difficulty walking, decreased ROM, decreased strength, hypomobility, increased edema, increased fascial restrictions, impaired perceived functional ability, increased muscle spasms, impaired flexibility, improper body mechanics, and pain.   ACTIVITY LIMITATIONS: carrying, lifting, bending, sitting, standing, squatting, sleeping, stairs, transfers, bed mobility, and locomotion level  PARTICIPATION LIMITATIONS: meal prep, cleaning, laundry, interpersonal relationship, driving, shopping, community activity, occupation, and yard work  PERSONAL FACTORS: Anxiety, HTN are also affecting patient's functional  outcome.   REHAB POTENTIAL: Good  CLINICAL DECISION MAKING: Stable/uncomplicated  EVALUATION COMPLEXITY: Low   GOALS: Goals reviewed with patient? Yes  SHORT TERM GOALS: (target date for Short term goals are 3 weeks 09/15/2023)   1.  Patient will demonstrate independent use of home exercise program to maintain progress from in clinic treatments.  Goal status: Met 10/07/2023  LONG TERM GOALS: (target dates for all long term goals are 10 weeks  11/03/2023 )***   1. Patient will demonstrate/report pain at worst less than or equal to 2/10 to facilitate minimal limitation in daily activity secondary to pain symptoms.  Goal status: Ongoing   11/18/2023   2. Patient will demonstrate independent use of home exercise program to facilitate ability to maintain/progress functional gains from skilled physical therapy services.  Goal status: Ongoing   11/18/2023   3. Patient will demonstrate Patient specific functional scale avg > or = 8/10 to indicate reduced disability due to condition.   Goal status: Ongoing   11/14/2023   4.  Patient will demonstrate Rt LE MMT 5/5, dynamometry within 15 % of Lt for knee extension to faciltiate usual transfers, stairs, squatting at PLOF for daily life.   (No testing on eval for dynamometry due to surgical protocol)  Goal status: Ongoing   11/01/2023   5.  Patient will demonstrate independent ambulation > 500 ft for community integration at [facility].  Goal status:   Ongoing   11/18/2023   6.  Patient will demonstrate ascending/descending stairs reciprocally s UE assist for community integration.   Goal status: Ongoing   11/18/2023   7.  Patient will demonstrate SLS bilateral > 30 seconds for stability in ambualtion.  Goal Status: Ongoing   11/18/2023   PLAN:  PT FREQUENCY: 2x/week  PT DURATION: 4 weeks  PLANNED INTERVENTIONS: Can include 02853- PT Re-evaluation, 97110-Therapeutic exercises, 97530- Therapeutic activity, 97112- Neuromuscular re-education,  97535- Self Care, 97140- Manual therapy, [phone]- Gait training, [phone]- Orthotic Fit/training, [phone]- Canalith repositioning, [id number]- Aquatic Therapy, [phone]- Electrical stimulation (unattended), [phone]- Electrical stimulation (manual), [id number] Physical performance testing, 97016- Vasopneumatic device, [id number]- Ultrasound, [id number]- Traction (mechanical), [id number]- Ionotophoresis 4mg /ml Dexamethasone , Patient/Family education, Balance training, Stair training, Taping, Dry Needling, Joint mobilization, Joint manipulation, Spinal manipulation, Spinal mobilization, Scar mobilization, Vestibular training, Visual/preceptual remediation/compensation, DME instructions, Cryotherapy, and Moist heat.  All performed as medically necessary.  All included unless contraindicated  PLAN FOR NEXT SESSION: ***Progressive quadriceps strengthening, be mindful about pain symptoms.  Balance/WB acceptance improvements to help improve ambulation control.  Consider adding a gentle prone or supine quadriceps stretch.     Burnard Meth, PT 12/04/23  4:36 PM

## 2023-12-05 ENCOUNTER — Encounter

## 2023-12-15 ENCOUNTER — Encounter: Payer: Self-pay | Admitting: Physical Therapy

## 2023-12-15 ENCOUNTER — Ambulatory Visit (INDEPENDENT_AMBULATORY_CARE_PROVIDER_SITE_OTHER): Admitting: Physical Therapy

## 2023-12-15 DIAGNOSIS — R262 Difficulty in walking, not elsewhere classified: Secondary | ICD-10-CM | POA: Diagnosis not present

## 2023-12-15 DIAGNOSIS — M25661 Stiffness of right knee, not elsewhere classified: Secondary | ICD-10-CM

## 2023-12-15 DIAGNOSIS — M6281 Muscle weakness (generalized): Secondary | ICD-10-CM | POA: Diagnosis not present

## 2023-12-15 DIAGNOSIS — M25561 Pain in right knee: Secondary | ICD-10-CM | POA: Diagnosis not present

## 2023-12-15 DIAGNOSIS — R600 Localized edema: Secondary | ICD-10-CM

## 2023-12-15 NOTE — Therapy (Signed)
 OUTPATIENT PHYSICAL THERAPY TREATMENT Patient Name: Dale Fitzgerald MRN: [id number] DOB:13-Apr-1985, 38 y.o., male Today's Date: 12/15/2023  END OF SESSION:  PT End of Session - 12/15/23 0801     Visit Number 16    Number of Visits 20    Date for Recertification  12/16/23    Authorization Type [employer] $40 copay    PT Start Time 0801    PT Stop Time 0831   pt requested to leave early to get son to school   PT Time Calculation (min) 30 min    Activity Tolerance Patient limited by pain    Behavior During Therapy [facility] for tasks assessed/performed                 Past Medical History:  Diagnosis Date   Anxiety    Hypertension    Past Surgical History:  Procedure Laterality Date   APPENDECTOMY     Patient Active Problem List   Diagnosis Date Noted   Patellar tendon rupture, right, initial encounter 07/12/2023   Depression     PCP: No provider listed in epic.   REFERRING PROVIDER: Jule Ronal LITTIE DEVONNA  REFERRING DIAG: [id number] (ICD-10-CM) - Patellar tendon rupture, right, initial encounter  THERAPY DIAG:  Acute pain of right knee  Muscle weakness (generalized)  Stiffness of right knee, not elsewhere classified  Difficulty in walking, not elsewhere classified  Localized edema  Rationale for Evaluation and Treatment: Rehabilitation  ONSET DATE: Surgery 07/21/2023  SUBJECTIVE:   SUBJECTIVE STATEMENT:  They took him back out of work; still having some days that are worse than others   PERTINENT HISTORY: Right patella tendon rupture 07/11/2023 with surgery 07/21/2023. Had been wearing hinge brace 0-30 degrees prior to 08/23/2023 PA visit.  Plan for follow up in 4 weeks for brace unlocking to 90 deg.  Currently set from 0-60.    PAIN:  NPRS scale: Brief 5/10 with instability, otherwise 2-3/10 Pain location: Rt knee  Pain description: Mostly tightness but can be sharp with instability Aggravating factors: Weight-bearing and end range for flexion Relieving factors:  Rest, exercises  PRECAUTIONS: Knee - No in house protocol directly provided.  Respected protocol from [facility] in folder bin in clinic.  10/05/23: per Doctors orders, okay for out of brace PROM as tolerated   WEIGHT BEARING RESTRICTIONS: Brace 0-90 per PA visit on 09/20/2023   FALLS:  Has patient fallen in last 6 months? Yes. Number of falls 1 - current injury  LIVING ENVIRONMENT: Lives in: House/apartment Stairs: Flight of stairs in house.    Outside small step for porch without rail Has following equipment at home: Bilateral crutches, locking stairs.   OCCUPATION: Has worked 3 jobs. 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   PLOF: Independent, basketball recreationally, playing sports with son.    PATIENT GOALS: Reduce pain, get back to work/life. Get stronger  OBJECTIVE:   PATIENT SURVEYS:  Patient-Specific Activity Scoring Scheme  0 represents "unable to perform." 10 represents "able to perform at prior level. 0 1 2 3 4 5 6 7 8 9  10 (Date and Score)   Activity Eval  08/25/2023  11/01/2023  1. Walking independently  0 5   2. Stairs  4  4  3. work 0 4  4.Recreational sport 0 0  5.    Score 1 avg 2.75 avg   Total score = sum of the activity scores/number of activities Minimum detectable  change (90%CI) for average score = 2 points Minimum detectable change (90%CI) for single activity score = 3 points  COGNITION: 08/25/2023 Overall cognitive status: WFL    SENSATION: 08/25/2023 WFL  EDEMA:  08/25/2023 Localized edema Rt knee  MUSCLE LENGTH: 08/25/2023 No specific testing.   POSTURE:  08/25/2023 Unremarkable   PALPATION: 08/25/2023 Mild tenderness around incision.   LOWER EXTREMITY ROM:   ROM Right Eval 08/25/2023 Left Eval 08/25/2023 Right 09/15/2023 Right  10/05/2023 Right 10/12/2023  Right 11/01/2023  Knee flexion 60 AROM in supine heel slide with pain   P: 92 in supine with pain   AROM in supine heel slide 92   Knee extension -16 AROM in seated LAQ  -9 in supine heel prop  -5 in seated LAQ AROM Rt -4 in supine with heel prop 0 in heel prop 0 AROM in leg raise seated   (Blank rows = not tested)  LOWER EXTREMITY MMT:  MMT Right Eval 08/25/2023 Left Eval 08/25/2023 Right 09/19/2023   Hip flexion 3+/5 5/5 5/5   Hip extension      Hip abduction      Hip adduction      Hip internal rotation      Hip external rotation      Knee flexion 4/5 5/5 4/5   Knee extension NT 5/5 3+/5   Ankle dorsiflexion 5/5  5/5   Ankle plantarflexion      Ankle inversion      Ankle eversion       (Blank rows = not tested)  LOWER EXTREMITY SPECIAL TESTS:  08/25/2023 No specific testing.   FUNCTIONAL TESTS:  08/25/2023 18 inch chair transfer: Unable unassist  Lt SLS: not tested Rt SLS: unable   GAIT: 11/01/2023: Independent ambulation, no brace.  Deviations noted in stance on Rt leg, toe off progression.   09/22/2023: Single axillary crutch, brace 0-90 deg.   09/15/2023: One axillary crutch with brace 0-60 deg.   08/25/2023 Hinge brace 0-60 degrees with bilateral axillary crutches.  Step to pattern primarily with partial WB noted on Rt leg. Associated deviations due to brace locking at 60 deg (hip circumduction, hike etc)  BFR Borderline cuff 4 or 5.  Selected 5 for assessment. LOP in supine 216 mmHg                                                                                                                                                                       TODAY'S TREATMENT: 12/15/23 TherEx Recumbent bike L5 x 8 min Knee extension 20# bil concentric; RLE only eccentric 3x10  TherAct Leg Press bil 100# 3x10  TRX split squats 2x10 bil   11/18/2023 Recumbent bike Seat 11 Level 5 for 4 minutes Quadriceps sets with pillows  under right knee 2 sets of 10 for 5 seconds Attempted seated straight leg raises, stopped due to pain (3 straight days of standing at work for 8 hours a day) Knee extension  machine 90 to 30 degrees, bilateral and down the right side only with slow eccentric 15#15 reps  Neuromuscular reeducation: Tandem balance in parallel bars with eyes open; head turning and eyes closed 2 x 20 seconds each  Functional Activities: Hip hike in parallel bar with knees unlocked (for standing endurance and to encourage avoiding hyperextension with gait) 10 x 3 seconds   11/01/2023 Therex: Recumbent bike seat 11 10 mins lvl 3 with intervals for :10 seconds at top of each minute from min 2 to min 8.  Seated quad set with SLR x 6 Rt leg Continued cues on quad set use at home with progression to SLR as able.   Neuro Re-ed Standing small pball TKE Rt leg at wall 5 sec hold x 10  SLS with contralateral leg reactive blazepod touching 30 sec x 3 bilateral in // bars with occasional HHA  Theractivity Leg press double leg 87 lbs x 15 with slow lowering focus Leg press single leg 43 lbs 2 x 15 bilateral with slow lowering focus  TRX strap assisted squat above chair x 20   10/28/2023 TherEx:  Recumbent bike seat 11, level 3 for 4 minutes, level 5 for 4 minutes  Leg press  Bilat LEs: 87# 1x15, 1x15 at a slower pace with focus on controlling knee extension  Rt LE: highly painful this date, discontinued after 2 reps Bilat  LE concentric with R LE only eccentric, 2x10 with verbal cues for decreasing ROM secondary to increased pain RDLs with 20# KB 1x10; discontinued secondary to pain   Neuro Re-Ed:  Tandem stance with reactive balance control while catching and tossing ball in parallel bars 3x30s each leg in back  Intermittent UE use due to minimal lateral LOB  TKE with green TB in parallel bars ; 1x10 quickly, 2x5 slow and controlled with 3s hold  patient noting intermittent sensation of buckling   Vaso:  Rt LE on wedge for 10 minutes with medium compression at 34deg     PATIENT EDUCATION:  09/22/2023 Education details: HEP update Person educated: Patient Education method:  Programmer, multimedia, Demonstration, Verbal cues, and Handouts Education comprehension: verbalized understanding, returned demonstration, and verbal cues required  HOME EXERCISE PROGRAM: Access Code: [email or web address] URL: [email or web address] Date: 10/20/2023 Prepared by: Grayce Spatz   Exercises - Supine Heel Slide  - 3-5 x daily - 7 x weekly - 1 sets - 10 reps - 5 hold - Seated Long Arc Quad  - 3-5 x daily - 7 x weekly - 1 sets - 5-10 reps - 2 hold - Supine Knee Extension Mobilization with Weight  - 4-5 x daily - 7 x weekly - 1 sets - 1 reps - to tolerance up to 5 mins hold - Supine Quadricep Sets  - 3-5 x daily - 7 x weekly - 1 sets - 10 reps - 5 hold - Seated Quad Set  - 3-5 x daily - 7 x weekly - 1 sets - 10 reps - 5 hold - Seated Straight Leg Heel Taps  - 1-2 x daily - 7 x weekly - 3 sets - 10 reps - Standing Terminal Knee Extension with Resistance  - 1 x daily - 7 x weekly - 2-3 sets - 10 reps - 3-5 seconds hold - Braided Sidestepping  - 1 x daily -  7 x weekly - 4-5 sets - 5-7 reps - Tandem Stance  - 1 x daily - 7 x weekly - 4 sets - 2 reps - 30 seconds hold  ASSESSMENT:  CLINICAL IMPRESSION:  Pt tolerated session well today, needed to leave early to get his son to school.  Has difficulty with single leg closed chain activities.  Will continue to benefit from PT to maximize function.   OBJECTIVE IMPAIRMENTS: Abnormal gait, decreased activity tolerance, decreased balance, decreased coordination, decreased endurance, decreased mobility, difficulty walking, decreased ROM, decreased strength, hypomobility, increased edema, increased fascial restrictions, impaired perceived functional ability, increased muscle spasms, impaired flexibility, improper body mechanics, and pain.   ACTIVITY LIMITATIONS: carrying, lifting, bending, sitting, standing, squatting, sleeping, stairs, transfers, bed mobility, and locomotion level  PARTICIPATION LIMITATIONS: meal prep, cleaning, laundry,  interpersonal relationship, driving, shopping, community activity, occupation, and yard work  PERSONAL FACTORS: Anxiety, HTN are also affecting patient's functional outcome.   REHAB POTENTIAL: Good  CLINICAL DECISION MAKING: Stable/uncomplicated  EVALUATION COMPLEXITY: Low   GOALS: Goals reviewed with patient? Yes  SHORT TERM GOALS: (target date for Short term goals are 3 weeks 09/15/2023)   1.  Patient will demonstrate independent use of home exercise program to maintain progress from in clinic treatments.  Goal status: Met 10/07/2023  LONG TERM GOALS: (target date 12/16/23)   1. Patient will demonstrate/report pain at worst less than or equal to 2/10 to facilitate minimal limitation in daily activity secondary to pain symptoms.  Goal status: Ongoing   11/18/2023   2. Patient will demonstrate independent use of home exercise program to facilitate ability to maintain/progress functional gains from skilled physical therapy services.  Goal status: Ongoing   11/18/2023   3. Patient will demonstrate Patient specific functional scale avg > or = 8/10 to indicate reduced disability due to condition.   Goal status: Ongoing   11/14/2023   4.  Patient will demonstrate Rt LE MMT 5/5, dynamometry within 15 % of Lt for knee extension to faciltiate usual transfers, stairs, squatting at PLOF for daily life.   (No testing on eval for dynamometry due to surgical protocol)  Goal status: Ongoing   11/01/2023   5.  Patient will demonstrate independent ambulation > 500 ft for community integration at [facility].  Goal status:   Ongoing   11/18/2023   6.  Patient will demonstrate ascending/descending stairs reciprocally s UE assist for community integration.   Goal status: Ongoing   11/18/2023   7.  Patient will demonstrate SLS bilateral > 30 seconds for stability in ambualtion.  Goal Status: Ongoing   11/18/2023   PLAN:  PT FREQUENCY: 2x/week  PT DURATION: 4 weeks  PLANNED INTERVENTIONS: Can  include 02853- PT Re-evaluation, 97110-Therapeutic exercises, 97530- Therapeutic activity, 97112- Neuromuscular re-education, 97535- Self Care, 97140- Manual therapy, [phone]- Gait training, [phone]- Orthotic Fit/training, [phone]- Canalith repositioning, [id number]- Aquatic Therapy, [phone]- Electrical stimulation (unattended), [phone]- Electrical stimulation (manual), [id number] Physical performance testing, 97016- Vasopneumatic device, [id number]- Ultrasound, [id number]- Traction (mechanical), [id number]- Ionotophoresis 4mg /ml Dexamethasone , Patient/Family education, Balance training, Stair training, Taping, Dry Needling, Joint mobilization, Joint manipulation, Spinal manipulation, Spinal mobilization, Scar mobilization, Vestibular training, Visual/preceptual remediation/compensation, DME instructions, Cryotherapy, and Moist heat.  All performed as medically necessary.  All included unless contraindicated  PLAN FOR NEXT SESSION: will need additional recert; Progressive quadriceps strengthening, be mindful about pain symptoms.  Balance/WB acceptance improvements to help improve ambulation control.  Consider adding a gentle prone or supine quadriceps stretch.    Corean JULIANNA Ku, PT,  DPT 12/15/23 8:33 AM

## 2023-12-18 NOTE — Therapy (Signed)
 OUTPATIENT PHYSICAL THERAPY TREATMENT/RECERTIFICATION Patient Name: Dale Fitzgerald MRN: [id number] DOB:03-27-1985, 38 y.o., male Today's Date: 12/19/2023  END OF SESSION:  PT End of Session - 12/19/23 1224     Visit Number 17    Number of Visits 30    Date for Recertification  01/30/24    Authorization Type [employer] $40 copay    PT Start Time 1105    PT Stop Time 1145    PT Time Calculation (min) 40 min    Activity Tolerance Patient tolerated treatment well    Behavior During Therapy WFL for tasks assessed/performed                  Past Medical History:  Diagnosis Date   Anxiety    Hypertension    Past Surgical History:  Procedure Laterality Date   APPENDECTOMY     Patient Active Problem List   Diagnosis Date Noted   Patellar tendon rupture, right, initial encounter 07/12/2023   Depression     PCP: No provider listed in epic.   REFERRING PROVIDER: Jule Ronal LITTIE DEVONNA  REFERRING DIAG: [phone] (ICD-10-CM) - Patellar tendon rupture, right, initial encounter  THERAPY DIAG:  Acute pain of right knee  Muscle weakness (generalized)  Stiffness of right knee, not elsewhere classified  Difficulty in walking, not elsewhere classified  Localized edema  Rationale for Evaluation and Treatment: Rehabilitation  ONSET DATE: Surgery 07/21/2023  SUBJECTIVE:   SUBJECTIVE STATEMENT:  Pt reports some anterior knee pain with walking over weekend.  PERTINENT HISTORY: Right patella tendon rupture 07/11/2023 with surgery 07/21/2023. Had been wearing hinge brace 0-30 degrees prior to 08/23/2023 PA visit.  Plan for follow up in 4 weeks for brace unlocking to 90 deg.  Currently set from 0-60.    PAIN:  NPRS scale: 3/10 Pain location: Rt knee  Pain description: Mostly tightness but can be sharp with instability Aggravating factors: Weight-bearing and end range for flexion Relieving factors: Rest, exercises  PRECAUTIONS: Knee - No in house protocol directly provided.   Respected protocol from [facility] in folder bin in clinic.  10/05/23: per Doctors orders, okay for out of brace PROM as tolerated   WEIGHT BEARING RESTRICTIONS: Brace 0-90 per PA visit on 09/20/2023   FALLS:  Has patient fallen in last 6 months? Yes. Number of falls 1 - current injury  LIVING ENVIRONMENT: Lives in: House/apartment Stairs: Flight of stairs in house.    Outside small step for porch without rail Has following equipment at home: Bilateral crutches, locking stairs.   OCCUPATION: Has worked 3 jobs. 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   PLOF: Independent, basketball recreationally, playing sports with son.    PATIENT GOALS: Reduce pain, get back to work/life. Get stronger  OBJECTIVE:   PATIENT SURVEYS:  Patient-Specific Activity Scoring Scheme  0 represents "unable to perform." 10 represents "able to perform at prior level. 0 1 2 3 4 5 6 7 8 9  10 (Date and Score)   Activity Eval  08/25/2023  11/01/2023 12/19/23  1. Walking independently  0 5  6  2. Stairs  4  4 4   3. work 0 4 0  4.Recreational sport 0 0 0  5.     Score 1 avg 2.75 avg 2.5   Total score = sum of the activity scores/number of activities Minimum detectable change (90%CI) for average score = 2 points Minimum detectable change (90%CI) for single activity  score = 3 points  COGNITION: 08/25/2023 Overall cognitive status: WFL    SENSATION: 08/25/2023 WFL  EDEMA:  08/25/2023 Localized edema Rt knee  MUSCLE LENGTH: 08/25/2023 No specific testing.   POSTURE:  08/25/2023 Unremarkable   PALPATION: 08/25/2023 Mild tenderness around incision.   LOWER EXTREMITY ROM:   ROM Right Eval 08/25/2023 Left Eval 08/25/2023 Right 09/15/2023 Right  10/05/2023 Right 10/12/2023  Right 11/01/2023 Right  12/19/23  Knee flexion 60 AROM in supine heel slide with pain   P: 92 in supine with pain   AROM in supine heel slide 92 95  Knee extension -16 AROM in seated  LAQ  -9 in supine heel prop  -5 in seated LAQ AROM Rt -4 in supine with heel prop 0 in heel prop 0 AROM in leg raise seated 0   (Blank rows = not tested)  LOWER EXTREMITY MMT:  MMT Right Eval 08/25/2023 Left Eval 08/25/2023 Right 09/19/2023 Right 12/19/23  Hip flexion 3+/5 5/5 5/5   Hip extension      Hip abduction      Hip adduction      Hip internal rotation      Hip external rotation      Knee flexion 4/5 5/5 4/5 4+  Knee extension NT 5/5 3+/5 4-  Ankle dorsiflexion 5/5  5/5   Ankle plantarflexion      Ankle inversion      Ankle eversion       (Blank rows = not tested)  LOWER EXTREMITY SPECIAL TESTS:  08/25/2023 No specific testing.   FUNCTIONAL TESTS:  08/25/2023 18 inch chair transfer: Unable unassist  Lt SLS: not tested Rt SLS: unable   GAIT: 11/01/2023: Independent ambulation, no brace.  Deviations noted in stance on Rt leg, toe off progression.   09/22/2023: Single axillary crutch, brace 0-90 deg.   09/15/2023: One axillary crutch with brace 0-60 deg.   08/25/2023 Hinge brace 0-60 degrees with bilateral axillary crutches.  Step to pattern primarily with partial WB noted on Rt leg. Associated deviations due to brace locking at 60 deg (hip circumduction, hike etc)  BFR Borderline cuff 4 or 5.  Selected 5 for assessment. LOP in supine 216 mmHg                                                                                                                                                                       TODAY'S TREATMENT: R Knee 12/19/23 TherEx Recumbent bike L5 x 8 min LAQ 2x10 R 3# TherAct Leg Press bil 100# 3x10  TRX squats, curtseys 2x10 bil Neuro  tandem stance 30 sec 3x3  SLS 30 sec 3x Vaso:  Rt LE on wedge for 10 minutes with medium compression at 34deg  12/15/23 TherEx Recumbent bike L5 x 8 min Knee extension 20# bil concentric; RLE only eccentric 3x10  TherAct Leg Press bil 100# 3x10  TRX split squats 2x10 bil   11/18/2023 Recumbent  bike Seat 11 Level 5 for 4 minutes Quadriceps sets with pillows under right knee 2 sets of 10 for 5 seconds Attempted seated straight leg raises, stopped due to pain (3 straight days of standing at work for 8 hours a day) Knee extension machine 90 to 30 degrees, bilateral and down the right side only with slow eccentric 15#15 reps  Neuromuscular reeducation: Tandem balance in parallel bars with eyes open; head turning and eyes closed 2 x 20 seconds each  Functional Activities: Hip hike in parallel bar with knees unlocked (for standing endurance and to encourage avoiding hyperextension with gait) 10 x 3 seconds   11/01/2023 Therex: Recumbent bike seat 11 10 mins lvl 3 with intervals for :10 seconds at top of each minute from min 2 to min 8.  Seated quad set with SLR x 6 Rt leg Continued cues on quad set use at home with progression to SLR as able.   Neuro Re-ed Standing small pball TKE Rt leg at wall 5 sec hold x 10  SLS with contralateral leg reactive blazepod touching 30 sec x 3 bilateral in // bars with occasional HHA  Theractivity Leg press double leg 87 lbs x 15 with slow lowering focus Leg press single leg 43 lbs 2 x 15 bilateral with slow lowering focus  TRX strap assisted squat above chair x 20   10/28/2023 TherEx:  Recumbent bike seat 11, level 3 for 4 minutes, level 5 for 4 minutes  Leg press  Bilat LEs: 87# 1x15, 1x15 at a slower pace with focus on controlling knee extension  Rt LE: highly painful this date, discontinued after 2 reps Bilat  LE concentric with R LE only eccentric, 2x10 with verbal cues for decreasing ROM secondary to increased pain RDLs with 20# KB 1x10; discontinued secondary to pain   Neuro Re-Ed:  Tandem stance with reactive balance control while catching and tossing ball in parallel bars 3x30s each leg in back  Intermittent UE use due to minimal lateral LOB  TKE with green TB in parallel bars ; 1x10 quickly, 2x5 slow and controlled with 3s hold   patient noting intermittent sensation of buckling   Vaso:  Rt LE on wedge for 10 minutes with medium compression at 34deg     PATIENT EDUCATION:  09/22/2023 Education details: HEP update Person educated: Patient Education method: Programmer, multimedia, Demonstration, Verbal cues, and Handouts Education comprehension: verbalized understanding, returned demonstration, and verbal cues required  HOME EXERCISE PROGRAM: Access Code: [email or web address] URL: [email or web address] Date: 10/20/2023 Prepared by: Grayce Spatz   Exercises - Supine Heel Slide  - 3-5 x daily - 7 x weekly - 1 sets - 10 reps - 5 hold - Seated Long Arc Quad  - 3-5 x daily - 7 x weekly - 1 sets - 5-10 reps - 2 hold - Supine Knee Extension Mobilization with Weight  - 4-5 x daily - 7 x weekly - 1 sets - 1 reps - to tolerance up to 5 mins hold - Supine Quadricep Sets  - 3-5 x daily - 7 x weekly - 1 sets - 10 reps - 5 hold - Seated Quad Set  - 3-5 x daily - 7 x weekly - 1 sets - 10 reps - 5 hold - Seated Straight Leg Heel Taps  - 1-2  x daily - 7 x weekly - 3 sets - 10 reps - Standing Terminal Knee Extension with Resistance  - 1 x daily - 7 x weekly - 2-3 sets - 10 reps - 3-5 seconds hold - Braided Sidestepping  - 1 x daily - 7 x weekly - 4-5 sets - 5-7 reps - Tandem Stance  - 1 x daily - 7 x weekly - 4 sets - 2 reps - 30 seconds hold  ASSESSMENT:  CLINICAL IMPRESSION:  Pt has had  17 visits post op and has progressed in all phases ( ROM, strength, gait)  but is hampered by pain control.  He would benefit from continued skilled PT to complete LTG.    OBJECTIVE IMPAIRMENTS: Abnormal gait, decreased activity tolerance, decreased balance, decreased coordination, decreased endurance, decreased mobility, difficulty walking, decreased ROM, decreased strength, hypomobility, increased edema, increased fascial restrictions, impaired perceived functional ability, increased muscle spasms, impaired flexibility, improper body mechanics,  and pain.   ACTIVITY LIMITATIONS: carrying, lifting, bending, sitting, standing, squatting, sleeping, stairs, transfers, bed mobility, and locomotion level  PARTICIPATION LIMITATIONS: meal prep, cleaning, laundry, interpersonal relationship, driving, shopping, community activity, occupation, and yard work  PERSONAL FACTORS: Anxiety, HTN are also affecting patient's functional outcome.   REHAB POTENTIAL: Good  CLINICAL DECISION MAKING: Stable/uncomplicated  EVALUATION COMPLEXITY: Low   GOALS: Goals reviewed with patient? Yes  SHORT TERM GOALS: (target date for Short term goals are 3 weeks 09/15/2023)   1.  Patient will demonstrate independent use of home exercise program to maintain progress from in clinic treatments.  Goal status: Met 10/07/2023  LONG TERM GOALS: Target 01/30/2024     1. Patient will demonstrate/report pain at worst less than or equal to 2/10 to facilitate minimal limitation in daily activity secondary to pain symptoms.  Goal status: Ongoing   12/19/23   2. Patient will demonstrate independent use of home exercise program to facilitate ability to maintain/progress functional gains from skilled physical therapy services.  Goal status: Ongoing 12/19/23   3. Patient will demonstrate Patient specific functional scale avg > or = 8/10 to indicate reduced disability due to condition.   Goal status: Ongoing   12/19/23   4.  Patient will demonstrate Rt LE MMT 5/5, dynamometry within 15 % of Lt for knee extension to faciltiate usual transfers, stairs, squatting at PLOF for daily life.   (No testing on eval for dynamometry due to surgical protocol)  Goal status: Ongoing  12/19/23   5.  Patient will demonstrate independent ambulation > 500 ft for community integration at [facility].  Goal status: MET 12/19/23  6.  Patient will demonstrate ascending/descending stairs reciprocally s UE assist for community integration.   Goal status: Ongoing 12/19/23   7.  Patient will  demonstrate SLS bilateral > 30 seconds for stability in ambualtion.  Goal Status: Ongoing   11/18/2023   PLAN:  PT FREQUENCY: 1-2x week  PT DURATION: 5-6 weeks  PLANNED INTERVENTIONS: Can include 02853- PT Re-evaluation, 97110-Therapeutic exercises, 97530- Therapeutic activity, 97112- Neuromuscular re-education, 97535- Self Care, 97140- Manual therapy, [phone]- Gait training, [phone]- Orthotic Fit/training, [phone]- Canalith repositioning, [id number]- Aquatic Therapy, [phone]- Electrical stimulation (unattended), [phone]- Electrical stimulation (manual), [id number] Physical performance testing, 97016- Vasopneumatic device, [id number]- Ultrasound, [id number]- Traction (mechanical), [id number]- Ionotophoresis 4mg /ml Dexamethasone , Patient/Family education, Balance training, Stair training, Taping, Dry Needling, Joint mobilization, Joint manipulation, Spinal manipulation, Spinal mobilization, Scar mobilization, Vestibular training, Visual/preceptual remediation/compensation, DME instructions, Cryotherapy, and Moist heat.  All performed as medically necessary.  All included unless contraindicated  PLAN FOR NEXT SESSION: Progressive quadriceps strengthening, be mindful about pain symptoms.  Balance/WB acceptance improvements to help improve ambulation control.  Consider adding a gentle prone or supine quadriceps stretch.    Burnard Meth, PT 12/19/23  12:31 PM

## 2023-12-19 ENCOUNTER — Ambulatory Visit

## 2023-12-19 DIAGNOSIS — M6281 Muscle weakness (generalized): Secondary | ICD-10-CM | POA: Diagnosis not present

## 2023-12-19 DIAGNOSIS — M25561 Pain in right knee: Secondary | ICD-10-CM

## 2023-12-19 DIAGNOSIS — R262 Difficulty in walking, not elsewhere classified: Secondary | ICD-10-CM | POA: Diagnosis not present

## 2023-12-19 DIAGNOSIS — M25661 Stiffness of right knee, not elsewhere classified: Secondary | ICD-10-CM

## 2023-12-19 DIAGNOSIS — R600 Localized edema: Secondary | ICD-10-CM

## 2023-12-20 ENCOUNTER — Ambulatory Visit: Admitting: Physician Assistant

## 2023-12-20 DIAGNOSIS — S86811A Strain of other muscle(s) and tendon(s) at lower leg level, right leg, initial encounter: Secondary | ICD-10-CM | POA: Diagnosis not present

## 2023-12-20 NOTE — Progress Notes (Signed)
   Post-Op Visit Note   Patient: Dale Fitzgerald           Date of Birth: June 20, 1985           MRN: [id number] Visit Date: 12/20/2023 PCP: Patient, No Pcp Per   Assessment & Plan:  Chief Complaint:  Chief Complaint  Patient presents with   Right Knee - Pain   Visit Diagnoses:  1. Patellar tendon rupture, right, initial encounter     Plan: Patient is a 38 year old gentleman who comes in today approximately 5 months status post right knee patellar tendon repair 07/21/2023.  He still notes some pain, although he has somewhat improved.  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  He is unable to return to work as of yet as he has to work 12-hour shifts and has to be able to work full duty without restrictions and does not feel he is at a point to be able to do this at this time.  Examination of his right knee reveals range of motion from 0 to 125 degrees.  4 out of 5 strength.  He is neurovascularly intact distally.  At this point, he will continue working on strengthening exercises.  He will follow-up at his regularly scheduled appointment to determine if he is ready to return to work at that time.  Follow-Up Instructions: Return for f/u at already scheduled appt.   Orders:  No orders of the defined types were placed in this encounter.  No orders of the defined types were placed in this encounter.   Imaging: No new imaging  PMFS History: Patient Active Problem List   Diagnosis Date Noted   Patellar tendon rupture, right, initial encounter 07/12/2023   Depression    Past Medical History:  Diagnosis Date   Anxiety    Hypertension     No family history on file.  Past Surgical History:  Procedure Laterality Date   APPENDECTOMY     Social History   Occupational History   Not on file  Tobacco Use   Smoking status: Every Day    Current packs/day: 0.50    Average packs/day: 0.5 packs/day for 10.0 years (5.0 ttl pk-yrs)    Types: Cigarettes   Smokeless tobacco: Not  on file  Vaping Use   Vaping status: Never Used  Substance and Sexual Activity   Alcohol use: Yes   Drug use: Yes    Types: Marijuana   Sexual activity: Not on file

## 2023-12-26 ENCOUNTER — Encounter: Admitting: Rehabilitative and Restorative Service Providers"

## 2023-12-26 NOTE — Therapy (Incomplete)
 OUTPATIENT PHYSICAL THERAPY TREATMENT Patient Name: Dale Fitzgerald MRN: [id number] DOB:02-24-1986, 38 y.o., male Today's Date: 12/26/2023  END OF SESSION:            Past Medical History:  Diagnosis Date   Anxiety    Hypertension    Past Surgical History:  Procedure Laterality Date   APPENDECTOMY     Patient Active Problem List   Diagnosis Date Noted   Patellar tendon rupture, right, initial encounter 07/12/2023   Depression     PCP: No provider listed in epic.   REFERRING PROVIDER: Jule Ronal LITTIE DEVONNA  REFERRING DIAG: [phone] (ICD-10-CM) - Patellar tendon rupture, right, initial encounter  THERAPY DIAG:  No diagnosis found.  Rationale for Evaluation and Treatment: Rehabilitation  ONSET DATE: Surgery 07/21/2023  SUBJECTIVE:   SUBJECTIVE STATEMENT:  Pt reports some anterior knee pain with walking over weekend.  PERTINENT HISTORY: Right patella tendon rupture 07/11/2023 with surgery 07/21/2023. Had been wearing hinge brace 0-30 degrees prior to 08/23/2023 PA visit.  Plan for follow up in 4 weeks for brace unlocking to 90 deg.  Currently set from 0-60.    PAIN:  NPRS scale: 3/10 Pain location: Rt knee  Pain description: Mostly tightness but can be sharp with instability Aggravating factors: Weight-bearing and end range for flexion Relieving factors: Rest, exercises  PRECAUTIONS: Knee - No in house protocol directly provided.  Respected protocol from [facility] in folder bin in clinic.  10/05/23: per Doctors orders, okay for out of brace PROM as tolerated   WEIGHT BEARING RESTRICTIONS: Brace 0-90 per PA visit on 09/20/2023   FALLS:  Has patient fallen in last 6 months? Yes. Number of falls 1 - current injury  LIVING ENVIRONMENT: Lives in: House/apartment Stairs: Flight of stairs in house.    Outside small step for porch without rail Has following equipment at home: Bilateral crutches, locking stairs.   OCCUPATION: Has worked 3 jobs. Main job as Designer, industrial/product at [employer]  and G with standing.  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   PLOF: Independent, basketball recreationally, playing sports with son.    PATIENT GOALS: Reduce pain, get back to work/life. Get stronger  OBJECTIVE:   PATIENT SURVEYS:  Patient-Specific Activity Scoring Scheme  0 represents "unable to perform." 10 represents "able to perform at prior level. 0 1 2 3 4 5 6 7 8 9  10 (Date and Score)   Activity Eval  08/25/2023  11/01/2023 12/19/23  1. Walking independently  0 5  6  2. Stairs  4  4 4   3. work 0 4 0  4.Recreational sport 0 0 0  5.     Score 1 avg 2.75 avg 2.5   Total score = sum of the activity scores/number of activities Minimum detectable change (90%CI) for average score = 2 points Minimum detectable change (90%CI) for single activity score = 3 points  COGNITION: 08/25/2023 Overall cognitive status: WFL    SENSATION: 08/25/2023 WFL  EDEMA:  08/25/2023 Localized edema Rt knee  MUSCLE LENGTH: 08/25/2023 No specific testing.   POSTURE:  08/25/2023 Unremarkable   PALPATION: 08/25/2023 Mild tenderness around incision.   LOWER EXTREMITY ROM:   ROM Right Eval 08/25/2023 Left Eval 08/25/2023 Right 09/15/2023 Right  10/05/2023 Right 10/12/2023  Right 11/01/2023 Right  12/19/23  Knee flexion 60 AROM in supine heel slide with pain   P: 92 in supine with pain   AROM in supine heel slide 92 95  Knee extension -16 AROM in seated LAQ  -  9 in supine heel prop  -5 in seated LAQ AROM Rt -4 in supine with heel prop 0 in heel prop 0 AROM in leg raise seated 0   (Blank rows = not tested)  LOWER EXTREMITY MMT:  MMT Right Eval 08/25/2023 Left Eval 08/25/2023 Right 09/19/2023 Right 12/19/23 Right 12/26/2023 Left 12/26/2023  Hip flexion 3+/5 5/5 5/5     Hip extension        Hip abduction        Hip adduction        Hip internal rotation        Hip external rotation        Knee flexion 4/5 5/5 4/5 4+    Knee extension NT 5/5 3+/5 4-    Ankle dorsiflexion 5/5  5/5      Ankle plantarflexion        Ankle inversion        Ankle eversion         (Blank rows = not tested)  LOWER EXTREMITY SPECIAL TESTS:  08/25/2023 No specific testing.   FUNCTIONAL TESTS:  08/25/2023 18 inch chair transfer: Unable unassist  Lt SLS: not tested Rt SLS: unable   GAIT: 11/01/2023: Independent ambulation, no brace.  Deviations noted in stance on Rt leg, toe off progression.   09/22/2023: Single axillary crutch, brace 0-90 deg.   09/15/2023: One axillary crutch with brace 0-60 deg.   08/25/2023 Hinge brace 0-60 degrees with bilateral axillary crutches.  Step to pattern primarily with partial WB noted on Rt leg. Associated deviations due to brace locking at 60 deg (hip circumduction, hike etc)  BFR Borderline cuff 4 or 5.  Selected 5 for assessment. LOP in supine 216 mmHg                                                                                                                                                                       TODAY'S TREATMENT:      DATE: 12/26/2023 Therex:  Neuro Re-ed:   TherActivity:   Vaso:    TODAY'S TREATMENT:      DATE: 12/19/23 TherEx Recumbent bike L5 x 8 min LAQ 2x10 R 3# TherAct Leg Press bil 100# 3x10  TRX squats, curtseys 2x10 bil Neuro  tandem stance 30 sec 3x3  SLS 30 sec 3x Vaso:  Rt LE on wedge for 10 minutes with medium compression at 34deg   TODAY'S TREATMENT:      DATE 12/15/23 TherEx Recumbent bike L5 x 8 min Knee extension 20# bil concentric; RLE only eccentric 3x10  TherAct Leg Press bil 100# 3x10  TRX split squats 2x10 bil   TODAY'S TREATMENT:      DATE8/29/2025 Recumbent bike Seat 11 Level 5 for 4 minutes  Quadriceps sets with pillows under right knee 2 sets of 10 for 5 seconds Attempted seated straight leg raises, stopped due to pain (3 straight days of standing at work for 8 hours a day) Knee extension machine 90 to 30 degrees, bilateral and down the right side only with slow eccentric 15#15  reps  Neuromuscular reeducation: Tandem balance in parallel bars with eyes open; head turning and eyes closed 2 x 20 seconds each  Functional Activities: Hip hike in parallel bar with knees unlocked (for standing endurance and to encourage avoiding hyperextension with gait) 10 x 3 seconds   TODAY'S TREATMENT:      DATE8/02/2024 Therex: Recumbent bike seat 11 10 mins lvl 3 with intervals for :10 seconds at top of each minute from min 2 to min 8.  Seated quad set with SLR x 6 Rt leg Continued cues on quad set use at home with progression to SLR as able.   Neuro Re-ed Standing small pball TKE Rt leg at wall 5 sec hold x 10  SLS with contralateral leg reactive blazepod touching 30 sec x 3 bilateral in // bars with occasional HHA  Theractivity Leg press double leg 87 lbs x 15 with slow lowering focus Leg press single leg 43 lbs 2 x 15 bilateral with slow lowering focus  TRX strap assisted squat above chair x 20  PATIENT EDUCATION:  09/22/2023 Education details: HEP update Person educated: Patient Education method: Programmer, multimedia, Demonstration, Verbal cues, and Handouts Education comprehension: verbalized understanding, returned demonstration, and verbal cues required  HOME EXERCISE PROGRAM: Access Code: [email or web address] URL: [email or web address] Date: 10/20/2023 Prepared by: Grayce Spatz   Exercises - Supine Heel Slide  - 3-5 x daily - 7 x weekly - 1 sets - 10 reps - 5 hold - Seated Long Arc Quad  - 3-5 x daily - 7 x weekly - 1 sets - 5-10 reps - 2 hold - Supine Knee Extension Mobilization with Weight  - 4-5 x daily - 7 x weekly - 1 sets - 1 reps - to tolerance up to 5 mins hold - Supine Quadricep Sets  - 3-5 x daily - 7 x weekly - 1 sets - 10 reps - 5 hold - Seated Quad Set  - 3-5 x daily - 7 x weekly - 1 sets - 10 reps - 5 hold - Seated Straight Leg Heel Taps  - 1-2 x daily - 7 x weekly - 3 sets - 10 reps - Standing Terminal Knee Extension with Resistance  - 1 x daily - 7  x weekly - 2-3 sets - 10 reps - 3-5 seconds hold - Braided Sidestepping  - 1 x daily - 7 x weekly - 4-5 sets - 5-7 reps - Tandem Stance  - 1 x daily - 7 x weekly - 4 sets - 2 reps - 30 seconds hold  ASSESSMENT:  CLINICAL IMPRESSION:  ***  OBJECTIVE IMPAIRMENTS: Abnormal gait, decreased activity tolerance, decreased balance, decreased coordination, decreased endurance, decreased mobility, difficulty walking, decreased ROM, decreased strength, hypomobility, increased edema, increased fascial restrictions, impaired perceived functional ability, increased muscle spasms, impaired flexibility, improper body mechanics, and pain.   ACTIVITY LIMITATIONS: carrying, lifting, bending, sitting, standing, squatting, sleeping, stairs, transfers, bed mobility, and locomotion level  PARTICIPATION LIMITATIONS: meal prep, cleaning, laundry, interpersonal relationship, driving, shopping, community activity, occupation, and yard work  PERSONAL FACTORS: Anxiety, HTN are also affecting patient's functional outcome.   REHAB POTENTIAL: Good  CLINICAL DECISION MAKING: Stable/uncomplicated  EVALUATION COMPLEXITY: Low   GOALS:  Goals reviewed with patient? Yes  SHORT TERM GOALS: (target date for Short term goals are 3 weeks 09/15/2023)   1.  Patient will demonstrate independent use of home exercise program to maintain progress from in clinic treatments.  Goal status: Met 10/07/2023  LONG TERM GOALS: Target 01/30/2024     1. Patient will demonstrate/report pain at worst less than or equal to 2/10 to facilitate minimal limitation in daily activity secondary to pain symptoms.  Goal status: Ongoing   12/19/23   2. Patient will demonstrate independent use of home exercise program to facilitate ability to maintain/progress functional gains from skilled physical therapy services.  Goal status: Ongoing 12/19/23   3. Patient will demonstrate Patient specific functional scale avg > or = 8/10 to indicate reduced  disability due to condition.   Goal status: Ongoing   12/19/23   4.  Patient will demonstrate Rt LE MMT 5/5, dynamometry within 15 % of Lt for knee extension to faciltiate usual transfers, stairs, squatting at PLOF for daily life.   (No testing on eval for dynamometry due to surgical protocol)  Goal status: Ongoing  12/19/23   5.  Patient will demonstrate independent ambulation > 500 ft for community integration at [facility].  Goal status: MET 12/19/23  6.  Patient will demonstrate ascending/descending stairs reciprocally s UE assist for community integration.   Goal status: Ongoing 12/19/23   7.  Patient will demonstrate SLS bilateral > 30 seconds for stability in ambualtion.  Goal Status: Ongoing   11/18/2023   PLAN:  PT FREQUENCY: 1-2x week  PT DURATION: 5-6 weeks  PLANNED INTERVENTIONS: Can include 02853- PT Re-evaluation, 97110-Therapeutic exercises, 97530- Therapeutic activity, 97112- Neuromuscular re-education, 97535- Self Care, 97140- Manual therapy, [phone]- Gait training, [phone]- Orthotic Fit/training, [phone]- Canalith repositioning, [id number]- Aquatic Therapy, [phone]- Electrical stimulation (unattended), [phone]- Electrical stimulation (manual), [id number] Physical performance testing, 97016- Vasopneumatic device, [id number]- Ultrasound, [id number]- Traction (mechanical), [id number]- Ionotophoresis 4mg /ml Dexamethasone , Patient/Family education, Balance training, Stair training, Taping, Dry Needling, Joint mobilization, Joint manipulation, Spinal manipulation, Spinal mobilization, Scar mobilization, Vestibular training, Visual/preceptual remediation/compensation, DME instructions, Cryotherapy, and Moist heat.  All performed as medically necessary.  All included unless contraindicated  PLAN FOR NEXT SESSION: Flexion mobility gains, quad strengthening.    No specific protocol limitations due to length of time since surgery.    Ozell Silvan, PT, DPT, OCS, ATC 12/26/23  9:19 AM

## 2024-01-03 ENCOUNTER — Ambulatory Visit: Admitting: Physician Assistant

## 2024-01-06 ENCOUNTER — Ambulatory Visit: Admitting: Orthopaedic Surgery

## 2024-01-06 DIAGNOSIS — S86811A Strain of other muscle(s) and tendon(s) at lower leg level, right leg, initial encounter: Secondary | ICD-10-CM

## 2024-01-06 NOTE — Progress Notes (Signed)
 Office Visit Note   Patient: Dale Fitzgerald           Date of Birth: 11-13-1985           MRN: [id number] Visit Date: 01/06/2024              Requested by: No referring provider defined for this encounter. PCP: Patient, No Pcp Per   Assessment & Plan: Visit Diagnoses:  1. Patellar tendon rupture, right, initial encounter     Plan: History of Present Illness Dale Fitzgerald is a 38 year old male who presents check status post patellar tendon repair 5 months ago.  Currently out of work.  No light duty is available per the patient.  He experiences knee buckling and a sensation of 'cluttered' feeling in the knee, causing pain and discomfort. He is actively participating in physical therapy. Increased physical activity, particularly walking on the treadmill, leads to knee swelling and irritation after 10 to 13 minutes. 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 Attempts to return to work are hindered by knee irritation during a 10-minute walk from the parking lot to his station. He remains on short-term disability due to these ongoing issues.  Physical Exam MUSCULOSKELETAL: Knee scar healed, full extension. Knee straight leg raise intact. Knee flexion intact. Knee range of motion 0-100 degrees.  Assessment and Plan Status post right patellar tendon repair Five months post-surgery with ongoing recovery. Reports knee buckling, congestion, pain, and swelling post-activity. Range of motion 0 to 100 degrees. Healing progressing well.  Maximum medical improvement expected at one year. Weight management discussed to aid recovery. On short-term disability due to knee instability and work demands. - Continue physical therapy. - Encourage weight management. - Keep out of work for four months. - Schedule follow-up in four months to reassess recovery and work readiness.  Follow-Up Instructions: Return in about 4 months (around 05/08/2024).   Orders:  No  orders of the defined types were placed in this encounter.  No orders of the defined types were placed in this encounter.     Procedures: No procedures performed   Clinical Data: No additional findings.   Subjective: Chief Complaint  Patient presents with   Right Knee - Pain    HPI  Review of Systems  Constitutional: Negative.   HENT: Negative.    Eyes: Negative.   Respiratory: Negative.    Cardiovascular: Negative.   Gastrointestinal: Negative.   Endocrine: Negative.   Genitourinary: Negative.   Skin: Negative.   Allergic/Immunologic: Negative.   Neurological: Negative.   Hematological: Negative.   Psychiatric/Behavioral: Negative.    All other systems reviewed and are negative.    Objective: Vital Signs: There were no vitals taken for this visit.  Physical Exam Vitals and nursing note reviewed.  Constitutional:      Appearance: He is well-developed.  HENT:     Head: Normocephalic and atraumatic.  Eyes:     Pupils: Pupils are equal, round, and reactive to light.  Pulmonary:     Effort: Pulmonary effort is normal.  Abdominal:     Palpations: Abdomen is soft.  Musculoskeletal:        General: Normal range of motion.     Cervical back: Neck supple.  Skin:    General: Skin is warm.  Neurological:     Mental Status: He is alert and oriented to person, place, and time.  Psychiatric:        Behavior: Behavior  normal.        Thought Content: Thought content normal.        Judgment: Judgment normal.     Ortho Exam  Specialty Comments:  No specialty comments available.  Imaging: No results found.   PMFS History: Patient Active Problem List   Diagnosis Date Noted   Patellar tendon rupture, right, initial encounter 07/12/2023   Depression    Past Medical History:  Diagnosis Date   Anxiety    Hypertension     No family history on file.  Past Surgical History:  Procedure Laterality Date   APPENDECTOMY     Social History   Occupational  History   Not on file  Tobacco Use   Smoking status: Every Day    Current packs/day: 0.50    Average packs/day: 0.5 packs/day for 10.0 years (5.0 ttl pk-yrs)    Types: Cigarettes   Smokeless tobacco: Not on file  Vaping Use   Vaping status: Never Used  Substance and Sexual Activity   Alcohol use: Yes   Drug use: Yes    Types: Marijuana   Sexual activity: Not on file

## 2024-01-16 ENCOUNTER — Telehealth: Payer: Self-pay | Admitting: Orthopaedic Surgery

## 2024-01-16 NOTE — Telephone Encounter (Signed)
 Patient would like to come back for PT. Would need a new referral for PT in epic.

## 2024-01-16 NOTE — Telephone Encounter (Signed)
 Yes thanks

## 2024-01-16 NOTE — Telephone Encounter (Signed)
OK to enter

## 2024-01-17 ENCOUNTER — Other Ambulatory Visit: Payer: Self-pay

## 2024-01-17 DIAGNOSIS — S86811D Strain of other muscle(s) and tendon(s) at lower leg level, right leg, subsequent encounter: Secondary | ICD-10-CM

## 2024-01-17 NOTE — Telephone Encounter (Signed)
New order placed in chart.

## 2024-01-18 NOTE — Therapy (Signed)
 OUTPATIENT PHYSICAL THERAPY TREATMENT/RECERTIFICATION Patient Name: Dale Fitzgerald MRN: [id number] DOB:[birth year], 38 y.o., male Today's Date: 01/19/2024  END OF SESSION:  PT End of Session - 01/19/24 1536     Visit Number 18    Number of Visits 30    Date for Recertification  01/30/24    Authorization Type [employer] $40 copay    PT Start Time 1515    PT Stop Time 1555    PT Time Calculation (min) 40 min    Activity Tolerance Patient tolerated treatment well    Behavior During Therapy [facility] for tasks assessed/performed          Past Medical History:  Diagnosis Date   Anxiety    Hypertension    Past Surgical History:  Procedure Laterality Date   APPENDECTOMY     Patient Active Problem List   Diagnosis Date Noted   Patellar tendon rupture, right, initial encounter 07/12/2023   Depression     PCP: No provider listed in epic.   REFERRING PROVIDER: Jule Ronal LITTIE DEVONNA  REFERRING DIAG: [phone] (ICD-10-CM) - Patellar tendon rupture, right, initial encounter  THERAPY DIAG:  Acute pain of right knee  Muscle weakness (generalized)  Stiffness of right knee, not elsewhere classified  Difficulty in walking, not elsewhere classified  Localized edema  Rationale for Evaluation and Treatment: Rehabilitation  ONSET DATE: Surgery 07/21/2023  SUBJECTIVE:   SUBJECTIVE STATEMENT:  Pt reports some anterior knee stiffness and aching.  PERTINENT HISTORY: Right patella tendon rupture 07/11/2023 with surgery 07/21/2023. Had been wearing hinge brace 0-30 degrees prior to 08/23/2023 PA visit.  Plan for follow up in 4 weeks for brace unlocking to 90 deg.  Currently set from 0-60.    PAIN:  NPRS scale: 3/10 Pain location: Rt knee  Pain description: Mostly tightness but can be sharp with instability Aggravating factors: Weight-bearing and end range for flexion Relieving factors: Rest, exercises  PRECAUTIONS: Knee - No in house protocol directly provided.  Respected protocol from [facility]  in folder bin in clinic.  10/05/23: per Doctors orders, okay for out of brace PROM as tolerated   WEIGHT BEARING RESTRICTIONS: Brace 0-90 per PA visit on 09/20/2023   FALLS:  Has patient fallen in last 6 months? Yes. Number of falls 1 - current injury  LIVING ENVIRONMENT: Lives in: House/apartment Stairs: Flight of stairs in house.    Outside small step for porch without rail Has following equipment at home: Bilateral crutches, locking stairs.   OCCUPATION: Has worked 3 jobs. 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   PLOF: Independent, basketball recreationally, playing sports with son.    PATIENT GOALS: Reduce pain, get back to work/life. Get stronger  OBJECTIVE:   PATIENT SURVEYS:  Patient-Specific Activity Scoring Scheme  0 represents "unable to perform." 10 represents "able to perform at prior level. 0 1 2 3 4 5 6 7 8 9  10 (Date and Score)   Activity Eval  08/25/2023  11/01/2023 12/19/23  1. Walking independently  0 5  6  2. Stairs  4  4 4   3. work 0 4 0  4.Recreational sport 0 0 0  5.     Score 1 avg 2.75 avg 2.5   Total score = sum of the activity scores/number of activities Minimum detectable change (90%CI) for average score = 2 points Minimum detectable change (90%CI) for single activity score = 3 points  COGNITION: 08/25/2023 Overall cognitive status:  WFL    SENSATION: 08/25/2023 WFL  EDEMA:  08/25/2023 Localized edema Rt knee  MUSCLE LENGTH: 08/25/2023 No specific testing.   POSTURE:  08/25/2023 Unremarkable   PALPATION: 08/25/2023 Mild tenderness around incision.   LOWER EXTREMITY ROM:   ROM Right Eval 08/25/2023 Left Eval 08/25/2023 Right 09/15/2023 Right  10/05/2023 Right 10/12/2023  Right 11/01/2023 Right  12/19/23  Knee flexion 60 AROM in supine heel slide with pain   P: 92 in supine with pain   AROM in supine heel slide 92 95  Knee extension -16 AROM in seated LAQ  -9 in supine heel prop  -5  in seated LAQ AROM Rt -4 in supine with heel prop 0 in heel prop 0 AROM in leg raise seated 0   (Blank rows = not tested)  LOWER EXTREMITY MMT:  MMT Right Eval 08/25/2023 Left Eval 08/25/2023 Right 09/19/2023 Right 12/19/23  Hip flexion 3+/5 5/5 5/5   Hip extension      Hip abduction      Hip adduction      Hip internal rotation      Hip external rotation      Knee flexion 4/5 5/5 4/5 4+  Knee extension NT 5/5 3+/5 4-  Ankle dorsiflexion 5/5  5/5   Ankle plantarflexion      Ankle inversion      Ankle eversion       (Blank rows = not tested)  LOWER EXTREMITY SPECIAL TESTS:  08/25/2023 No specific testing.   FUNCTIONAL TESTS:  08/25/2023 18 inch chair transfer: Unable unassist  Lt SLS: not tested Rt SLS: unable   GAIT: 11/01/2023: Independent ambulation, no brace.  Deviations noted in stance on Rt leg, toe off progression.   09/22/2023: Single axillary crutch, brace 0-90 deg.   09/15/2023: One axillary crutch with brace 0-60 deg.   08/25/2023 Hinge brace 0-60 degrees with bilateral axillary crutches.  Step to pattern primarily with partial WB noted on Rt leg. Associated deviations due to brace locking at 60 deg (hip circumduction, hike etc)  BFR Borderline cuff 4 or 5.  Selected 5 for assessment. LOP in supine 216 mmHg                                                                                                                                                                       TODAY'S TREATMENT: R Knee 01/19/24 TherEx Recumbent bike L5 x 9 min LAQ 2x10 R 4# TherAct Leg Press bil 106# 3x10  TRX squats, curtseys 2x10 bil Neuro TRX squats on Airex 2x10 Eccentric tap downs on 2'' and then on floor   12/19/23 TherEx Recumbent bike L5 x 8 min LAQ 2x10 R 3# TherAct Leg Press bil 100# 3x10  TRX squats, curtseys  2x10 bil Neuro  tandem stance 30 sec 3x3  SLS 30 sec 3x Vaso:  Rt LE on wedge for 10 minutes with medium compression at 34deg     12/15/23 TherEx Recumbent bike L5 x 8 min Knee extension 20# bil concentric; RLE only eccentric 3x10  TherAct Leg Press bil 100# 3x10  TRX split squats 2x10 bil   11/18/2023 Recumbent bike Seat 11 Level 5 for 4 minutes Quadriceps sets with pillows under right knee 2 sets of 10 for 5 seconds Attempted seated straight leg raises, stopped due to pain (3 straight days of standing at work for 8 hours a day) Knee extension machine 90 to 30 degrees, bilateral and down the right side only with slow eccentric 15#15 reps  Neuromuscular reeducation: Tandem balance in parallel bars with eyes open; head turning and eyes closed 2 x 20 seconds each  Functional Activities: Hip hike in parallel bar with knees unlocked (for standing endurance and to encourage avoiding hyperextension with gait) 10 x 3 seconds   11/01/2023 Therex: Recumbent bike seat 11 10 mins lvl 3 with intervals for :10 seconds at top of each minute from min 2 to min 8.  Seated quad set with SLR x 6 Rt leg Continued cues on quad set use at home with progression to SLR as able.   Neuro Re-ed Standing small pball TKE Rt leg at wall 5 sec hold x 10  SLS with contralateral leg reactive blazepod touching 30 sec x 3 bilateral in // bars with occasional HHA  Theractivity Leg press double leg 87 lbs x 15 with slow lowering focus Leg press single leg 43 lbs 2 x 15 bilateral with slow lowering focus  TRX strap assisted squat above chair x 20   10/28/2023 TherEx:  Recumbent bike seat 11, level 3 for 4 minutes, level 5 for 4 minutes  Leg press  Bilat LEs: 87# 1x15, 1x15 at a slower pace with focus on controlling knee extension  Rt LE: highly painful this date, discontinued after 2 reps Bilat  LE concentric with R LE only eccentric, 2x10 with verbal cues for decreasing ROM secondary to increased pain RDLs with 20# KB 1x10; discontinued secondary to pain   Neuro Re-Ed:  Tandem stance with reactive balance control while catching  and tossing ball in parallel bars 3x30s each leg in back  Intermittent UE use due to minimal lateral LOB  TKE with green TB in parallel bars ; 1x10 quickly, 2x5 slow and controlled with 3s hold  patient noting intermittent sensation of buckling   Vaso:  Rt LE on wedge for 10 minutes with medium compression at 34deg     PATIENT EDUCATION:  09/22/2023 Education details: HEP update Person educated: Patient Education method: Programmer, Multimedia, Demonstration, Verbal cues, and Handouts Education comprehension: verbalized understanding, returned demonstration, and verbal cues required  HOME EXERCISE PROGRAM: Access Code: [email or web address] URL: [email or web address] Date: 10/20/2023 Prepared by: Grayce Spatz   Exercises - Supine Heel Slide  - 3-5 x daily - 7 x weekly - 1 sets - 10 reps - 5 hold - Seated Long Arc Quad  - 3-5 x daily - 7 x weekly - 1 sets - 5-10 reps - 2 hold - Supine Knee Extension Mobilization with Weight  - 4-5 x daily - 7 x weekly - 1 sets - 1 reps - to tolerance up to 5 mins hold - Supine Quadricep Sets  - 3-5 x daily - 7 x weekly - 1 sets - 10 reps - 5 hold -  Seated Quad Set  - 3-5 x daily - 7 x weekly - 1 sets - 10 reps - 5 hold - Seated Straight Leg Heel Taps  - 1-2 x daily - 7 x weekly - 3 sets - 10 reps - Standing Terminal Knee Extension with Resistance  - 1 x daily - 7 x weekly - 2-3 sets - 10 reps - 3-5 seconds hold - Braided Sidestepping  - 1 x daily - 7 x weekly - 4-5 sets - 5-7 reps - Tandem Stance  - 1 x daily - 7 x weekly - 4 sets - 2 reps - 30 seconds hold  ASSESSMENT:  CLINICAL IMPRESSION:  Pt tried 2 box for eccentric work but some pain so tried small taps without box. OBJECTIVE IMPAIRMENTS: Abnormal gait, decreased activity tolerance, decreased balance, decreased coordination, decreased endurance, decreased mobility, difficulty walking, decreased ROM, decreased strength, hypomobility, increased edema, increased fascial restrictions, impaired perceived  functional ability, increased muscle spasms, impaired flexibility, improper body mechanics, and pain.   ACTIVITY LIMITATIONS: carrying, lifting, bending, sitting, standing, squatting, sleeping, stairs, transfers, bed mobility, and locomotion level  PARTICIPATION LIMITATIONS: meal prep, cleaning, laundry, interpersonal relationship, driving, shopping, community activity, occupation, and yard work  PERSONAL FACTORS: Anxiety, HTN are also affecting patient's functional outcome.   REHAB POTENTIAL: Good  CLINICAL DECISION MAKING: Stable/uncomplicated  EVALUATION COMPLEXITY: Low   GOALS: Goals reviewed with patient? Yes  SHORT TERM GOALS: (target date for Short term goals are 3 weeks 09/15/2023)   1.  Patient will demonstrate independent use of home exercise program to maintain progress from in clinic treatments.  Goal status: Met 10/07/2023  LONG TERM GOALS: Target 01/30/2024     1. Patient will demonstrate/report pain at worst less than or equal to 2/10 to facilitate minimal limitation in daily activity secondary to pain symptoms.  Goal status: Ongoing   12/19/23   2. Patient will demonstrate independent use of home exercise program to facilitate ability to maintain/progress functional gains from skilled physical therapy services.  Goal status: Ongoing 12/19/23   3. Patient will demonstrate Patient specific functional scale avg > or = 8/10 to indicate reduced disability due to condition.   Goal status: Ongoing   12/19/23   4.  Patient will demonstrate Rt LE MMT 5/5, dynamometry within 15 % of Lt for knee extension to faciltiate usual transfers, stairs, squatting at PLOF for daily life.   (No testing on eval for dynamometry due to surgical protocol)  Goal status: Ongoing  12/19/23   5.  Patient will demonstrate independent ambulation > 500 ft for community integration at [facility].  Goal status: MET 12/19/23  6.  Patient will demonstrate ascending/descending stairs reciprocally s UE  assist for community integration.   Goal status: Ongoing 12/19/23   7.  Patient will demonstrate SLS bilateral > 30 seconds for stability in ambualtion.  Goal Status: Ongoing   11/18/2023   PLAN:  PT FREQUENCY: 1-2x week  PT DURATION: 5-6 weeks  PLANNED INTERVENTIONS: Can include 02853- PT Re-evaluation, 97110-Therapeutic exercises, 97530- Therapeutic activity, 97112- Neuromuscular re-education, 97535- Self Care, 97140- Manual therapy, [phone]- Gait training, [phone]- Orthotic Fit/training, [phone]- Canalith repositioning, [id number]- Aquatic Therapy, [phone]- Electrical stimulation (unattended), [phone]- Electrical stimulation (manual), [id number] Physical performance testing, 97016- Vasopneumatic device, [id number]- Ultrasound, [id number]- Traction (mechanical), [id number]- Ionotophoresis 4mg /ml Dexamethasone , Patient/Family education, Balance training, Stair training, Taping, Dry Needling, Joint mobilization, Joint manipulation, Spinal manipulation, Spinal mobilization, Scar mobilization, Vestibular training, Visual/preceptual remediation/compensation, DME instructions, Cryotherapy, and Moist heat.  All performed as medically  necessary.  All included unless contraindicated  PLAN FOR NEXT SESSION: Progressive quadriceps strengthening, be mindful about pain symptoms.  Consider adding a gentle prone or supine quadriceps stretch.    Burnard Meth, PT 01/19/24  4:44 PM

## 2024-01-19 ENCOUNTER — Ambulatory Visit

## 2024-01-19 DIAGNOSIS — M25661 Stiffness of right knee, not elsewhere classified: Secondary | ICD-10-CM

## 2024-01-19 DIAGNOSIS — R262 Difficulty in walking, not elsewhere classified: Secondary | ICD-10-CM

## 2024-01-19 DIAGNOSIS — M6281 Muscle weakness (generalized): Secondary | ICD-10-CM | POA: Diagnosis not present

## 2024-01-19 DIAGNOSIS — R600 Localized edema: Secondary | ICD-10-CM

## 2024-01-19 DIAGNOSIS — M25561 Pain in right knee: Secondary | ICD-10-CM

## 2024-01-23 ENCOUNTER — Encounter: Payer: Self-pay | Admitting: Radiology

## 2024-01-24 ENCOUNTER — Ambulatory Visit (INDEPENDENT_AMBULATORY_CARE_PROVIDER_SITE_OTHER): Admitting: Rehabilitative and Restorative Service Providers"

## 2024-01-24 ENCOUNTER — Encounter: Payer: Self-pay | Admitting: Rehabilitative and Restorative Service Providers"

## 2024-01-24 DIAGNOSIS — R262 Difficulty in walking, not elsewhere classified: Secondary | ICD-10-CM

## 2024-01-24 DIAGNOSIS — M6281 Muscle weakness (generalized): Secondary | ICD-10-CM | POA: Diagnosis not present

## 2024-01-24 DIAGNOSIS — M25561 Pain in right knee: Secondary | ICD-10-CM | POA: Diagnosis not present

## 2024-01-24 DIAGNOSIS — M25661 Stiffness of right knee, not elsewhere classified: Secondary | ICD-10-CM

## 2024-01-24 DIAGNOSIS — R600 Localized edema: Secondary | ICD-10-CM

## 2024-01-24 NOTE — Therapy (Signed)
 OUTPATIENT PHYSICAL THERAPY TREATMENT Patient Name: Dale Fitzgerald MRN: [id number] DOB:1985-05-06, 38 y.o., male Today's Date: 01/24/2024  END OF SESSION:  PT End of Session - 01/24/24 1604     Visit Number 19    Number of Visits 30    Date for Recertification  01/30/24    Authorization Type [employer] $40 copay    PT Start Time 1600    PT Stop Time 1645    PT Time Calculation (min) 45 min    Activity Tolerance Patient tolerated treatment well    Behavior During Therapy WFL for tasks assessed/performed           Past Medical History:  Diagnosis Date   Anxiety    Hypertension    Past Surgical History:  Procedure Laterality Date   APPENDECTOMY     Patient Active Problem List   Diagnosis Date Noted   Patellar tendon rupture, right, initial encounter 07/12/2023   Depression     PCP: No provider listed in epic.   REFERRING PROVIDER: Jule Ronal LITTIE DEVONNA  REFERRING DIAG: [phone] (ICD-10-CM) - Patellar tendon rupture, right, initial encounter  THERAPY DIAG:  Acute pain of right knee  Muscle weakness (generalized)  Stiffness of right knee, not elsewhere classified  Difficulty in walking, not elsewhere classified  Localized edema  Rationale for Evaluation and Treatment: Rehabilitation  ONSET DATE: Surgery 07/21/2023  SUBJECTIVE:   SUBJECTIVE STATEMENT:  Pt indicated having some tightness in knee upon arrival.  Pt indicated having some swelling feel over the weekend.    PERTINENT HISTORY: Right patella tendon rupture 07/11/2023 with surgery 07/21/2023. Had been wearing hinge brace 0-30 degrees prior to 08/23/2023 PA visit.  Plan for follow up in 4 weeks for brace unlocking to 90 deg.  Currently set from 0-60.    PAIN:  NPRS scale: at worst for last couple days : 3/10  Pain location: Rt knee  Pain description: Mostly tightness but can be sharp with instability Aggravating factors: Weight-bearing and end range for flexion Relieving factors: Rest,  exercises  PRECAUTIONS: Knee - No in house protocol directly provided.  Respected protocol from [facility] in folder bin in clinic.  10/05/23: per Doctors orders, okay for out of brace PROM as tolerated   WEIGHT BEARING RESTRICTIONS: Brace 0-90 per PA visit on 09/20/2023   FALLS:  Has patient fallen in last 6 months? Yes. Number of falls 1 - current injury  LIVING ENVIRONMENT: Lives in: House/apartment Stairs: Flight of stairs in house.    Outside small step for porch without rail Has following equipment at home: Bilateral crutches, locking stairs.   OCCUPATION: Has worked 3 jobs. 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   PLOF: Independent, basketball recreationally, playing sports with son.    PATIENT GOALS: Reduce pain, get back to work/life. Get stronger  OBJECTIVE:   PATIENT SURVEYS:  Patient-Specific Activity Scoring Scheme  0 represents "unable to perform." 10 represents "able to perform at prior level. 0 1 2 3 4 5 6 7 8 9  10 (Date and Score)   Activity Eval  08/25/2023  11/01/2023 12/19/23 01/24/2024  1. Walking independently  0 5  6 7   2. Stairs  4  4 4 5   3. work 0 4 0 0  4.Recreational sport 0 0 0 0  5.      Score 1 avg 2.75 avg 2.5 3.0   Total score = sum of the activity scores/number  of activities Minimum detectable change (90%CI) for average score = 2 points Minimum detectable change (90%CI) for single activity score = 3 points  COGNITION: 08/25/2023 Overall cognitive status: WFL    SENSATION: 08/25/2023 WFL  EDEMA:  08/25/2023 Localized edema Rt knee  MUSCLE LENGTH: 08/25/2023 No specific testing.   POSTURE:  08/25/2023 Unremarkable   PALPATION: 08/25/2023 Mild tenderness around incision.   LOWER EXTREMITY ROM:   ROM Right Eval 08/25/2023 Left Eval 08/25/2023 Right 09/15/2023 Right  10/05/2023 Right 10/12/2023  Right 11/01/2023 Right  12/19/23 Right 01/24/2024  Knee flexion 60 AROM in supine heel slide  with pain   P: 92 in supine with pain   AROM in supine heel slide 92 95 AROM in supine heel slide:  120  Knee extension -16 AROM in seated LAQ  -9 in supine heel prop  -5 in seated LAQ AROM Rt -4 in supine with heel prop 0 in heel prop 0 AROM in leg raise seated 0 AROM in seated LAQ: -5  PROM in supine heel prop:  1 deg hyperextension   (Blank rows = not tested)  LOWER EXTREMITY MMT:  MMT Right Eval 08/25/2023 Left Eval 08/25/2023 Right 09/19/2023 Right 12/19/23 Right 01/24/2024 Left 01/24/2024  Hip flexion 3+/5 5/5 5/5     Hip extension        Hip abduction        Hip adduction        Hip internal rotation        Hip external rotation        Knee flexion 4/5 5/5 4/5 4+    Knee extension NT 5/5 3+/5 4- 4+/5 24, 27 lbs 5/5 80, 81 lbs  Ankle dorsiflexion 5/5  5/5     Ankle plantarflexion        Ankle inversion        Ankle eversion         (Blank rows = not tested)  LOWER EXTREMITY SPECIAL TESTS:  08/25/2023 No specific testing.   FUNCTIONAL TESTS:  08/25/2023 18 inch chair transfer: Unable unassist  Lt SLS: not tested Rt SLS: unable   GAIT: 11/01/2023: Independent ambulation, no brace.  Deviations noted in stance on Rt leg, toe off progression.   09/22/2023: Single axillary crutch, brace 0-90 deg.   09/15/2023: One axillary crutch with brace 0-60 deg.   08/25/2023 Hinge brace 0-60 degrees with bilateral axillary crutches.  Step to pattern primarily with partial WB noted on Rt leg. Associated deviations due to brace locking at 60 deg (hip circumduction, hike etc)  BFR Borderline cuff 4 or 5.  Selected 5 for assessment. LOP in supine 216 mmHg  TODAY'S TREATMENT      DATE: 01/24/2024 Therex: Recumbent bike Lvl 5 6 mins for ROM Supine bilateral SAQ 5 sec hold x 15  Attempted knee extension machine 10 lbs  for eccentric lowering but moderate pain levels noted.  Switched to seated LAQ with cuff weight and BFR  HEP update and education with handout provided with continued focus on extension gains, quad strengthening.  Education of updated provided during vaso use.   With BFR at 160 mmHg Seated Rt leg LAQ 3 lb weight     Vaso:  Rt LE on wedge for 10 minutes with medium compression at 34deg    TODAY'S TREATMENT      DATE: 01/19/24 TherEx Recumbent bike L5 x 9 min LAQ 2x10 R 4# TherAct Leg Press bil 106# 3x10  TRX squats, curtseys 2x10 bil Neuro TRX squats on Airex 2x10 Eccentric tap downs on 2'' and then on floor   TODAY'S TREATMENT      DATE9/29/25 TherEx Recumbent bike L5 x 8 min LAQ 2x10 R 3# TherAct Leg Press bil 100# 3x10  TRX squats, curtseys 2x10 bil Neuro  tandem stance 30 sec 3x3  SLS 30 sec 3x Vaso:  Rt LE on wedge for 10 minutes with medium compression at 34deg    TODAY'S TREATMENT      DATE9/25/25 TherEx Recumbent bike L5 x 8 min Knee extension 20# bil concentric; RLE only eccentric 3x10  TherAct Leg Press bil 100# 3x10  TRX split squats 2x10 bil   11/18/2023 Recumbent bike Seat 11 Level 5 for 4 minutes Quadriceps sets with pillows under right knee 2 sets of 10 for 5 seconds Attempted seated straight leg raises, stopped due to pain (3 straight days of standing at work for 8 hours a day) Knee extension machine 90 to 30 degrees, bilateral and down the right side only with slow eccentric 15#15 reps  Neuromuscular reeducation: Tandem balance in parallel bars with eyes open; head turning and eyes closed 2 x 20 seconds each  Functional Activities: Hip hike in parallel bar with knees unlocked (for standing endurance and to encourage avoiding hyperextension with gait) 10 x 3 seconds   11/01/2023 Therex: Recumbent bike seat 11 10 mins lvl 3 with intervals for :10 seconds at top of each minute from min 2 to min 8.  Seated quad set with SLR x 6 Rt  leg Continued cues on quad set use at home with progression to SLR as able.   Neuro Re-ed Standing small pball TKE Rt leg at wall 5 sec hold x 10  SLS with contralateral leg reactive blazepod touching 30 sec x 3 bilateral in // bars with occasional HHA  Theractivity Leg press double leg 87 lbs x 15 with slow lowering focus Leg press single leg 43 lbs 2 x 15 bilateral with slow lowering focus  TRX strap assisted squat above chair x 20     PATIENT EDUCATION:  09/22/2023 Education details: HEP update Person educated: Patient Education method: Programmer, Multimedia, Demonstration, Verbal cues, and Handouts Education comprehension: verbalized understanding, returned demonstration, and verbal cues required  HOME EXERCISE PROGRAM: Access Code: [email or web address] URL: [email or web address] Date: 01/24/2024 Prepared by: Ozell Silvan  Exercises - Supine Heel Slide  - 1-2 x daily - 7 x weekly - 1 sets - 10 reps - 5 hold - Supine Knee Extension Strengthening (Mirrored)  - 1-2 x daily - 7 x weekly - 1 sets - 10 reps - 5 hold - Prone Quadriceps Set  - 1-2 x daily -  7 x weekly - 1 sets - 10 reps - 5 hold - Seated Quad Set  - 3-5 x daily - 7 x weekly - 1 sets - 10 reps - 5 hold - Seated SLR  - 1-2 x daily - 7 x weekly - 1-2 sets - 10-15 reps - 2 hold - Braided Sidestepping  - 1 x daily - 7 x weekly - 4-5 sets - 5-7 reps - Tandem Stance  - 1 x daily - 7 x weekly - 4 sets - 2 reps - 30 seconds hold - Mini Squat with Counter Support and Chair Behind You  - 1 x daily - 5 x weekly - 2-3 sets - 10 reps - 3-5 seconds hold - Single Leg Squat with Chair Touch  - 1 x daily - 7 x weekly - 2-3 sets - 8-15 reps - Lunge Matrix on Slider  - 1 x daily - 5 x weekly - 2-3 sets - 5 reps  ASSESSMENT:  CLINICAL IMPRESSION:  Updated information showed improving Rt knee flexion range.  Active extension/TKE still lacking compared to Lt.  Strength dynamometry showed continued large gap in strength Rt quad to Lt quad.   Continued skilled PT services indicated with HEP to help address impairments.    OBJECTIVE IMPAIRMENTS: Abnormal gait, decreased activity tolerance, decreased balance, decreased coordination, decreased endurance, decreased mobility, difficulty walking, decreased ROM, decreased strength, hypomobility, increased edema, increased fascial restrictions, impaired perceived functional ability, increased muscle spasms, impaired flexibility, improper body mechanics, and pain.   ACTIVITY LIMITATIONS: carrying, lifting, bending, sitting, standing, squatting, sleeping, stairs, transfers, bed mobility, and locomotion level  PARTICIPATION LIMITATIONS: meal prep, cleaning, laundry, interpersonal relationship, driving, shopping, community activity, occupation, and yard work  PERSONAL FACTORS: Anxiety, HTN are also affecting patient's functional outcome.   REHAB POTENTIAL: Good  CLINICAL DECISION MAKING: Stable/uncomplicated  EVALUATION COMPLEXITY: Low   GOALS: Goals reviewed with patient? Yes  SHORT TERM GOALS: (target date for Short term goals are 3 weeks 09/15/2023)   1.  Patient will demonstrate independent use of home exercise program to maintain progress from in clinic treatments.  Goal status: Met 10/07/2023  LONG TERM GOALS: Target 01/30/2024     1. Patient will demonstrate/report pain at worst less than or equal to 2/10 to facilitate minimal limitation in daily activity secondary to pain symptoms.  Goal status: on going 01/24/2024   2. Patient will demonstrate independent use of home exercise program to facilitate ability to maintain/progress functional gains from skilled physical therapy services.  Goal status: on going 01/24/2024   3. Patient will demonstrate Patient specific functional scale avg > or = 8/10 to indicate reduced disability due to condition.   Goal status: on going 01/24/2024   4.  Patient will demonstrate Rt LE MMT 5/5, dynamometry within 15 % of Lt for knee extension  to faciltiate usual transfers, stairs, squatting at PLOF for daily life.   (No testing on eval for dynamometry due to surgical protocol)  Goal status: on going 01/24/2024   5.  Patient will demonstrate independent ambulation > 500 ft for community integration at [facility].  Goal status: MET 12/19/23  6.  Patient will demonstrate ascending/descending stairs reciprocally s UE assist for community integration.   Goal status:on going 01/24/2024   7.  Patient will demonstrate SLS bilateral > 30 seconds for stability in ambualtion.  Goal Status: on going 01/24/2024   PLAN:  PT FREQUENCY: 1-2x week  PT DURATION: 5-6 weeks  PLANNED INTERVENTIONS: Can include 02853- PT  Re-evaluation, 97110-Therapeutic exercises, 97530- Therapeutic activity, [id number]- Neuromuscular re-education, [phone]- Self Care, 02859- Manual therapy, [phone]- Gait training, [id number]- Orthotic Fit/training, [phone]- Canalith repositioning, [id number]- Aquatic Therapy, [phone]- Electrical stimulation (unattended), [phone]- Electrical stimulation (manual), [id number] Physical performance testing, [id number]- Vasopneumatic device, [id number]- Ultrasound, [id number]- Traction (mechanical), [id number]- Ionotophoresis 4mg /ml Dexamethasone , Patient/Family education, Balance training, Stair training, Taping, Dry Needling, Joint mobilization, Joint manipulation, Spinal manipulation, Spinal mobilization, Scar mobilization, Vestibular training, Visual/preceptual remediation/compensation, DME instructions, Cryotherapy, and Moist heat.  All performed as medically necessary.  All included unless contraindicated  PLAN FOR NEXT SESSION: Quad strengthening [place], [facility]   Ozell Silvan, PT, DPT, OCS, ATC 01/24/24  4:43 PM

## 2024-01-29 NOTE — Therapy (Signed)
 OUTPATIENT PHYSICAL THERAPY TREATMENT/RECERTIFICATION/PROGRESS NOTE Patient Name: Dale Fitzgerald MRN: [id number] DOB:1985/08/03, 38 y.o., male Today's Date: 01/29/2024  END OF SESSION:     Past Medical History:  Diagnosis Date   Anxiety    Hypertension    Past Surgical History:  Procedure Laterality Date   APPENDECTOMY     Patient Active Problem List   Diagnosis Date Noted   Patellar tendon rupture, right, initial encounter 07/12/2023   Depression     PCP: No provider listed in epic.   REFERRING PROVIDER: Jule Ronal LITTIE DEVONNA  REFERRING DIAG: [phone] (ICD-10-CM) - Patellar tendon rupture, right, initial encounter  THERAPY DIAG:  No diagnosis found.  Rationale for Evaluation and Treatment: Rehabilitation  ONSET DATE: Surgery 07/21/2023  SUBJECTIVE:   SUBJECTIVE STATEMENT:  ***Pt indicated having some tightness in knee upon arrival.  Pt indicated having some swelling feel over the weekend.    PERTINENT HISTORY: Right patella tendon rupture 07/11/2023 with surgery 07/21/2023. Had been wearing hinge brace 0-30 degrees prior to 08/23/2023 PA visit.  Plan for follow up in 4 weeks for brace unlocking to 90 deg.  Currently set from 0-60.    PAIN:  ***NPRS scale: at worst for last couple days : 3/10  Pain location: Rt knee  Pain description: Mostly tightness but can be sharp with instability Aggravating factors: Weight-bearing and end range for flexion Relieving factors: Rest, exercises  PRECAUTIONS: Knee - No in house protocol directly provided.  Respected protocol from [facility] in folder bin in clinic.  10/05/23: per Doctors orders, okay for out of brace PROM as tolerated   WEIGHT BEARING RESTRICTIONS: Brace 0-90 per PA visit on 09/20/2023   FALLS:  Has patient fallen in last 6 months? Yes. Number of falls 1 - current injury  LIVING ENVIRONMENT: Lives in: House/apartment Stairs: Flight of stairs in house.    Outside small step for porch without rail Has following  equipment at home: Bilateral crutches, locking stairs.   OCCUPATION: Has worked 3 jobs. 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   PLOF: Independent, basketball recreationally, playing sports with son.    PATIENT GOALS: Reduce pain, get back to work/life. Get stronger  OBJECTIVE:   PATIENT SURVEYS:  Patient-Specific Activity Scoring Scheme  0 represents "unable to perform." 10 represents "able to perform at prior level. 0 1 2 3 4 5 6 7 8 9  10 (Date and Score)   Activity Eval  08/25/2023  11/01/2023 12/19/23 01/24/2024  1. Walking independently  0 5  6 7   2. Stairs  4  4 4 5   3. work 0 4 0 0  4.Recreational sport 0 0 0 0  5.      Score 1 avg 2.75 avg 2.5 3.0   Total score = sum of the activity scores/number of activities Minimum detectable change (90%CI) for average score = 2 points Minimum detectable change (90%CI) for single activity score = 3 points  COGNITION: 08/25/2023 Overall cognitive status: WFL    SENSATION: 08/25/2023 WFL  EDEMA:  08/25/2023 Localized edema Rt knee  MUSCLE LENGTH: 08/25/2023 No specific testing.   POSTURE:  08/25/2023 Unremarkable   PALPATION: 08/25/2023 Mild tenderness around incision.   LOWER EXTREMITY ROM:   ROM Right Eval 08/25/2023 Left Eval 08/25/2023 Right 09/15/2023 Right  10/05/2023 Right 10/12/2023  Right 11/01/2023 Right  12/19/23 Right 01/24/2024  Knee flexion 60 AROM in supine heel slide with pain   P: 92  in supine with pain   AROM in supine heel slide 92 95 AROM in supine heel slide:  120  Knee extension -16 AROM in seated LAQ  -9 in supine heel prop  -5 in seated LAQ AROM Rt -4 in supine with heel prop 0 in heel prop 0 AROM in leg raise seated 0 AROM in seated LAQ: -5  PROM in supine heel prop:  1 deg hyperextension   (Blank rows = not tested)  LOWER EXTREMITY MMT:  MMT Right Eval 08/25/2023 Left Eval 08/25/2023 Right 09/19/2023 Right 12/19/23 Right 01/24/2024  Left 01/24/2024  Hip flexion 3+/5 5/5 5/5     Hip extension        Hip abduction        Hip adduction        Hip internal rotation        Hip external rotation        Knee flexion 4/5 5/5 4/5 4+    Knee extension NT 5/5 3+/5 4- 4+/5 24, 27 lbs 5/5 80, 81 lbs  Ankle dorsiflexion 5/5  5/5     Ankle plantarflexion        Ankle inversion        Ankle eversion         (Blank rows = not tested)  LOWER EXTREMITY SPECIAL TESTS:  08/25/2023 No specific testing.   FUNCTIONAL TESTS:  08/25/2023 18 inch chair transfer: Unable unassist  Lt SLS: not tested Rt SLS: unable   GAIT: 11/01/2023: Independent ambulation, no brace.  Deviations noted in stance on Rt leg, toe off progression.   09/22/2023: Single axillary crutch, brace 0-90 deg.   09/15/2023: One axillary crutch with brace 0-60 deg.   08/25/2023 Hinge brace 0-60 degrees with bilateral axillary crutches.  Step to pattern primarily with partial WB noted on Rt leg. Associated deviations due to brace locking at 60 deg (hip circumduction, hike etc)  BFR Borderline cuff 4 or 5.  Selected 5 for assessment. LOP in supine 216 mmHg                                                                                                                                                                       TODAY'S TREATMENT      DATE:  01/30/24***    01/24/2024 Therex: Recumbent bike Lvl 5 6 mins for ROM Supine bilateral SAQ 5 sec hold x 15  Attempted knee extension machine 10 lbs for eccentric lowering but moderate pain levels noted.  Switched to seated LAQ with cuff weight and BFR  HEP update and education with handout provided with continued focus on extension gains, quad strengthening.  Education of updated provided during vaso use.   With BFR at 160 mmHg Seated Rt  leg LAQ 3 lb weight     Vaso:  Rt LE on wedge for 10 minutes with medium compression at 34deg    TODAY'S TREATMENT      DATE: 01/19/24 TherEx Recumbent bike L5 x 9  min LAQ 2x10 R 4# TherAct Leg Press bil 106# 3x10  TRX squats, curtseys 2x10 bil Neuro TRX squats on Airex 2x10 Eccentric tap downs on 2'' and then on floor   TODAY'S TREATMENT      DATE9/29/25 TherEx Recumbent bike L5 x 8 min LAQ 2x10 R 3# TherAct Leg Press bil 100# 3x10  TRX squats, curtseys 2x10 bil Neuro  tandem stance 30 sec 3x3  SLS 30 sec 3x Vaso:  Rt LE on wedge for 10 minutes with medium compression at 34deg    TODAY'S TREATMENT      DATE9/25/25 TherEx Recumbent bike L5 x 8 min Knee extension 20# bil concentric; RLE only eccentric 3x10  TherAct Leg Press bil 100# 3x10  TRX split squats 2x10 bil   11/18/2023 Recumbent bike Seat 11 Level 5 for 4 minutes Quadriceps sets with pillows under right knee 2 sets of 10 for 5 seconds Attempted seated straight leg raises, stopped due to pain (3 straight days of standing at work for 8 hours a day) Knee extension machine 90 to 30 degrees, bilateral and down the right side only with slow eccentric 15#15 reps  Neuromuscular reeducation: Tandem balance in parallel bars with eyes open; head turning and eyes closed 2 x 20 seconds each  Functional Activities: Hip hike in parallel bar with knees unlocked (for standing endurance and to encourage avoiding hyperextension with gait) 10 x 3 seconds   11/01/2023 Therex: Recumbent bike seat 11 10 mins lvl 3 with intervals for :10 seconds at top of each minute from min 2 to min 8.  Seated quad set with SLR x 6 Rt leg Continued cues on quad set use at home with progression to SLR as able.   Neuro Re-ed Standing small pball TKE Rt leg at wall 5 sec hold x 10  SLS with contralateral leg reactive blazepod touching 30 sec x 3 bilateral in // bars with occasional HHA  Theractivity Leg press double leg 87 lbs x 15 with slow lowering focus Leg press single leg 43 lbs 2 x 15 bilateral with slow lowering focus  TRX strap assisted squat above chair x 20     PATIENT EDUCATION:   09/22/2023 Education details: HEP update Person educated: Patient Education method: Programmer, Multimedia, Demonstration, Verbal cues, and Handouts Education comprehension: verbalized understanding, returned demonstration, and verbal cues required  HOME EXERCISE PROGRAM: Access Code: [email or web address] URL: [email or web address] Date: 01/24/2024 Prepared by: Ozell Silvan  Exercises - Supine Heel Slide  - 1-2 x daily - 7 x weekly - 1 sets - 10 reps - 5 hold - Supine Knee Extension Strengthening (Mirrored)  - 1-2 x daily - 7 x weekly - 1 sets - 10 reps - 5 hold - Prone Quadriceps Set  - 1-2 x daily - 7 x weekly - 1 sets - 10 reps - 5 hold - Seated Quad Set  - 3-5 x daily - 7 x weekly - 1 sets - 10 reps - 5 hold - Seated SLR  - 1-2 x daily - 7 x weekly - 1-2 sets - 10-15 reps - 2 hold - Braided Sidestepping  - 1 x daily - 7 x weekly - 4-5 sets - 5-7 reps - Tandem Stance  - 1 x daily - 7  x weekly - 4 sets - 2 reps - 30 seconds hold - Mini Squat with Counter Support and Chair Behind You  - 1 x daily - 5 x weekly - 2-3 sets - 10 reps - 3-5 seconds hold - Single Leg Squat with Chair Touch  - 1 x daily - 7 x weekly - 2-3 sets - 8-15 reps - Lunge Matrix on Slider  - 1 x daily - 5 x weekly - 2-3 sets - 5 reps  ASSESSMENT:  CLINICAL IMPRESSION:  ***Updated information showed improving Rt knee flexion range.  Active extension/TKE still lacking compared to Lt.  Strength dynamometry showed continued large gap in strength Rt quad to Lt quad.  Continued skilled PT services indicated with HEP to help address impairments.    OBJECTIVE IMPAIRMENTS: Abnormal gait, decreased activity tolerance, decreased balance, decreased coordination, decreased endurance, decreased mobility, difficulty walking, decreased ROM, decreased strength, hypomobility, increased edema, increased fascial restrictions, impaired perceived functional ability, increased muscle spasms, impaired flexibility, improper body mechanics, and pain.    ACTIVITY LIMITATIONS: carrying, lifting, bending, sitting, standing, squatting, sleeping, stairs, transfers, bed mobility, and locomotion level  PARTICIPATION LIMITATIONS: meal prep, cleaning, laundry, interpersonal relationship, driving, shopping, community activity, occupation, and yard work  PERSONAL FACTORS: Anxiety, HTN are also affecting patient's functional outcome.   REHAB POTENTIAL: Good  CLINICAL DECISION MAKING: Stable/uncomplicated  EVALUATION COMPLEXITY: Low   GOALS: Goals reviewed with patient? Yes  SHORT TERM GOALS: (target date for Short term goals are 3 weeks 09/15/2023)   1.  Patient will demonstrate independent use of home exercise program to maintain progress from in clinic treatments.  Goal status: Met 10/07/2023  LONG TERM GOALS: Target 01/30/2024***     1. Patient will demonstrate/report pain at worst less than or equal to 2/10 to facilitate minimal limitation in daily activity secondary to pain symptoms.  Goal status: on going 01/24/2024   2. Patient will demonstrate independent use of home exercise program to facilitate ability to maintain/progress functional gains from skilled physical therapy services.  Goal status: on going 01/24/2024   3. Patient will demonstrate Patient specific functional scale avg > or = 8/10 to indicate reduced disability due to condition.   Goal status: on going 01/24/2024   4.  Patient will demonstrate Rt LE MMT 5/5, dynamometry within 15 % of Lt for knee extension to faciltiate usual transfers, stairs, squatting at PLOF for daily life.   (No testing on eval for dynamometry due to surgical protocol)  Goal status: on going 01/24/2024   5.  Patient will demonstrate independent ambulation > 500 ft for community integration at [facility].  Goal status: MET 12/19/23  6.  Patient will demonstrate ascending/descending stairs reciprocally s UE assist for community integration.   Goal status:on going 01/24/2024   7.  Patient will  demonstrate SLS bilateral > 30 seconds for stability in ambualtion.  Goal Status: on going 01/24/2024   PLAN:  PT FREQUENCY: 1-2x week  PT DURATION: 5-6 weeks  PLANNED INTERVENTIONS: Can include 02853- PT Re-evaluation, 97110-Therapeutic exercises, 97530- Therapeutic activity, 97112- Neuromuscular re-education, 97535- Self Care, 97140- Manual therapy, [phone]- Gait training, [phone]- Orthotic Fit/training, [phone]- Canalith repositioning, [id number]- Aquatic Therapy, [phone]- Electrical stimulation (unattended), [phone]- Electrical stimulation (manual), [id number] Physical performance testing, 97016- Vasopneumatic device, [id number]- Ultrasound, [id number]- Traction (mechanical), [id number]- Ionotophoresis 4mg /ml Dexamethasone , Patient/Family education, Balance training, Stair training, Taping, Dry Needling, Joint mobilization, Joint manipulation, Spinal manipulation, Spinal mobilization, Scar mobilization, Vestibular training, Visual/preceptual remediation/compensation, DME instructions, Cryotherapy, and Moist heat.  All performed  as medically necessary.  All included unless contraindicated  PLAN FOR NEXT SESSION: ***Quad strengthening [place], [facility]  Burnard Meth, PT 01/29/24  10:58 AM

## 2024-01-30 ENCOUNTER — Ambulatory Visit (INDEPENDENT_AMBULATORY_CARE_PROVIDER_SITE_OTHER)

## 2024-01-30 DIAGNOSIS — M6281 Muscle weakness (generalized): Secondary | ICD-10-CM

## 2024-01-30 DIAGNOSIS — R262 Difficulty in walking, not elsewhere classified: Secondary | ICD-10-CM

## 2024-01-30 DIAGNOSIS — M25661 Stiffness of right knee, not elsewhere classified: Secondary | ICD-10-CM | POA: Diagnosis not present

## 2024-01-30 DIAGNOSIS — M25561 Pain in right knee: Secondary | ICD-10-CM

## 2024-01-30 DIAGNOSIS — R600 Localized edema: Secondary | ICD-10-CM

## 2024-02-14 ENCOUNTER — Ambulatory Visit: Admitting: Rehabilitative and Restorative Service Providers"

## 2024-02-14 DIAGNOSIS — M6281 Muscle weakness (generalized): Secondary | ICD-10-CM

## 2024-02-14 DIAGNOSIS — R262 Difficulty in walking, not elsewhere classified: Secondary | ICD-10-CM

## 2024-02-14 DIAGNOSIS — M25661 Stiffness of right knee, not elsewhere classified: Secondary | ICD-10-CM | POA: Diagnosis not present

## 2024-02-14 DIAGNOSIS — R600 Localized edema: Secondary | ICD-10-CM

## 2024-02-14 DIAGNOSIS — M25561 Pain in right knee: Secondary | ICD-10-CM | POA: Diagnosis not present

## 2024-02-14 NOTE — Therapy (Signed)
 OUTPATIENT PHYSICAL THERAPY TREATMENT   Patient Name: Dale Fitzgerald MRN: [id number] DOB:1985-10-14, 38 y.o., male Today's Date: 02/14/2024  END OF SESSION:  PT End of Session - 02/14/24 1607     Visit Number 21    Number of Visits 44    Date for Recertification  03/19/24    Authorization Type [employer] $40 copay    Progress Note Due on Visit 30    PT Start Time 1600    PT Stop Time 1640    PT Time Calculation (min) 40 min    Activity Tolerance Patient tolerated treatment well    Behavior During Therapy [facility] for tasks assessed/performed             Past Medical History:  Diagnosis Date   Anxiety    Hypertension    Past Surgical History:  Procedure Laterality Date   APPENDECTOMY     Patient Active Problem List   Diagnosis Date Noted   Patellar tendon rupture, right, initial encounter 07/12/2023   Depression     PCP: No provider listed in epic.   REFERRING PROVIDER: Jule Ronal LITTIE DEVONNA  REFERRING DIAG: [phone] (ICD-10-CM) - Patellar tendon rupture, right, initial encounter  THERAPY DIAG:  Acute pain of right knee  Muscle weakness (generalized)  Stiffness of right knee, not elsewhere classified  Difficulty in walking, not elsewhere classified  Localized edema  Rationale for Evaluation and Treatment: Rehabilitation  ONSET DATE: Surgery 07/21/2023    SUBJECTIVE:   SUBJECTIVE STATEMENT:  Pt indicated feeling ok today.  Reported a little bit in the front part, achy a little.   PERTINENT HISTORY: Right patella tendon rupture 07/11/2023 with surgery 07/21/2023. Had been wearing hinge brace 0-30 degrees prior to 08/23/2023 PA visit.  Plan for follow up in 4 weeks for brace unlocking to 90 deg.  Currently set from 0-60.    PAIN:  NPRS scale: upon arrival: 3-4/10 Pain location: Rt knee anteriorly Pain description: achy Aggravating factors: Weight-bearing and end range for flexion Relieving factors: Rest, exercises  PRECAUTIONS: Knee - No in house protocol  directly provided.  Respected protocol from [facility] in folder bin in clinic.  10/05/23: per Doctors orders, okay for out of brace PROM as tolerated   WEIGHT BEARING RESTRICTIONS: Brace 0-90 per PA visit on 09/20/2023   FALLS:  Has patient fallen in last 6 months? Yes. Number of falls 1 - current injury  LIVING ENVIRONMENT: Lives in: House/apartment Stairs: Flight of stairs in house.    Outside small step for porch without rail Has following equipment at home: Bilateral crutches, locking stairs.   OCCUPATION: Has worked 3 jobs. 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   PLOF: Independent, basketball recreationally, playing sports with son.    PATIENT GOALS: Reduce pain, get back to work/life. Get stronger  OBJECTIVE:   PATIENT SURVEYS:  Patient-Specific Activity Scoring Scheme  0 represents "unable to perform." 10 represents "able to perform at prior level. 0 1 2 3 4 5 6 7 8 9  10 (Date and Score)   Activity Eval  08/25/2023  11/01/2023 12/19/23 01/24/2024 01/30/24  1. Walking independently  0 5  6 7 7   2. Stairs  4  4 4 5 5   3. work 0 4 0 0 0  4.Recreational sport 0 0 0 0 0  5.       Score 1 avg 2.75 avg 2.5 3.0 3.0   Total score =  sum of the activity scores/number of activities Minimum detectable change (90%CI) for average score = 2 points Minimum detectable change (90%CI) for single activity score = 3 points  COGNITION: 08/25/2023 Overall cognitive status: WFL    SENSATION: 08/25/2023 WFL  EDEMA:  02/14/2024: Circumferential jt line Rt: 43.5 cm Lt:  43.5 cm  08/25/2023 Localized edema Rt knee  MUSCLE LENGTH: 08/25/2023 No specific testing.   POSTURE:  08/25/2023 Unremarkable   PALPATION: 08/25/2023 Mild tenderness around incision.   LOWER EXTREMITY ROM:   ROM Right Eval 08/25/2023 Left Eval 08/25/2023 Right 09/15/2023 Right  10/05/2023 Right 10/12/2023  Right 11/01/2023 Right  12/19/23 Right 01/24/2024  Knee  flexion 60 AROM in supine heel slide with pain   P: 92 in supine with pain   AROM in supine heel slide 92 95 AROM in supine heel slide:  120  Knee extension -16 AROM in seated LAQ  -9 in supine heel prop  -5 in seated LAQ AROM Rt -4 in supine with heel prop 0 in heel prop 0 AROM in leg raise seated 0 AROM in seated LAQ: -5  PROM in supine heel prop:  1 deg hyperextension   (Blank rows = not tested)  LOWER EXTREMITY MMT:  MMT Right Eval 08/25/2023 Left Eval 08/25/2023 Right 09/19/2023 Right 12/19/23 Right 01/24/2024 Left 01/24/2024 Right 02/14/2024  Hip flexion 3+/5 5/5 5/5      Hip extension         Hip abduction         Hip adduction         Hip internal rotation         Hip external rotation         Knee flexion 4/5 5/5 4/5 4+     Knee extension NT 5/5 3+/5 4- 4+/5 24, 27 lbs 5/5 80, 81 lbs 23.4, 23.3 lbs   Ankle dorsiflexion 5/5  5/5      Ankle plantarflexion         Ankle inversion         Ankle eversion          (Blank rows = not tested)  LOWER EXTREMITY SPECIAL TESTS:  08/25/2023 No specific testing.   FUNCTIONAL TESTS:  01/30/24  Rt SLS:  31  sec  08/25/2023 18 inch chair transfer: Unable unassist  Lt SLS: not tested   GAIT: 11/01/2023: Independent ambulation, no brace.  Deviations noted in stance on Rt leg, toe off progression.   09/22/2023: Single axillary crutch, brace 0-90 deg.   09/15/2023: One axillary crutch with brace 0-60 deg.   08/25/2023 Hinge brace 0-60 degrees with bilateral axillary crutches.  Step to pattern primarily with partial WB noted on Rt leg. Associated deviations due to brace locking at 60 deg (hip circumduction, hike etc)  BFR Borderline cuff 4 or 5.  Selected 5 for assessment. LOP in supine 216 mmHg  TODAY'S TREATMENT      DATE:  02/14/2024 Therex: Recumbent bike,  seat 11 Lvl 4 8 mins for ROM Knee extension machine double leg up, Rt leg lowering slowly 5 lbs 3 x 10  Decline ramp eccentric double leg squat focus to tolerance 2 x 10 .  Focus on no anterior tibial translation.  Discussed use of TRX straps and a heel propr for loading at home. Seated quad set with SLR Rt x 15  Review of importance of loading in HEP with quad sets, SLR, and additional eccentric squat activity.    Manual Cross friction ice to patellar ligament Rt.  Education for use at home.      TODAY'S TREATMENT      DATE: 01/30/24 Leg Press bil 112 # 3x12 TRX Curtseys  4 eccentric tap downs 3x10 Recumbent bike Lvl 5 6 mins for ROM LAQ 5# 3x10  TODAY'S TREATMENT      DATE: 01/24/2024 Therex: Recumbent bike Lvl 5 6 mins for ROM Supine bilateral SAQ 5 sec hold x 15  Attempted knee extension machine 10 lbs for eccentric lowering but moderate pain levels noted.  Switched to seated LAQ with cuff weight and BFR  HEP update and education with handout provided with continued focus on extension gains, quad strengthening.  Education of updated provided during vaso use.   With BFR at 160 mmHg Seated Rt leg LAQ 3 lb weight  Vaso:  Rt LE on wedge for 10 minutes with medium compression at 34deg    TODAY'S TREATMENT      DATE: 01/19/24 TherEx Recumbent bike L5 x 9 min LAQ 2x10 R 4# TherAct Leg Press bil 106# 3x10  TRX squats, curtseys 2x10 bil Neuro TRX squats on Airex 2x10 Eccentric tap downs on 2'' and then on floor   TODAY'S TREATMENT      DATE 12/19/23 TherEx Recumbent bike L5 x 8 min LAQ 2x10 R 3# TherAct Leg Press bil 100# 3x10  TRX squats, curtseys 2x10 bil Neuro  tandem stance 30 sec 3x3  SLS 30 sec 3x Vaso:  Rt LE on wedge for 10 minutes with medium compression at 34deg     PATIENT EDUCATION:  09/22/2023 Education details: HEP update Person educated: Patient Education method: Programmer, Multimedia, Demonstration, Verbal cues, and Handouts Education comprehension:  verbalized understanding, returned demonstration, and verbal cues required  HOME EXERCISE PROGRAM: Access Code: [email or web address] URL: [email or web address] Date: 01/24/2024 Prepared by: Ozell Silvan  Exercises - Supine Heel Slide  - 1-2 x daily - 7 x weekly - 1 sets - 10 reps - 5 hold - Supine Knee Extension Strengthening (Mirrored)  - 1-2 x daily - 7 x weekly - 1 sets - 10 reps - 5 hold - Prone Quadriceps Set  - 1-2 x daily - 7 x weekly - 1 sets - 10 reps - 5 hold - Seated Quad Set  - 3-5 x daily - 7 x weekly - 1 sets - 10 reps - 5 hold - Seated SLR  - 1-2 x daily - 7 x weekly - 1-2 sets - 10-15 reps - 2 hold - Braided Sidestepping  - 1 x daily - 7 x weekly - 4-5 sets - 5-7 reps - Tandem Stance  - 1 x daily - 7 x weekly - 4 sets - 2 reps - 30 seconds hold - Mini Squat with Counter Support and Chair Behind You  - 1 x daily - 5 x weekly - 2-3 sets - 10 reps - 3-5 seconds hold -  Single Leg Squat with Chair Touch  - 1 x daily - 7 x weekly - 2-3 sets - 8-15 reps - Lunge Matrix on Slider  - 1 x daily - 5 x weekly - 2-3 sets - 5 reps  ASSESSMENT:  CLINICAL IMPRESSION:  Continued quad weakness noted in Rt with pain inhibition noted from inferior Rt patellar ligament.  Discussed importance of eccentric loading strengthening to quad and tendon as well as discussion about ice massage to impacted area.    OBJECTIVE IMPAIRMENTS: Abnormal gait, decreased activity tolerance, decreased balance, decreased coordination, decreased endurance, decreased mobility, difficulty walking, decreased ROM, decreased strength, hypomobility, increased edema, increased fascial restrictions, impaired perceived functional ability, increased muscle spasms, impaired flexibility, improper body mechanics, and pain.   ACTIVITY LIMITATIONS: carrying, lifting, bending, sitting, standing, squatting, sleeping, stairs, transfers, bed mobility, and locomotion level  PARTICIPATION LIMITATIONS: meal prep, cleaning, laundry,  interpersonal relationship, driving, shopping, community activity, occupation, and yard work  PERSONAL FACTORS: Anxiety, HTN are also affecting patient's functional outcome.   REHAB POTENTIAL: Good  CLINICAL DECISION MAKING: Stable/uncomplicated  EVALUATION COMPLEXITY: Low   GOALS: Goals reviewed with patient? Yes  SHORT TERM GOALS: (target date for Short term goals are 3 weeks 09/15/2023)   1.  Patient will demonstrate independent use of home exercise program to maintain progress from in clinic treatments.  Goal status: Met 10/07/2023  LONG TERM GOALS: 03/19/2024  7 weeks     1. Patient will demonstrate/report pain at worst less than or equal to 2/10 to facilitate minimal limitation in daily activity secondary to pain symptoms.  Goal status: on going 01/30/2024   2. Patient will demonstrate independent use of home exercise program to facilitate ability to maintain/progress functional gains from skilled physical therapy services.  Goal status: on going 01/30/2024   3. Patient will demonstrate Patient specific functional scale avg > or = 8/10 to indicate reduced disability due to condition.   Goal status: on going 01/30/2024   4.  Patient will demonstrate Rt LE MMT 5/5, dynamometry within 15 % of Lt for knee extension to faciltiate usual transfers, stairs, squatting at PLOF for daily life.   (No testing on eval for dynamometry due to surgical protocol)  Goal status: on going 01/30/2024   5.  Patient will demonstrate independent ambulation > 500 ft for community integration at [facility].  Goal status: MET 12/19/23  6.  Patient will demonstrate ascending/descending stairs reciprocally s UE assist for community integration.   Goal status:on going 01/30/2024   7.  Patient will demonstrate SLS bilateral > 30 seconds for stability in ambualtion.  Goal Status: MET 01/30/24   PLAN:  PT FREQUENCY: 1-2x week  PT DURATION: 7 weeks  PLANNED INTERVENTIONS: Can include 02853- PT  Re-evaluation, 97110-Therapeutic exercises, 97530- Therapeutic activity, 97112- Neuromuscular re-education, 97535- Self Care, 97140- Manual therapy, [phone]- Gait training, [phone]- Orthotic Fit/training, [phone]- Canalith repositioning, [id number]- Aquatic Therapy, [phone]- Electrical stimulation (unattended), [phone]- Electrical stimulation (manual), [id number] Physical performance testing, 97016- Vasopneumatic device, [id number]- Ultrasound, [id number]- Traction (mechanical), [id number]- Ionotophoresis 4mg /ml Dexamethasone , Patient/Family education, Balance training, Stair training, Taping, Dry Needling, Joint mobilization, Joint manipulation, Spinal manipulation, Spinal mobilization, Scar mobilization, Vestibular training, Visual/preceptual remediation/compensation, DME instructions, Cryotherapy, and Moist heat.  All performed as medically necessary.  All included unless contraindicated  PLAN FOR NEXT SESSION: Eccentric tendon loading as able.   Ozell Silvan, PT, DPT, OCS, ATC 02/14/24  4:42 PM

## 2024-03-01 ENCOUNTER — Ambulatory Visit: Admitting: Rehabilitative and Restorative Service Providers"

## 2024-03-01 ENCOUNTER — Encounter: Payer: Self-pay | Admitting: Rehabilitative and Restorative Service Providers"

## 2024-03-01 DIAGNOSIS — R262 Difficulty in walking, not elsewhere classified: Secondary | ICD-10-CM | POA: Diagnosis not present

## 2024-03-01 DIAGNOSIS — M25661 Stiffness of right knee, not elsewhere classified: Secondary | ICD-10-CM | POA: Diagnosis not present

## 2024-03-01 DIAGNOSIS — M25561 Pain in right knee: Secondary | ICD-10-CM | POA: Diagnosis not present

## 2024-03-01 DIAGNOSIS — R600 Localized edema: Secondary | ICD-10-CM | POA: Diagnosis not present

## 2024-03-01 DIAGNOSIS — M6281 Muscle weakness (generalized): Secondary | ICD-10-CM

## 2024-03-01 NOTE — Therapy (Signed)
 OUTPATIENT PHYSICAL THERAPY TREATMENT   Patient Name: Dale Fitzgerald MRN: [id number] DOB:[birth year], 38 y.o., male Today's Date: 03/01/2024  END OF SESSION:  PT End of Session - 03/01/24 1432     Visit Number 22    Number of Visits 44    Date for Recertification  03/19/24    Authorization Type [employer] $40 copay    Progress Note Due on Visit 30    PT Start Time 1431    PT Stop Time 1519    PT Time Calculation (min) 48 min    Activity Tolerance Patient tolerated treatment well    Behavior During Therapy WFL for tasks assessed/performed              Past Medical History:  Diagnosis Date   Anxiety    Hypertension    Past Surgical History:  Procedure Laterality Date   APPENDECTOMY     Patient Active Problem List   Diagnosis Date Noted   Patellar tendon rupture, right, initial encounter 07/12/2023   Depression     PCP: No provider listed in epic.   REFERRING PROVIDER: Jule Ronal LITTIE DEVONNA  REFERRING DIAG: [phone] (ICD-10-CM) - Patellar tendon rupture, right, initial encounter  THERAPY DIAG:  Acute pain of right knee  Muscle weakness (generalized)  Stiffness of right knee, not elsewhere classified  Difficulty in walking, not elsewhere classified  Localized edema  Rationale for Evaluation and Treatment: Rehabilitation  ONSET DATE: Surgery 07/21/2023    SUBJECTIVE:   SUBJECTIVE STATEMENT:  Dale Fitzgerald notes some increased pain over Thanksgiving related to being up on his feet a lot and weather.  He is doing his exercises and consistently gets to the gym 3-4 days a week.  PERTINENT HISTORY: Right patella tendon rupture 07/11/2023 with surgery 07/21/2023. Had been wearing hinge brace 0-30 degrees prior to 08/23/2023 PA visit.  Plan for follow up in 4 weeks for brace unlocking to 90 deg.  Currently set from 0-60.   PAIN:  NPRS scale: upon arrival: 3-6/10 this week Pain location: Rt knee anteriorly Pain description: achy Aggravating factors: Weight-bearing and end  range for flexion Relieving factors: Rest, exercises  PRECAUTIONS: Knee - No in house protocol directly provided.  Respected protocol from [facility] in folder bin in clinic.  10/05/23: per Doctors orders, okay for out of brace PROM as tolerated   WEIGHT BEARING RESTRICTIONS: Brace 0-90 per PA visit on 09/20/2023   FALLS:  Has patient fallen in last 6 months? Yes. Number of falls 1 - current injury  LIVING ENVIRONMENT: Lives in: House/apartment Stairs: Flight of stairs in house.    Outside small step for porch without rail Has following equipment at home: Bilateral crutches, locking stairs.   OCCUPATION: Has worked 3 jobs. 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   PLOF: Independent, basketball recreationally, playing sports with son.    PATIENT GOALS: Reduce pain, get back to work/life. Get stronger  OBJECTIVE:   PATIENT SURVEYS:  Patient-Specific Activity Scoring Scheme  0 represents unable to perform. 10 represents able to perform at prior level. 0 1 2 3 4 5 6 7 8 9  10 (Date and Score)   Activity Eval  08/25/2023  11/01/2023 12/19/23 01/24/2024 01/30/24  1. Walking independently  0 5  6 7 7   2. Stairs  4  4 4 5 5   3. work 0 4 0 0 0  4.Recreational sport 0 0 0 0 0  5.  Score 1 avg 2.75 avg 2.5 3.0 3.0   Total score = sum of the activity scores/number of activities Minimum detectable change (90%CI) for average score = 2 points Minimum detectable change (90%CI) for single activity score = 3 points  COGNITION: 08/25/2023 Overall cognitive status: WFL    SENSATION: 08/25/2023 WFL  EDEMA:  02/14/2024: Circumferential jt line Rt: 43.5 cm Lt:  43.5 cm  08/25/2023 Localized edema Rt knee  MUSCLE LENGTH: 08/25/2023 No specific testing.   POSTURE:  08/25/2023 Unremarkable   PALPATION: 08/25/2023 Mild tenderness around incision.   LOWER EXTREMITY ROM:   ROM Right Eval 08/25/2023 Left Eval 08/25/2023  Right 09/15/2023 Right  10/05/2023 Right 10/12/2023  Right 11/01/2023 Right  12/19/23 Right 01/24/2024 Left/Right 12//2025  Knee flexion 60 AROM in supine heel slide with pain   P: 92 in supine with pain   AROM in supine heel slide 92 95 AROM in supine heel slide:  120   Knee extension -16 AROM in seated LAQ  -9 in supine heel prop  -5 in seated LAQ AROM Rt -4 in supine with heel prop 0 in heel prop 0 AROM in leg raise seated 0 AROM in seated LAQ: -5  PROM in supine heel prop:  1 deg hyperextension   Hamstrings         50/40  Quadriceps         115/110  Heel cords         3/0   (Blank rows = not tested)  LOWER EXTREMITY MMT:  MMT Right Eval 08/25/2023 Left Eval 08/25/2023 Right 09/19/2023 Right 12/19/23 Right 01/24/2024 Left 01/24/2024 Right 02/14/2024  Hip flexion 3+/5 5/5 5/5      Hip extension         Hip abduction         Hip adduction         Hip internal rotation         Hip external rotation         Knee flexion 4/5 5/5 4/5 4+     Knee extension NT 5/5 3+/5 4- 4+/5 24, 27 lbs 5/5 80, 81 lbs 23.4, 23.3 lbs   Ankle dorsiflexion 5/5  5/5      Ankle plantarflexion         Ankle inversion         Ankle eversion          (Blank rows = not tested)  LOWER EXTREMITY SPECIAL TESTS:  08/25/2023 No specific testing.   FUNCTIONAL TESTS:  01/30/24  Rt SLS:  31  sec  08/25/2023 18 inch chair transfer: Unable unassist  Lt SLS: not tested   GAIT: 11/01/2023: Independent ambulation, no brace.  Deviations noted in stance on Rt leg, toe off progression.   09/22/2023: Single axillary crutch, brace 0-90 deg.   09/15/2023: One axillary crutch with brace 0-60 deg.   08/25/2023 Hinge brace 0-60 degrees with bilateral axillary crutches.  Step to pattern primarily with partial WB noted on Rt leg. Associated deviations due to brace locking at 60 deg (hip circumduction, hike etc)  BFR Borderline cuff 4 or 5.  Selected 5 for assessment. LOP in supine 216 mmHg  TODAY'S TREATMENT      DATE:  03/01/2024 Prone quadriceps stretch 5 x 20 seconds with belt over opposite shoulder Supine hamstrings stretch 5 x 20 seconds with belt and ankle dorsiflexion Upper heel cords stretch with slight toe in 5 x 30 seconds Knee extension machine 90 - 40, slow eccentrics 5#  2 sets of 10 Seated straight leg raises 2 sets of 10 with 2#  Functional Activities: Single Leg Press with full extension and slow eccentrics 2 sets of 15 with 50#  [id number]: Reviewed objective flexibility measures and updated HEP   TODAY'S TREATMENT      DATE:  02/14/2024 Therex: Recumbent bike, seat 11 Lvl 4 8 mins for ROM Knee extension machine double leg up, Rt leg lowering slowly 5 lbs 3 x 10  Decline ramp eccentric double leg squat focus to tolerance 2 x 10 .  Focus on no anterior tibial translation.  Discussed use of TRX straps and a heel propr for loading at home. Seated quad set with SLR Rt x 15  Review of importance of loading in HEP with quad sets, SLR, and additional eccentric squat activity.   Manual Cross friction ice to patellar ligament Rt.  Education for use at home.     TODAY'S TREATMENT      DATE: 01/30/24 Leg Press bil 112 # 3x12 TRX Curtseys  4 eccentric tap downs 3x10 Recumbent bike Lvl 5 6 mins for ROM LAQ 5# 3x10   PATIENT EDUCATION:  09/22/2023 Education details: HEP update Person educated: Patient Education method: Programmer, Multimedia, Demonstration, Verbal cues, and Handouts Education comprehension: verbalized understanding, returned demonstration, and verbal cues required  HOME EXERCISE PROGRAM: Access Code: [email or web address] URL: [email or web address] Date: 03/01/2024 Prepared by: Lamar Ivory  Exercises - Supine Heel Slide  - 1-2 x daily - 7 x weekly - 1 sets - 10 reps - 5 hold - Supine Knee Extension  Strengthening (Mirrored)  - 1-2 x daily - 7 x weekly - 1 sets - 10 reps - 5 hold - Prone Quadriceps Set  - 1-2 x daily - 7 x weekly - 1 sets - 10 reps - 5 hold - Seated Quad Set  - 3-5 x daily - 7 x weekly - 1 sets - 10 reps - 5 hold - Seated SLR  - 1-2 x daily - 7 x weekly - 1-2 sets - 10-15 reps - 2 hold - Braided Sidestepping  - 1 x daily - 7 x weekly - 4-5 sets - 5-7 reps - Tandem Stance  - 1 x daily - 7 x weekly - 4 sets - 2 reps - 30 seconds hold - Mini Squat with Counter Support and Chair Behind You  - 1 x daily - 5 x weekly - 2-3 sets - 10 reps - 3-5 seconds hold - Single Leg Squat with Chair Touch  - 1 x daily - 7 x weekly - 2-3 sets - 8-15 reps - Lunge Matrix on Slider  - 1 x daily - 5 x weekly - 2-3 sets - 5 reps - Supine Hamstring Stretch  - 2-3 x daily - 7 x weekly - 1 sets - 5 reps - 20 seconds hold - Prone Quadricep Stretch with Strap  - 2-3 x daily - 7 x weekly - 1 sets - 5 reps - 20 seconds hold - Slant Board Gastrocnemius Stretch  - 2-3 x daily - 7 x weekly - 1 sets - 5 reps - 20 seconds hold - Seated Straight Leg Raise   -  1 x daily - 7 x weekly - 10 sets - 5 reps - 2 hold  ASSESSMENT:  CLINICAL IMPRESSION:  Dale Fitzgerald has significant right sided quadriceps weakness.  I encouraged him to complete 50 seated straight leg raises daily along with 3 new stretches to assist with knee pain and instability.  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  I encouraged him to be very consistent with his rehabilitation for the best long-term outcome.   OBJECTIVE IMPAIRMENTS: Abnormal gait, decreased activity tolerance, decreased balance, decreased coordination, decreased endurance, decreased mobility, difficulty walking, decreased ROM, decreased strength, hypomobility, increased edema, increased fascial restrictions, impaired perceived functional ability, increased muscle spasms, impaired flexibility,  improper body mechanics, and pain.   ACTIVITY LIMITATIONS: carrying, lifting, bending, sitting, standing, squatting, sleeping, stairs, transfers, bed mobility, and locomotion level  PARTICIPATION LIMITATIONS: meal prep, cleaning, laundry, interpersonal relationship, driving, shopping, community activity, occupation, and yard work  PERSONAL FACTORS: Anxiety, HTN are also affecting patient's functional outcome.   REHAB POTENTIAL: Good  CLINICAL DECISION MAKING: Stable/uncomplicated  EVALUATION COMPLEXITY: Low   GOALS: Goals reviewed with patient? Yes  SHORT TERM GOALS: (target date for Short term goals are 3 weeks 09/15/2023)   1.  Patient will demonstrate independent use of home exercise program to maintain progress from in clinic treatments.  Goal status: Met 10/07/2023  LONG TERM GOALS: 03/19/2024  7 weeks     1. Patient will demonstrate/report pain at worst less than or equal to 2/10 to facilitate minimal limitation in daily activity secondary to pain symptoms.  Goal status: on going 03/01/2024   2. Patient will demonstrate independent use of home exercise program to facilitate ability to maintain/progress functional gains from skilled physical therapy services.  Goal status: on going 03/01/2024   3. Patient will demonstrate Patient specific functional scale avg > or = 8/10 to indicate reduced disability due to condition.   Goal status: on going 01/30/2024   4.  Patient will demonstrate Rt LE MMT 5/5, dynamometry within 15 % of Lt for knee extension to faciltiate usual transfers, stairs, squatting at PLOF for daily life.   (No testing on eval for dynamometry due to surgical protocol)  Goal status: on going 01/30/2024   5.  Patient will demonstrate independent ambulation > 500 ft for community integration at [facility].  Goal status: MET 12/19/23  6.  Patient will demonstrate ascending/descending stairs reciprocally s UE assist for community integration.   Goal status:on going  03/01/2024   7.  Patient will demonstrate SLS bilateral > 30 seconds for stability in ambualtion.  Goal Status: MET 01/30/24   PLAN:  PT FREQUENCY: 1-2x week  PT DURATION: 7 weeks (03/19/2024)  PLANNED INTERVENTIONS: Can include 02853- PT Re-evaluation, 97110-Therapeutic exercises, 97530- Therapeutic activity, 97112- Neuromuscular re-education, 97535- Self Care, 97140- Manual therapy, [phone]- Gait training, [phone]- Orthotic Fit/training, [phone]- Canalith repositioning, [id number]- Aquatic Therapy, [phone]- Electrical stimulation (unattended), [phone]- Electrical stimulation (manual), [id number] Physical performance testing, 97016- Vasopneumatic device, [id number]- Ultrasound, [id number]- Traction (mechanical), [id number]- Ionotophoresis 4mg /ml Dexamethasone , Patient/Family education, Balance training, Stair training, Taping, Dry Needling, Joint mobilization, Joint manipulation, Spinal manipulation, Spinal mobilization, Scar mobilization, Vestibular training, Visual/preceptual remediation/compensation, DME instructions, Cryotherapy, and Moist heat.  All performed as medically necessary.  All included unless contraindicated  PLAN FOR NEXT SESSION: Eccentric tendon loading, quadriceps strength, steps  Myer LELON Ivory PT, MPT 03/01/2024  5:23 PM

## 2024-03-08 NOTE — Therapy (Incomplete)
 OUTPATIENT PHYSICAL THERAPY TREATMENT   Patient Name: Dale Fitzgerald MRN: [id number] DOB:June 26, 1985, 38 y.o., male Today's Date: 03/08/2024  END OF SESSION:        Past Medical History:  Diagnosis Date   Anxiety    Hypertension    Past Surgical History:  Procedure Laterality Date   APPENDECTOMY     Patient Active Problem List   Diagnosis Date Noted   Patellar tendon rupture, right, initial encounter 07/12/2023   Depression     PCP: No provider listed in epic.   REFERRING PROVIDER: Jule Ronal LITTIE DEVONNA  REFERRING DIAG: [phone] (ICD-10-CM) - Patellar tendon rupture, right, initial encounter  THERAPY DIAG:  No diagnosis found.  Rationale for Evaluation and Treatment: Rehabilitation  ONSET DATE: Surgery 07/21/2023    SUBJECTIVE:   SUBJECTIVE STATEMENT:  ***Dale Fitzgerald notes some increased pain over Thanksgiving related to being up on his feet a lot and weather.  He is doing his exercises and consistently gets to the gym 3-4 days a week.  PERTINENT HISTORY: Right patella tendon rupture 07/11/2023 with surgery 07/21/2023. Had been wearing hinge brace 0-30 degrees prior to 08/23/2023 PA visit.  Plan for follow up in 4 weeks for brace unlocking to 90 deg.  Currently set from 0-60.   PAIN:  ***NPRS scale: upon arrival: 3-6/10 this week Pain location: Rt knee anteriorly Pain description: achy Aggravating factors: Weight-bearing and end range for flexion Relieving factors: Rest, exercises  PRECAUTIONS: Knee - No in house protocol directly provided.  Respected protocol from [facility] in folder bin in clinic.  10/05/23: per Doctors orders, okay for out of brace PROM as tolerated   WEIGHT BEARING RESTRICTIONS: Brace 0-90 per PA visit on 09/20/2023   FALLS:  Has patient fallen in last 6 months? Yes. Number of falls 1 - current injury  LIVING ENVIRONMENT: Lives in: House/apartment Stairs: Flight of stairs in house.    Outside small step for porch without rail Has following  equipment at home: Bilateral crutches, locking stairs.   OCCUPATION: Has worked 3 jobs. 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   PLOF: Independent, basketball recreationally, playing sports with son.    PATIENT GOALS: Reduce pain, get back to work/life. Get stronger  OBJECTIVE:   PATIENT SURVEYS:  Patient-Specific Activity Scoring Scheme  0 represents unable to perform. 10 represents able to perform at prior level. 0 1 2 3 4 5 6 7 8 9  10 (Date and Score)   Activity Eval  08/25/2023  11/01/2023 12/19/23 01/24/2024 01/30/24  1. Walking independently  0 5  6 7 7   2. Stairs  4  4 4 5 5   3. work 0 4 0 0 0  4.Recreational sport 0 0 0 0 0  5.       Score 1 avg 2.75 avg 2.5 3.0 3.0   Total score = sum of the activity scores/number of activities Minimum detectable change (90%CI) for average score = 2 points Minimum detectable change (90%CI) for single activity score = 3 points  COGNITION: 08/25/2023 Overall cognitive status: WFL    SENSATION: 08/25/2023 WFL  EDEMA:  02/14/2024: Circumferential jt line Rt: 43.5 cm Lt:  43.5 cm  08/25/2023 Localized edema Rt knee  MUSCLE LENGTH: 08/25/2023 No specific testing.   POSTURE:  08/25/2023 Unremarkable   PALPATION: 08/25/2023 Mild tenderness around incision.   LOWER EXTREMITY ROM:   ROM Right Eval 08/25/2023 Left Eval 08/25/2023 Right 09/15/2023 Right  10/05/2023 Right 10/12/2023  Right 11/01/2023 Right  12/19/23 Right 01/24/2024 Left/Right 12//2025  Knee flexion 60 AROM in supine heel slide with pain   P: 92 in supine with pain   AROM in supine heel slide 92 95 AROM in supine heel slide:  120   Knee extension -16 AROM in seated LAQ  -9 in supine heel prop  -5 in seated LAQ AROM Rt -4 in supine with heel prop 0 in heel prop 0 AROM in leg raise seated 0 AROM in seated LAQ: -5  PROM in supine heel prop:  1 deg hyperextension   Hamstrings         50/40  Quadriceps          115/110  Heel cords         3/0   (Blank rows = not tested)  LOWER EXTREMITY MMT:  MMT Right Eval 08/25/2023 Left Eval 08/25/2023 Right 09/19/2023 Right 12/19/23 Right 01/24/2024 Left 01/24/2024 Right 02/14/2024  Hip flexion 3+/5 5/5 5/5      Hip extension         Hip abduction         Hip adduction         Hip internal rotation         Hip external rotation         Knee flexion 4/5 5/5 4/5 4+     Knee extension NT 5/5 3+/5 4- 4+/5 24, 27 lbs 5/5 80, 81 lbs 23.4, 23.3 lbs   Ankle dorsiflexion 5/5  5/5      Ankle plantarflexion         Ankle inversion         Ankle eversion          (Blank rows = not tested)  LOWER EXTREMITY SPECIAL TESTS:  08/25/2023 No specific testing.   FUNCTIONAL TESTS:  01/30/24  Rt SLS:  31  sec  08/25/2023 18 inch chair transfer: Unable unassist  Lt SLS: not tested   GAIT: 11/01/2023: Independent ambulation, no brace.  Deviations noted in stance on Rt leg, toe off progression.   09/22/2023: Single axillary crutch, brace 0-90 deg.   09/15/2023: One axillary crutch with brace 0-60 deg.   08/25/2023 Hinge brace 0-60 degrees with bilateral axillary crutches.  Step to pattern primarily with partial WB noted on Rt leg. Associated deviations due to brace locking at 60 deg (hip circumduction, hike etc)  BFR Borderline cuff 4 or 5.  Selected 5 for assessment. LOP in supine 216 mmHg                                                                                                                                                                      TODAY'S TREATMENT  DATE:   03/09/24***     03/01/2024 Prone quadriceps stretch 5 x 20 seconds with belt over opposite shoulder Supine hamstrings stretch 5 x 20 seconds with belt and ankle dorsiflexion Upper heel cords stretch with slight toe in 5 x 30 seconds Knee extension machine 90 - 40, slow eccentrics 5#  2 sets of 10 Seated straight leg raises 2 sets of 10 with 2#  Functional Activities: Single  Leg Press with full extension and slow eccentrics 2 sets of 15 with 50#  [id number]: Reviewed objective flexibility measures and updated HEP   TODAY'S TREATMENT      DATE:  02/14/2024 Therex: Recumbent bike, seat 11 Lvl 4 8 mins for ROM Knee extension machine double leg up, Rt leg lowering slowly 5 lbs 3 x 10  Decline ramp eccentric double leg squat focus to tolerance 2 x 10 .  Focus on no anterior tibial translation.  Discussed use of TRX straps and a heel propr for loading at home. Seated quad set with SLR Rt x 15  Review of importance of loading in HEP with quad sets, SLR, and additional eccentric squat activity.   Manual Cross friction ice to patellar ligament Rt.  Education for use at home.     TODAY'S TREATMENT      DATE: 01/30/24 Leg Press bil 112 # 3x12 TRX Curtseys  4 eccentric tap downs 3x10 Recumbent bike Lvl 5 6 mins for ROM LAQ 5# 3x10   PATIENT EDUCATION:  09/22/2023 Education details: HEP update Person educated: Patient Education method: Programmer, Multimedia, Demonstration, Verbal cues, and Handouts Education comprehension: verbalized understanding, returned demonstration, and verbal cues required  HOME EXERCISE PROGRAM: Access Code: [email or web address] URL: [email or web address] Date: 03/01/2024 Prepared by: Lamar Ivory  Exercises - Supine Heel Slide  - 1-2 x daily - 7 x weekly - 1 sets - 10 reps - 5 hold - Supine Knee Extension Strengthening (Mirrored)  - 1-2 x daily - 7 x weekly - 1 sets - 10 reps - 5 hold - Prone Quadriceps Set  - 1-2 x daily - 7 x weekly - 1 sets - 10 reps - 5 hold - Seated Quad Set  - 3-5 x daily - 7 x weekly - 1 sets - 10 reps - 5 hold - Seated SLR  - 1-2 x daily - 7 x weekly - 1-2 sets - 10-15 reps - 2 hold - Braided Sidestepping  - 1 x daily - 7 x weekly - 4-5 sets - 5-7 reps - Tandem Stance  - 1 x daily - 7 x weekly - 4 sets - 2 reps - 30 seconds hold - Mini Squat with Counter Support and Chair Behind You  - 1 x daily - 5 x weekly - 2-3  sets - 10 reps - 3-5 seconds hold - Single Leg Squat with Chair Touch  - 1 x daily - 7 x weekly - 2-3 sets - 8-15 reps - Lunge Matrix on Slider  - 1 x daily - 5 x weekly - 2-3 sets - 5 reps - Supine Hamstring Stretch  - 2-3 x daily - 7 x weekly - 1 sets - 5 reps - 20 seconds hold - Prone Quadricep Stretch with Strap  - 2-3 x daily - 7 x weekly - 1 sets - 5 reps - 20 seconds hold - Slant Board Gastrocnemius Stretch  - 2-3 x daily - 7 x weekly - 1 sets - 5 reps - 20 seconds hold - Seated Straight Leg Raise   -  1 x daily - 7 x weekly - 10 sets - 5 reps - 2 hold  ASSESSMENT:  CLINICAL IMPRESSION:  ***Dale Fitzgerald has significant right sided quadriceps weakness.  I encouraged him to complete 50 seated straight leg raises daily along with 3 new stretches to assist with knee pain and instability.  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  I encouraged him to be very consistent with his rehabilitation for the best long-term outcome.   OBJECTIVE IMPAIRMENTS: Abnormal gait, decreased activity tolerance, decreased balance, decreased coordination, decreased endurance, decreased mobility, difficulty walking, decreased ROM, decreased strength, hypomobility, increased edema, increased fascial restrictions, impaired perceived functional ability, increased muscle spasms, impaired flexibility, improper body mechanics, and pain.   ACTIVITY LIMITATIONS: carrying, lifting, bending, sitting, standing, squatting, sleeping, stairs, transfers, bed mobility, and locomotion level  PARTICIPATION LIMITATIONS: meal prep, cleaning, laundry, interpersonal relationship, driving, shopping, community activity, occupation, and yard work  PERSONAL FACTORS: Anxiety, HTN are also affecting patient's functional outcome.   REHAB POTENTIAL: Good  CLINICAL DECISION MAKING: Stable/uncomplicated  EVALUATION COMPLEXITY: Low   GOALS: Goals  reviewed with patient? Yes  SHORT TERM GOALS: (target date for Short term goals are 3 weeks 09/15/2023)   1.  Patient will demonstrate independent use of home exercise program to maintain progress from in clinic treatments.  Goal status: Met 10/07/2023  LONG TERM GOALS: 03/19/2024  7 weeks     1. Patient will demonstrate/report pain at worst less than or equal to 2/10 to facilitate minimal limitation in daily activity secondary to pain symptoms.  Goal status: on going 03/01/2024   2. Patient will demonstrate independent use of home exercise program to facilitate ability to maintain/progress functional gains from skilled physical therapy services.  Goal status: on going 03/01/2024   3. Patient will demonstrate Patient specific functional scale avg > or = 8/10 to indicate reduced disability due to condition.   Goal status: on going 01/30/2024   4.  Patient will demonstrate Rt LE MMT 5/5, dynamometry within 15 % of Lt for knee extension to faciltiate usual transfers, stairs, squatting at PLOF for daily life.   (No testing on eval for dynamometry due to surgical protocol)  Goal status: on going 01/30/2024   5.  Patient will demonstrate independent ambulation > 500 ft for community integration at [facility].  Goal status: MET 12/19/23  6.  Patient will demonstrate ascending/descending stairs reciprocally s UE assist for community integration.   Goal status:on going 03/01/2024   7.  Patient will demonstrate SLS bilateral > 30 seconds for stability in ambualtion.  Goal Status: MET 01/30/24   PLAN:  PT FREQUENCY: 1-2x week  PT DURATION: 7 weeks (03/19/2024)  PLANNED INTERVENTIONS: Can include 02853- PT Re-evaluation, 97110-Therapeutic exercises, 97530- Therapeutic activity, 97112- Neuromuscular re-education, 97535- Self Care, 97140- Manual therapy, [phone]- Gait training, [phone]- Orthotic Fit/training, [phone]- Canalith repositioning, [id number]- Aquatic Therapy, [phone]- Electrical stimulation  (unattended), [phone]- Electrical stimulation (manual), [id number] Physical performance testing, 97016- Vasopneumatic device, [id number]- Ultrasound, [id number]- Traction (mechanical), [id number]- Ionotophoresis 4mg /ml Dexamethasone , Patient/Family education, Balance training, Stair training, Taping, Dry Needling, Joint mobilization, Joint manipulation, Spinal manipulation, Spinal mobilization, Scar mobilization, Vestibular training, Visual/preceptual remediation/compensation, DME instructions, Cryotherapy, and Moist heat.  All performed as medically necessary.  All included unless contraindicated  PLAN FOR NEXT SESSION:/*** Eccentric tendon loading, quadriceps strength, steps   Burnard Meth, PT 03/08/2024  7:53 AM

## 2024-03-09 ENCOUNTER — Encounter

## 2024-03-19 NOTE — Therapy (Signed)
 " OUTPATIENT PHYSICAL THERAPY TREATMENT/RECERT/PROGRESS   Patient Name: Dale Fitzgerald MRN: [id number] DOB:1986/02/23, 38 y.o., male Today's Date: 03/21/2024  END OF SESSION:  PT End of Session - 03/21/24 1052     Visit Number 23    Number of Visits 54    Date for Recertification  05/09/24    Authorization Type [employer] $40 copay    PT Start Time 0935    PT Stop Time 1005    PT Time Calculation (min) 30 min    Activity Tolerance Patient tolerated treatment well    Behavior During Therapy [facility] for tasks assessed/performed           Past Medical History:  Diagnosis Date   Anxiety    Hypertension    Past Surgical History:  Procedure Laterality Date   APPENDECTOMY     Patient Active Problem List   Diagnosis Date Noted   Patellar tendon rupture, right, initial encounter 07/12/2023   Depression     PCP: No provider listed in epic.   REFERRING PROVIDER: Jule Ronal LITTIE Fitzgerald  REFERRING DIAG: [phone] (ICD-10-CM) - Patellar tendon rupture, right, initial encounter  THERAPY DIAG:  Acute pain of right knee  Muscle weakness (generalized)  Stiffness of right knee, not elsewhere classified  Difficulty in walking, not elsewhere classified  Localized edema  Rationale for Evaluation and Treatment: Rehabilitation  ONSET DATE: Surgery 07/21/2023    SUBJECTIVE:   SUBJECTIVE STATEMENT:  Dale Fitzgerald notes sometimes having some pain with prolonged walking.  PERTINENT HISTORY: Right patella tendon rupture 07/11/2023 with surgery 07/21/2023. Had been wearing hinge brace 0-30 degrees prior to 08/23/2023 PA visit.  Plan for follow up in 4 weeks for brace unlocking to 90 deg.  Currently set from 0-60.   PAIN:  NPRS scale: upon arrival: 2/10  Pain location: Rt knee anteriorly Pain description: achy Aggravating factors: Weight-bearing and end range for flexion Relieving factors: Rest, exercises  PRECAUTIONS: Knee - No in house protocol directly provided.  Respected protocol from [facility] in  folder bin in clinic.  10/05/23: per Doctors orders, okay for out of brace PROM as tolerated   WEIGHT BEARING RESTRICTIONS: Brace 0-90 per PA visit on 09/20/2023   FALLS:  Has patient fallen in last 6 months? Yes. Number of falls 1 - current injury  LIVING ENVIRONMENT: Lives in: House/apartment Stairs: Flight of stairs in house.    Outside small step for porch without rail Has following equipment at home: Bilateral crutches, locking stairs.   OCCUPATION: Has worked 3 jobs. 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   PLOF: Independent, basketball recreationally, playing sports with son.    PATIENT GOALS: Reduce pain, get back to work/life. Get stronger  OBJECTIVE:   PATIENT SURVEYS:  Patient-Specific Activity Scoring Scheme  0 represents unable to perform. 10 represents able to perform at prior level. 0 1 2 3 4 5 6 7 8 9  10 (Date and Score)   Activity Eval  08/25/2023  11/01/2023 12/19/23 01/24/2024 01/30/24 03/21/24  1. Walking independently  0 5  6 7 7 9   2. Stairs  4  4 4 5 5 5   3. work 0 4 0 0 0 0  4.Recreational sport 0 0 0 0 0 0  5.        Score 1 avg 2.75 avg 2.5 3.0 3.0 3.5   Total score = sum of the activity scores/number of activities Minimum detectable change (90%CI)  for average score = 2 points Minimum detectable change (90%CI) for single activity score = 3 points  COGNITION: 08/25/2023 Overall cognitive status: WFL    SENSATION: 08/25/2023 WFL  EDEMA:  02/14/2024: Circumferential jt line Rt: 43.5 cm Lt:  43.5 cm  08/25/2023 Localized edema Rt knee  MUSCLE LENGTH: 08/25/2023 No specific testing.   POSTURE:  08/25/2023 Unremarkable   PALPATION: 08/25/2023 Mild tenderness around incision.   LOWER EXTREMITY ROM:   ROM Right Eval 08/25/2023 Left Eval 08/25/2023 Right 09/15/2023 Right  10/05/2023 Right 10/12/2023  Right 11/01/2023 Right  12/19/23 Right 01/24/2024 Right 03/21/2024  Knee flexion 60 AROM  in supine heel slide with pain   P: 92 in supine with pain   AROM in supine heel slide 92 95 AROM in supine heel slide:  120 122  Knee extension -16 AROM in seated LAQ  -9 in supine heel prop  -5 in seated LAQ AROM Rt -4 in supine with heel prop 0 in heel prop 0 AROM in leg raise seated 0 AROM in seated LAQ: -5  PROM in supine heel prop:  1 deg hyperextension 0  Hamstrings         50/40  Quadriceps         115/110  Heel cords         3/0   (Blank rows = not tested)  LOWER EXTREMITY MMT:  MMT Right Eval 08/25/2023 Left Eval 08/25/2023 Right 09/19/2023 Right 12/19/23 Right 01/24/2024 Left 01/24/2024 Right 02/14/2024 Right  02/3124  Hip flexion 3+/5 5/5 5/5       Hip extension          Hip abduction          Hip adduction          Hip internal rotation          Hip external rotation          Knee flexion 4/5 5/5 4/5 4+      Knee extension NT 5/5 3+/5 4- 4+/5 24, 27 lbs 5/5 80, 81 lbs 23.4, 23.3 lbs  R 56 cm girth at 10 cm above patella L 55    Ankle dorsiflexion 5/5  5/5       Ankle plantarflexion          Ankle inversion          Ankle eversion           (Blank rows = not tested)  LOWER EXTREMITY SPECIAL TESTS:  08/25/2023 No specific testing.   FUNCTIONAL TESTS:  01/30/24  Rt SLS:  31  sec  08/25/2023 18 inch chair transfer: Unable unassist  Lt SLS: not tested   GAIT: 11/01/2023: Independent ambulation, no brace.  Deviations noted in stance on Rt leg, toe off progression.   09/22/2023: Single axillary crutch, brace 0-90 deg.   09/15/2023: One axillary crutch with brace 0-60 deg.   08/25/2023 Hinge brace 0-60 degrees with bilateral axillary crutches.  Step to pattern primarily with partial WB noted on Rt leg. Associated deviations due to brace locking at 60 deg (hip circumduction, hike etc)  BFR Borderline cuff 4 or 5.  Selected 5 for assessment. LOP in supine 216 mmHg  TODAY'S TREATMENT      DATE:   03/21/24 R Knee Rec bike Recumbent bike, seat 11 Lvl 4 8 mins for ROM Seated SLR 3x12 3# Seated heel slides Reassessment Stair assessment/training  03/01/2024 Prone quadriceps stretch 5 x 20 seconds with belt over opposite shoulder Supine hamstrings stretch 5 x 20 seconds with belt and ankle dorsiflexion Upper heel cords stretch with slight toe in 5 x 30 seconds Knee extension machine 90 - 40, slow eccentrics 5#  2 sets of 10 Seated straight leg raises 2 sets of 10 with 2#  Functional Activities: Single Leg Press with full extension and slow eccentrics 2 sets of 15 with 50#  [id number]: Reviewed objective flexibility measures and updated HEP   TODAY'S TREATMENT      DATE:   02/14/2024 Therex: Recumbent bike, seat 11 Lvl 4 8 mins for ROM Knee extension machine double leg up, Rt leg lowering slowly 5 lbs 3 x 10  Decline ramp eccentric double leg squat focus to tolerance 2 x 10 .  Focus on no anterior tibial translation.  Discussed use of TRX straps and a heel propr for loading at home. Seated quad set with SLR Rt x 15  Review of importance of loading in HEP with quad sets, SLR, and additional eccentric squat activity.   Manual Cross friction ice to patellar ligament Rt.  Education for use at home.     TODAY'S TREATMENT      DATE: 01/30/24 Leg Press bil 112 # 3x12 TRX Curtseys  4 eccentric tap downs 3x10 Recumbent bike Lvl 5 6 mins for ROM LAQ 5# 3x10   PATIENT EDUCATION:  09/22/2023 Education details: HEP update Person educated: Patient Education method: Programmer, Multimedia, Demonstration, Verbal cues, and Handouts Education comprehension: verbalized understanding, returned demonstration, and verbal cues required  HOME EXERCISE PROGRAM: Access Code: [email or web address] URL: [email or web address] Date: 03/01/2024 Prepared by: Lamar Ivory  Exercises - Supine Heel Slide   - 1-2 x daily - 7 x weekly - 1 sets - 10 reps - 5 hold - Supine Knee Extension Strengthening (Mirrored)  - 1-2 x daily - 7 x weekly - 1 sets - 10 reps - 5 hold - Prone Quadriceps Set  - 1-2 x daily - 7 x weekly - 1 sets - 10 reps - 5 hold - Seated Quad Set  - 3-5 x daily - 7 x weekly - 1 sets - 10 reps - 5 hold - Seated SLR  - 1-2 x daily - 7 x weekly - 1-2 sets - 10-15 reps - 2 hold - Braided Sidestepping  - 1 x daily - 7 x weekly - 4-5 sets - 5-7 reps - Tandem Stance  - 1 x daily - 7 x weekly - 4 sets - 2 reps - 30 seconds hold - Mini Squat with Counter Support and Chair Behind You  - 1 x daily - 5 x weekly - 2-3 sets - 10 reps - 3-5 seconds hold - Single Leg Squat with Chair Touch  - 1 x daily - 7 x weekly - 2-3 sets - 8-15 reps - Lunge Matrix on Slider  - 1 x daily - 5 x weekly - 2-3 sets - 5 reps - Supine Hamstring Stretch  - 2-3 x daily - 7 x weekly - 1 sets - 5 reps - 20 seconds hold - Prone Quadricep Stretch with Strap  - 2-3 x daily - 7 x weekly - 1 sets - 5 reps - 20 seconds hold - Slant Board Gastrocnemius  Stretch  - 2-3 x daily - 7 x weekly - 1 sets - 5 reps - 20 seconds hold - Seated Straight Leg Raise   - 1 x daily - 7 x weekly - 10 sets - 5 reps - 2 hold  ASSESSMENT:  CLINICAL IMPRESSION:  Whittaker has  OBJECTIVE IMPAIRMENTS: Abnormal gait, decreased activity tolerance, decreased balance, decreased coordination, decreased endurance, decreased mobility, difficulty walking, decreased ROM, decreased strength, hypomobility, increased edema, increased fascial restrictions, impaired perceived functional ability, increased muscle spasms, impaired flexibility, improper body mechanics, and pain.   ACTIVITY LIMITATIONS: carrying, lifting, bending, sitting, standing, squatting, sleeping, stairs, transfers, bed mobility, and locomotion level  PARTICIPATION LIMITATIONS: meal prep, cleaning, laundry, interpersonal relationship, driving, shopping, community activity, occupation, and yard  work  PERSONAL FACTORS: Anxiety, HTN are also affecting patient's functional outcome.   REHAB POTENTIAL: Good  CLINICAL DECISION MAKING: Stable/uncomplicated  EVALUATION COMPLEXITY: Low   GOALS: Goals reviewed with patient? Yes  SHORT TERM GOALS: (target date for Short term goals are 3 weeks 09/15/2023)   1.  Patient will demonstrate independent use of home exercise program to maintain progress from in clinic treatments.  Goal status: Met 10/07/2023  LONG TERM GOALS: 05/09/2024 7 more weeks     1. Patient will demonstrate/report pain at worst less than or equal to 2/10 to facilitate minimal limitation in daily activity secondary to pain symptoms.  Goal status: Ongoing 03/21/24   2. Patient will demonstrate independent use of home exercise program to facilitate ability to maintain/progress functional gains from skilled physical therapy services.  Goal status: MET 03/21/24   3. Patient will demonstrate Patient specific functional scale avg > or = 8/10 to indicate reduced disability due to condition.   Goal status:  Ongoing 03/21/24  4.  Patient will demonstrate Rt LE MMT 5/5, dynamometry within 15 % of Lt for knee extension to faciltiate usual transfers, stairs, squatting at PLOF for daily life.   (No testing on eval for dynamometry due to surgical protocol)  Goal status:  Ongoing 03/21/24  5.  Patient will demonstrate independent ambulation > 500 ft for community integration at [facility].  Goal status: MET 12/19/23  6.  Patient will demonstrate ascending/descending stairs reciprocally s UE assist for community integration.   Goal status: Ongoing 03/21/24  7.  Patient will demonstrate SLS bilateral > 30 seconds for stability in ambualtion.  Goal Status: MET 01/30/24   PLAN:  PT FREQUENCY: 1-2x week  PT DURATION: 7 weeks (05/09/24)  PLANNED INTERVENTIONS: Can include 02853- PT Re-evaluation, 97110-Therapeutic exercises, 97530- Therapeutic activity, 97112- Neuromuscular  re-education, 97535- Self Care, 97140- Manual therapy, [phone]- Gait training, [phone]- Orthotic Fit/training, [phone]- Canalith repositioning, [id number]- Aquatic Therapy, [phone]- Electrical stimulation (unattended), [phone]- Electrical stimulation (manual), [id number] Physical performance testing, 97016- Vasopneumatic device, [id number]- Ultrasound, [id number]- Traction (mechanical), [id number]- Ionotophoresis 4mg /ml Dexamethasone , Patient/Family education, Balance training, Stair training, Taping, Dry Needling, Joint mobilization, Joint manipulation, Spinal manipulation, Spinal mobilization, Scar mobilization, Vestibular training, Visual/preceptual remediation/compensation, DME instructions, Cryotherapy, and Moist heat.  All performed as medically necessary.  All included unless contraindicated  PLAN FOR NEXT SESSION:Progress strength and eccentric tendon loading, quadriceps strength, steps before return to work.   Burnard Meth, PT 03/21/2024  10:56 AM      "

## 2024-03-21 ENCOUNTER — Ambulatory Visit (INDEPENDENT_AMBULATORY_CARE_PROVIDER_SITE_OTHER)

## 2024-03-21 DIAGNOSIS — M25561 Pain in right knee: Secondary | ICD-10-CM | POA: Diagnosis not present

## 2024-03-21 DIAGNOSIS — R262 Difficulty in walking, not elsewhere classified: Secondary | ICD-10-CM | POA: Diagnosis not present

## 2024-03-21 DIAGNOSIS — M6281 Muscle weakness (generalized): Secondary | ICD-10-CM

## 2024-03-21 DIAGNOSIS — M25661 Stiffness of right knee, not elsewhere classified: Secondary | ICD-10-CM | POA: Diagnosis not present

## 2024-03-21 DIAGNOSIS — R600 Localized edema: Secondary | ICD-10-CM

## 2024-03-27 ENCOUNTER — Ambulatory Visit: Admitting: Family Medicine

## 2024-03-27 ENCOUNTER — Encounter: Payer: Self-pay | Admitting: Family Medicine

## 2024-03-27 VITALS — BP 146/100 | HR 106 | Temp 98.2°F | Ht 72.5 in | Wt 294.4 lb

## 2024-03-27 DIAGNOSIS — M545 Low back pain, unspecified: Secondary | ICD-10-CM | POA: Diagnosis not present

## 2024-03-27 DIAGNOSIS — G8929 Other chronic pain: Secondary | ICD-10-CM | POA: Diagnosis not present

## 2024-03-27 DIAGNOSIS — I10 Essential (primary) hypertension: Secondary | ICD-10-CM

## 2024-03-27 DIAGNOSIS — Z6839 Body mass index (BMI) 39.0-39.9, adult: Secondary | ICD-10-CM

## 2024-03-27 DIAGNOSIS — F1291 Cannabis use, unspecified, in remission: Secondary | ICD-10-CM | POA: Diagnosis not present

## 2024-03-27 DIAGNOSIS — F325 Major depressive disorder, single episode, in full remission: Secondary | ICD-10-CM | POA: Diagnosis not present

## 2024-03-27 DIAGNOSIS — E782 Mixed hyperlipidemia: Secondary | ICD-10-CM | POA: Diagnosis not present

## 2024-03-27 DIAGNOSIS — B009 Herpesviral infection, unspecified: Secondary | ICD-10-CM

## 2024-03-27 DIAGNOSIS — Z87891 Personal history of nicotine dependence: Secondary | ICD-10-CM | POA: Diagnosis not present

## 2024-03-27 DIAGNOSIS — R7301 Impaired fasting glucose: Secondary | ICD-10-CM | POA: Diagnosis not present

## 2024-03-27 LAB — CBC WITH DIFFERENTIAL/PLATELET
Basophils Absolute: 0 K/uL (ref 0.0–0.1)
Basophils Relative: 0.5 % (ref 0.0–3.0)
Eosinophils Absolute: 0.2 K/uL (ref 0.0–0.7)
Eosinophils Relative: 2.1 % (ref 0.0–5.0)
HCT: 48 % (ref 39.0–52.0)
Hemoglobin: 16 g/dL (ref 13.0–17.0)
Lymphocytes Relative: 41.5 % (ref 12.0–46.0)
Lymphs Abs: 3.4 K/uL (ref 0.7–4.0)
MCHC: 33.3 g/dL (ref 30.0–36.0)
MCV: 84.6 fl (ref 78.0–100.0)
Monocytes Absolute: 0.7 K/uL (ref 0.1–1.0)
Monocytes Relative: 8.3 % (ref 3.0–12.0)
Neutro Abs: 3.9 K/uL (ref 1.4–7.7)
Neutrophils Relative %: 47.6 % (ref 43.0–77.0)
Platelets: 289 K/uL (ref 150.0–400.0)
RBC: 5.68 Mil/uL (ref 4.22–5.81)
RDW: 13.6 % (ref 11.5–15.5)
WBC: 8.1 K/uL (ref 4.0–10.5)

## 2024-03-27 LAB — COMPREHENSIVE METABOLIC PANEL WITH GFR
ALT: 38 U/L (ref 3–53)
AST: 18 U/L (ref 5–37)
Albumin: 4.8 g/dL (ref 3.5–5.2)
Alkaline Phosphatase: 61 U/L (ref 39–117)
BUN: 13 mg/dL (ref 6–23)
CO2: 26 meq/L (ref 19–32)
Calcium: 9.9 mg/dL (ref 8.4–10.5)
Chloride: 103 meq/L (ref 96–112)
Creatinine, Ser: 1.15 mg/dL (ref 0.40–1.50)
GFR: 80.78 mL/min
Glucose, Bld: 159 mg/dL — ABNORMAL HIGH (ref 70–99)
Potassium: 4.1 meq/L (ref 3.5–5.1)
Sodium: 137 meq/L (ref 135–145)
Total Bilirubin: 0.4 mg/dL (ref 0.2–1.2)
Total Protein: 8 g/dL (ref 6.0–8.3)

## 2024-03-27 LAB — TSH: TSH: 0.89 u[IU]/mL (ref 0.35–5.50)

## 2024-03-27 MED ORDER — LISINOPRIL 10 MG PO TABS: 10.0000 mg | ORAL_TABLET | Freq: Every day | ORAL | 11 refills | Status: DC

## 2024-03-27 NOTE — Assessment & Plan Note (Signed)
 Chronic, patient reports this is significantly better in the last week given changes in outlook.  Offered medication options or counseling but he will continue to follow and let me know if he has issues return.

## 2024-03-27 NOTE — Assessment & Plan Note (Signed)
 Due for reevaluation.  Will add A1c if glucose again elevated.

## 2024-03-27 NOTE — Assessment & Plan Note (Signed)
 Patient has quit in the last week.  Encouraged him to continue remaining off.  He plans to continue with method of cold turkey.  No medications indicated, but he can call if having trouble.

## 2024-03-27 NOTE — Assessment & Plan Note (Signed)
 Chronic, recently has improved with return to exercise.

## 2024-03-27 NOTE — Assessment & Plan Note (Signed)
 Due for reevaluation

## 2024-03-27 NOTE — Assessment & Plan Note (Signed)
 Chronic, high risk for comorbidities including diabetes and sleep apnea. Consider sleep testing if blood pressure persistently elevated or snoring remains despite weight loss.

## 2024-03-27 NOTE — Assessment & Plan Note (Addendum)
"   Chronic, poor  control.   Will restart lisinopril  10 mg daily as long as check of kidney function remains in the normal range with labs today.  Working on healthy eating habits and will change. Patient will measure blood pressure with arm cuff at home and call with measurements in the next 2 weeks. "

## 2024-03-27 NOTE — Assessment & Plan Note (Addendum)
 Patient has now quit in the last week.  Discussed how marijuana use can affect mood.   Congratulated him on his success and encouraged him to remain off.

## 2024-03-27 NOTE — Patient Instructions (Signed)
"   Follow BP at home after restarting lisinopril .. Goal < 140/90.SABRA call or send a message with BP results in 2 weeks. "

## 2024-03-27 NOTE — Assessment & Plan Note (Signed)
"   History of genital herpes.  No outbreaks. "

## 2024-03-27 NOTE — Progress Notes (Signed)
 "   Patient ID: Dale Fitzgerald, male    DOB: 1985-09-21, 39 y.o.   MRN: [id number]  This visit was conducted in person.  BP (!) 146/100   Pulse (!) 106   Temp 98.2 F (36.8 C) (Oral)   Ht 6' 0.5 (1.842 m)   Wt 294 lb 6 oz (133.5 kg)   SpO2 97%   BMI 39.38 kg/m    CC:  Chief Complaint  Patient presents with   Establish Care    Subjective:   HPI: Dale Fitzgerald is a 39 y.o. male presenting on 03/27/2024 for Establish Care  Previous PCP: Lauraine Pepper in [place]. Last CPX: not recent. Has not had recent labs.  Patellar tendon rupture 06/2023, S/P surgical repair.SABRA out of work.  Doing PT.  Pt is very nervous at today's appt.. BP and HR elevated.  HTN;   Has been off lisinopril  10 mg daily BP Readings from Last 3 Encounters:  03/27/24 (!) 146/100  07/12/23 139/86  08/21/21 131/67   Recently stopped smoking tobacco 2 pack every 2 days,  MJ (4 blunts a day) on day 6.  No ETOH.  Wt Readings from Last 3 Encounters:  03/27/24 294 lb 6 oz (133.5 kg)  08/21/21 254 lb (115.2 kg)   Since knee injury Has started back at gym. Has been avoid fast food, eating more veggies.  High cholesterol  and prediabetes  Depression, GAD.SABRA related to injury.  Has been feeling more positive since quitting MJ.  No SI.' 2021 episode of suicidal ideation.  Has a counselor seeing off and on.    03/27/2024   11:29 AM  Depression screen PHQ 2/9  Decreased Interest 1  Down, Depressed, Hopeless 2  PHQ - 2 Score 3  Altered sleeping 3  Tired, decreased energy 3  Change in appetite 3  Feeling bad or failure about yourself  2  Trouble concentrating 3  Moving slowly or fidgety/restless 2  Suicidal thoughts 0  PHQ-9 Score 19  Difficult doing work/chores Very difficult      03/27/2024   11:29 AM  GAD 7 : Generalized Anxiety Score  Nervous, Anxious, on Edge 3  Control/stop worrying 3  Worry too much - different things 3  Trouble relaxing 3  Restless 3  Easily annoyed or irritable 3   Afraid - awful might happen 2  Total GAD 7 Score 20  Anxiety Difficulty Very difficult       Father with CHF, HTN.and DM.  Relevant past medical, surgical, family and social history reviewed and updated as indicated. Interim medical history since our last visit reviewed. Allergies and medications reviewed and updated. Outpatient Medications Prior to Visit  Medication Sig Dispense Refill   apixaban  (ELIQUIS ) 2.5 MG TABS tablet Take one tab po bid x 30 days to prevent blood clots (Patient not taking: Reported on 03/27/2024) 60 tablet 0   rosuvastatin (CRESTOR) 10 MG tablet Take 10 mg by mouth at bedtime. (Patient not taking: Reported on 03/27/2024)     ADVIL 200 MG CAPS Take 200-400 mg by mouth every 6 (six) hours as needed (for mild pain or headaches).     diazepam  (VALIUM ) 5 MG tablet Take 1 tablet (5 mg total) by mouth every 6 (six) hours as needed for anxiety or muscle spasms. 20 tablet 0   gabapentin (NEURONTIN) 300 MG capsule Take 300 mg by mouth daily as needed (for nerve pain).     HYDROcodone -acetaminophen  (NORCO/VICODIN) 5-325 MG tablet Take 2 tablets by mouth  every 4 (four) hours as needed. 10 tablet 0   HYDROcodone -acetaminophen  (NORCO/VICODIN) 5-325 MG tablet Take 1 tablet by mouth 3 (three) times daily as needed for moderate pain (pain score 4-6). To be taken after surgery 21 tablet 0   HYDROcodone -acetaminophen  (NORCO/VICODIN) 5-325 MG tablet Take 1 tablet by mouth 2 (two) times daily as needed for moderate pain (pain score 4-6). 20 tablet 0   lisinopril  (ZESTRIL ) 10 MG tablet Take 10 mg by mouth daily. (Patient not taking: Reported on 03/27/2024)     methocarbamol  (ROBAXIN -750) 750 MG tablet Take 1 tablet (750 mg total) by mouth 2 (two) times daily as needed for muscle spasms. 20 tablet 0   ondansetron  (ZOFRAN ) 4 MG tablet Take 1 tablet (4 mg total) by mouth every 8 (eight) hours as needed for nausea or vomiting. 40 tablet 0   oxyCODONE -acetaminophen  (PERCOCET) 5-325 MG tablet Take  1 tablet by mouth 3 (three) times daily as needed. (Patient not taking: Reported on 03/27/2024) 15 tablet 0   UNKNOWN TO PATIENT Take 1 tablet by mouth See admin instructions. Unnamed B/P medication: Take 1 tablet by mouth once a day     No facility-administered medications prior to visit.     Per HPI unless specifically indicated in ROS section below Review of Systems  Constitutional:  Negative for fatigue and fever.  HENT:  Negative for ear pain.   Eyes:  Negative for pain.  Respiratory:  Negative for cough and shortness of breath.   Cardiovascular:  Negative for chest pain, palpitations and leg swelling.  Gastrointestinal:  Negative for abdominal pain.  Genitourinary:  Negative for dysuria.  Musculoskeletal:  Negative for arthralgias.  Neurological:  Negative for syncope, light-headedness and headaches.  Psychiatric/Behavioral:  Negative for dysphoric mood.    Objective:  BP (!) 146/100   Pulse (!) 106   Temp 98.2 F (36.8 C) (Oral)   Ht 6' 0.5 (1.842 m)   Wt 294 lb 6 oz (133.5 kg)   SpO2 97%   BMI 39.38 kg/m   Wt Readings from Last 3 Encounters:  03/27/24 294 lb 6 oz (133.5 kg)  08/21/21 254 lb (115.2 kg)      Physical Exam Vitals reviewed.  Constitutional:      Appearance: He is well-developed. He is obese.  HENT:     Head: Normocephalic.     Right Ear: Hearing normal.     Left Ear: Hearing normal.     Nose: Nose normal.  Neck:     Thyroid: No thyroid mass or thyromegaly.     Vascular: No carotid bruit.     Trachea: Trachea normal.  Cardiovascular:     Rate and Rhythm: Normal rate and regular rhythm.     Pulses: Normal pulses.     Heart sounds: Heart sounds not distant. No murmur heard.    No friction rub. No gallop.     Comments: No peripheral edema Pulmonary:     Effort: Pulmonary effort is normal. No respiratory distress.     Breath sounds: Normal breath sounds.  Skin:    General: Skin is warm and dry.     Findings: No rash.  Psychiatric:         Speech: Speech normal.        Behavior: Behavior normal.        Thought Content: Thought content normal.       Results for orders placed or performed in visit on 03/27/24  TSH   Collection Time: 03/27/24 12:18 PM  Result Value Ref Range   TSH 0.89 0.35 - 5.50 uIU/mL  Comprehensive metabolic panel with GFR   Collection Time: 03/27/24 12:18 PM  Result Value Ref Range   Sodium 137 135 - 145 mEq/L   Potassium 4.1 3.5 - 5.1 mEq/L   Chloride 103 96 - 112 mEq/L   CO2 26 19 - 32 mEq/L   Glucose, Bld 159 (H) 70 - 99 mg/dL   BUN 13 6 - 23 mg/dL   Creatinine, Ser 8.84 0.40 - 1.50 mg/dL   Total Bilirubin 0.4 0.2 - 1.2 mg/dL   Alkaline Phosphatase 61 39 - 117 U/L   AST 18 5 - 37 U/L   ALT 38 3 - 53 U/L   Total Protein 8.0 6.0 - 8.3 g/dL   Albumin 4.8 3.5 - 5.2 g/dL   GFR 19.21 >39.99 mL/min   Calcium 9.9 8.4 - 10.5 mg/dL  CBC with Differential/Platelet   Collection Time: 03/27/24 12:18 PM  Result Value Ref Range   WBC 8.1 4.0 - 10.5 K/uL   RBC 5.68 4.22 - 5.81 Mil/uL   Hemoglobin 16.0 13.0 - 17.0 g/dL   HCT 51.9 60.9 - 47.9 %   MCV 84.6 78.0 - 100.0 fl   MCHC 33.3 30.0 - 36.0 g/dL   RDW 86.3 88.4 - 84.4 %   Platelets 289.0 150.0 - 400.0 K/uL   Neutrophils Relative % 47.6 43.0 - 77.0 %   Lymphocytes Relative 41.5 12.0 - 46.0 %   Monocytes Relative 8.3 3.0 - 12.0 %   Eosinophils Relative 2.1 0.0 - 5.0 %   Basophils Relative 0.5 0.0 - 3.0 %   Neutro Abs 3.9 1.4 - 7.7 K/uL   Lymphs Abs 3.4 0.7 - 4.0 K/uL   Monocytes Absolute 0.7 0.1 - 1.0 K/uL   Eosinophils Absolute 0.2 0.0 - 0.7 K/uL   Basophils Absolute 0.0 0.0 - 0.1 K/uL    Assessment and Plan  Mixed hyperlipidemia Assessment & Plan:  Due for re-evaluation.   Primary hypertension Assessment & Plan:  Chronic, poor  control.   Will restart lisinopril  10 mg daily as long as check of kidney function remains in the normal range with labs today.  Working on healthy eating habits and will change. Patient will measure blood  pressure with arm cuff at home and call with measurements in the next 2 weeks.  Orders: -     TSH -     Comprehensive metabolic panel with GFR -     CBC with Differential/Platelet  HSV-2 infection Assessment & Plan:  History of genital herpes.  No outbreaks.   Morbid obesity (HCC) Assessment & Plan: Chronic, high risk for comorbidities including diabetes and sleep apnea. Consider sleep testing if blood pressure persistently elevated or snoring remains despite weight loss.   Chronic midline low back pain without sciatica Assessment & Plan: Chronic, recently has improved with return to exercise.   Elevated fasting glucose Assessment & Plan: Due for reevaluation.  Will add A1c if glucose again elevated.   History of tobacco use disorder Assessment & Plan: Patient has quit in the last week.  Encouraged him to continue remaining off.  He plans to continue with method of cold turkey.  No medications indicated, but he can call if having trouble.   Major depressive disorder with single episode, in remission Assessment & Plan: Chronic, patient reports this is significantly better in the last week given changes in outlook.  Offered medication options or counseling but he will continue to  follow and let me know if he has issues return.   History of marijuana use Assessment & Plan: Patient has now quit in the last week.  Discussed how marijuana use can affect mood.   Congratulated him on his success and encouraged him to remain off.   Other orders -     Lisinopril ; Take 1 tablet (10 mg total) by mouth daily.  Dispense: 30 tablet; Refill: 11    Return in about 3 months (around 06/25/2024) for annual physical with fasting labs prior.   Greig Ring, MD  "

## 2024-03-28 ENCOUNTER — Ambulatory Visit

## 2024-03-28 DIAGNOSIS — R7301 Impaired fasting glucose: Secondary | ICD-10-CM

## 2024-03-28 LAB — HEMOGLOBIN A1C: Hgb A1c MFr Bld: 8.2 % — ABNORMAL HIGH (ref 4.6–6.5)

## 2024-03-28 NOTE — Addendum Note (Signed)
 Addended by: HOPE VEVA PARAS on: 03/28/2024 07:37 AM   Modules accepted: Orders

## 2024-03-29 ENCOUNTER — Ambulatory Visit: Payer: Self-pay | Admitting: Family Medicine

## 2024-03-29 ENCOUNTER — Ambulatory Visit (INDEPENDENT_AMBULATORY_CARE_PROVIDER_SITE_OTHER): Admitting: Rehabilitative and Restorative Service Providers"

## 2024-03-29 DIAGNOSIS — M6281 Muscle weakness (generalized): Secondary | ICD-10-CM | POA: Diagnosis not present

## 2024-03-29 DIAGNOSIS — E1059 Type 1 diabetes mellitus with other circulatory complications: Secondary | ICD-10-CM

## 2024-03-29 DIAGNOSIS — R262 Difficulty in walking, not elsewhere classified: Secondary | ICD-10-CM

## 2024-03-29 DIAGNOSIS — R600 Localized edema: Secondary | ICD-10-CM

## 2024-03-29 DIAGNOSIS — M25561 Pain in right knee: Secondary | ICD-10-CM | POA: Diagnosis not present

## 2024-03-29 DIAGNOSIS — M25661 Stiffness of right knee, not elsewhere classified: Secondary | ICD-10-CM | POA: Diagnosis not present

## 2024-03-29 DIAGNOSIS — E1159 Type 2 diabetes mellitus with other circulatory complications: Secondary | ICD-10-CM

## 2024-03-29 NOTE — Therapy (Signed)
 " OUTPATIENT PHYSICAL THERAPY TREATMENT   Patient Name: Dale Fitzgerald MRN: [id number] DOB:August 23, 1985, 39 y.o., male Today's Date: 03/29/2024  END OF SESSION:  PT End of Session - 03/29/24 1605     Visit Number 24    Number of Visits 54    Date for Recertification  05/09/24    Authorization Type [employer] $40 copay    PT Start Time 1600    PT Stop Time 1639    PT Time Calculation (min) 39 min    Activity Tolerance Patient tolerated treatment well    Behavior During Therapy [facility] for tasks assessed/performed            Past Medical History:  Diagnosis Date   Anxiety    Hypertension    Past Surgical History:  Procedure Laterality Date   APPENDECTOMY     Patient Active Problem List   Diagnosis Date Noted   Morbid obesity (HCC) 03/27/2024   Chronic low back pain 03/27/2024   History of tobacco use disorder 03/27/2024   History of marijuana use 03/27/2024   Patellar tendon rupture, right, initial encounter 07/12/2023   Mixed hyperlipidemia 09/01/2021   Primary hypertension 09/01/2021   Elevated fasting glucose 09/01/2021   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   REFERRING PROVIDER: Jule Ronal LITTIE DEVONNA  REFERRING DIAG: [phone] (ICD-10-CM) - Patellar tendon rupture, right, initial encounter  THERAPY DIAG:  Acute pain of right knee  Muscle weakness (generalized)  Stiffness of right knee, not elsewhere classified  Difficulty in walking, not elsewhere classified  Localized edema  Rationale for Evaluation and Treatment: Rehabilitation  ONSET DATE: Surgery 07/21/2023    SUBJECTIVE:   SUBJECTIVE STATEMENT:  Pt indicated feeling less overall pain complaints.  Pt indicated some symptoms here and there.   Reported stairs getting some easier.    PERTINENT HISTORY: Right patella tendon rupture 07/11/2023 with surgery 07/21/2023. Had been wearing hinge brace 0-30 degrees prior to 08/23/2023 PA visit.  Plan  for follow up in 4 weeks for brace unlocking to 90 deg.  Currently set from 0-60.   PAIN:  NPRS scale: at worst 1-2/10 in last 1-2 days Pain location: Rt knee anteriorly Pain description: achy Aggravating factors: Weight-bearing and end range for flexion Relieving factors: Rest, exercises  PRECAUTIONS: Knee - No in house protocol directly provided.  Respected protocol from [facility] in folder bin in clinic.  10/05/23: per Doctors orders, okay for out of brace PROM as tolerated   WEIGHT BEARING RESTRICTIONS: Brace 0-90 per PA visit on 09/20/2023   FALLS:  Has patient fallen in last 6 months? Yes. Number of falls 1 - current injury  LIVING ENVIRONMENT: Lives in: House/apartment Stairs: Flight of stairs in house.    Outside small step for porch without rail Has following equipment at home: Bilateral crutches, locking stairs.   OCCUPATION: Has worked 3 jobs. 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   PLOF: Independent, basketball recreationally, playing sports with son.    PATIENT GOALS: Reduce pain, get back to work/life. Get stronger  OBJECTIVE:   PATIENT SURVEYS:  Patient-Specific Activity Scoring Scheme  0 represents unable to perform. 10 represents able to perform at prior level. 0 1 2 3 4 5 6 7 8 9  10 (Date and Score)   Activity Eval  08/25/2023  11/01/2023 12/19/23 01/24/2024 01/30/24 03/21/24  1. Walking  independently  0 5  6 7 7 9   2. Stairs  4  4 4 5 5 5   3. work 0 4 0 0 0 0  4.Recreational sport 0 0 0 0 0 0  5.        Score 1 avg 2.75 avg 2.5 3.0 3.0 3.5   Total score = sum of the activity scores/number of activities Minimum detectable change (90%CI) for average score = 2 points Minimum detectable change (90%CI) for single activity score = 3 points  COGNITION: 08/25/2023 Overall cognitive status: WFL    SENSATION: 08/25/2023 WFL  EDEMA:  02/14/2024: Circumferential jt line Rt: 43.5 cm Lt:  43.5  cm  08/25/2023 Localized edema Rt knee  MUSCLE LENGTH: 08/25/2023 No specific testing.   POSTURE:  08/25/2023 Unremarkable   PALPATION: 08/25/2023 Mild tenderness around incision.   LOWER EXTREMITY ROM:   ROM Right Eval 08/25/2023 Left Eval 08/25/2023 Right 09/15/2023 Right  10/05/2023 Right 10/12/2023  Right 11/01/2023 Right  12/19/23 Right 01/24/2024 Right 03/21/2024  Knee flexion 60 AROM in supine heel slide with pain   P: 92 in supine with pain   AROM in supine heel slide 92 95 AROM in supine heel slide:  120 122  Knee extension -16 AROM in seated LAQ  -9 in supine heel prop  -5 in seated LAQ AROM Rt -4 in supine with heel prop 0 in heel prop 0 AROM in leg raise seated 0 AROM in seated LAQ: -5  PROM in supine heel prop:  1 deg hyperextension 0  Hamstrings         50/40  Quadriceps         115/110  Heel cords         3/0   (Blank rows = not tested)  LOWER EXTREMITY MMT:  MMT Right Eval 08/25/2023 Left Eval 08/25/2023 Right 09/19/2023 Right 12/19/23 Right 01/24/2024 Left 01/24/2024 Right 02/14/2024 Right  02/3124 Right 03/29/2024  Hip flexion 3+/5 5/5 5/5        Hip extension           Hip abduction           Hip adduction           Hip internal rotation           Hip external rotation           Knee flexion 4/5 5/5 4/5 4+       Knee extension NT 5/5 3+/5 4- 4+/5 24, 27 lbs 5/5 80, 81 lbs 23.4, 23.3 lbs  R 56 cm girth at 10 cm above patella L 55    4+/549, 46 lbs   Ankle dorsiflexion 5/5  5/5        Ankle plantarflexion           Ankle inversion           Ankle eversion            (Blank rows = not tested)  LOWER EXTREMITY SPECIAL TESTS:  08/25/2023 No specific testing.   FUNCTIONAL TESTS:  01/30/24  Rt SLS:  31  sec  08/25/2023 18 inch chair transfer: Unable unassist  Lt SLS: not tested   GAIT: 11/01/2023: Independent ambulation, no brace.  Deviations noted in stance on Rt leg, toe off progression.   09/22/2023: Single axillary crutch, brace 0-90 deg.    09/15/2023: One axillary crutch with brace 0-60 deg.   08/25/2023 Hinge brace 0-60 degrees with bilateral axillary crutches.  Step to  pattern primarily with partial WB noted on Rt leg. Associated deviations due to brace locking at 60 deg (hip circumduction, hike etc)  BFR Borderline cuff 4 or 5.  Selected 5 for assessment. LOP in supine 216 mmHg                                                                                                                                                                       TODAY'S TREATMENT      DATE:  03/29/2024 Therex: Recumbent bike lvl 5 6 mins for ROM, seat 11 Knee extension machine double leg up, single leg lowering 10 lbs 2 x 10 Incline gastroc stretch 30 sec x 3    TherActivity (to improve squatting, stairs) Decline ramp squat with focus on limited tibial anterior displacement x 10 - movement to tolerant level Lateral step down 4 inch step with slow lowering focus x 15 Leg press Rt leg only 50 lbs to fatigue x30  Neuro Re-ed Step to pattern high knee over 9 inch hurdle 10 ft x 5 leading with each LE Lateral stepping over 9 inch hurdle 10 ft x 5 each way with each LE leading     TODAY'S TREATMENT      DATE:  03/21/24  Rec bike Recumbent bike, seat 11 Lvl 4 8 mins for ROM Seated SLR 3x12 3# Seated heel slides Reassessment Stair assessment/training  03/01/2024 Prone quadriceps stretch 5 x 20 seconds with belt over opposite shoulder Supine hamstrings stretch 5 x 20 seconds with belt and ankle dorsiflexion Upper heel cords stretch with slight toe in 5 x 30 seconds Knee extension machine 90 - 40, slow eccentrics 5#  2 sets of 10 Seated straight leg raises 2 sets of 10 with 2#  Functional Activities: Single Leg Press with full extension and slow eccentrics 2 sets of 15 with 50#  [id number]: Reviewed objective flexibility measures and updated HEP   TODAY'S TREATMENT      DATE:  02/14/2024 Therex: Recumbent bike, seat 11 Lvl 4 8 mins  for ROM Knee extension machine double leg up, Rt leg lowering slowly 5 lbs 3 x 10  Decline ramp eccentric double leg squat focus to tolerance 2 x 10 .  Focus on no anterior tibial translation.  Discussed use of TRX straps and a heel propr for loading at home. Seated quad set with SLR Rt x 15  Review of importance of loading in HEP with quad sets, SLR, and additional eccentric squat activity.   Manual Cross friction ice to patellar ligament Rt.  Education for use at home.    PATIENT EDUCATION:  09/22/2023 Education details: HEP update Person educated: Patient Education method: Explanation, Demonstration, Verbal cues, and Handouts Education comprehension: verbalized understanding, returned demonstration, and  verbal cues required  HOME EXERCISE PROGRAM: Access Code: [email or web address] URL: [email or web address] Date: 03/01/2024 Prepared by: Lamar Ivory  Exercises - Supine Heel Slide  - 1-2 x daily - 7 x weekly - 1 sets - 10 reps - 5 hold - Supine Knee Extension Strengthening (Mirrored)  - 1-2 x daily - 7 x weekly - 1 sets - 10 reps - 5 hold - Prone Quadriceps Set  - 1-2 x daily - 7 x weekly - 1 sets - 10 reps - 5 hold - Seated Quad Set  - 3-5 x daily - 7 x weekly - 1 sets - 10 reps - 5 hold - Seated SLR  - 1-2 x daily - 7 x weekly - 1-2 sets - 10-15 reps - 2 hold - Braided Sidestepping  - 1 x daily - 7 x weekly - 4-5 sets - 5-7 reps - Tandem Stance  - 1 x daily - 7 x weekly - 4 sets - 2 reps - 30 seconds hold - Mini Squat with Counter Support and Chair Behind You  - 1 x daily - 5 x weekly - 2-3 sets - 10 reps - 3-5 seconds hold - Single Leg Squat with Chair Touch  - 1 x daily - 7 x weekly - 2-3 sets - 8-15 reps - Lunge Matrix on Slider  - 1 x daily - 5 x weekly - 2-3 sets - 5 reps - Supine Hamstring Stretch  - 2-3 x daily - 7 x weekly - 1 sets - 5 reps - 20 seconds hold - Prone Quadricep Stretch with Strap  - 2-3 x daily - 7 x weekly - 1 sets - 5 reps - 20 seconds hold - Slant  Board Gastrocnemius Stretch  - 2-3 x daily - 7 x weekly - 1 sets - 5 reps - 20 seconds hold - Seated Straight Leg Raise   - 1 x daily - 7 x weekly - 10 sets - 5 reps - 2 hold  ASSESSMENT:  CLINICAL IMPRESSION:  Strength updates showed improvement in quad testing compared to last update end of Nov.  Still lacking compared to Lt side at this time but making gains.  Quality of ambulation was improved today upon arrival.   OBJECTIVE IMPAIRMENTS: Abnormal gait, decreased activity tolerance, decreased balance, decreased coordination, decreased endurance, decreased mobility, difficulty walking, decreased ROM, decreased strength, hypomobility, increased edema, increased fascial restrictions, impaired perceived functional ability, increased muscle spasms, impaired flexibility, improper body mechanics, and pain.   ACTIVITY LIMITATIONS: carrying, lifting, bending, sitting, standing, squatting, sleeping, stairs, transfers, bed mobility, and locomotion level  PARTICIPATION LIMITATIONS: meal prep, cleaning, laundry, interpersonal relationship, driving, shopping, community activity, occupation, and yard work  PERSONAL FACTORS: Anxiety, HTN are also affecting patient's functional outcome.   REHAB POTENTIAL: Good  CLINICAL DECISION MAKING: Stable/uncomplicated  EVALUATION COMPLEXITY: Low   GOALS: Goals reviewed with patient? Yes  SHORT TERM GOALS: (target date for Short term goals are 3 weeks 09/15/2023)   1.  Patient will demonstrate independent use of home exercise program to maintain progress from in clinic treatments.  Goal status: Met 10/07/2023  LONG TERM GOALS: 05/09/2024 7 more weeks     1. Patient will demonstrate/report pain at worst less than or equal to 2/10 to facilitate minimal limitation in daily activity secondary to pain symptoms.  Goal status: Ongoing 03/21/24   2. Patient will demonstrate independent use of home exercise program to facilitate ability to maintain/progress  functional gains from skilled physical therapy services.  Goal status:  MET 03/21/24   3. Patient will demonstrate Patient specific functional scale avg > or = 8/10 to indicate reduced disability due to condition.   Goal status:  Ongoing 03/21/24  4.  Patient will demonstrate Rt LE MMT 5/5, dynamometry within 15 % of Lt for knee extension to faciltiate usual transfers, stairs, squatting at PLOF for daily life.   (No testing on eval for dynamometry due to surgical protocol)  Goal status:  Ongoing 03/21/24  5.  Patient will demonstrate independent ambulation > 500 ft for community integration at [facility].  Goal status: MET 12/19/23  6.  Patient will demonstrate ascending/descending stairs reciprocally s UE assist for community integration.   Goal status: Ongoing 03/21/24  7.  Patient will demonstrate SLS bilateral > 30 seconds for stability in ambualtion.  Goal Status: MET 01/30/24   PLAN:  PT FREQUENCY: 1-2x week  PT DURATION: 7 weeks (05/09/24)  PLANNED INTERVENTIONS: Can include 02853- PT Re-evaluation, 97110-Therapeutic exercises, 97530- Therapeutic activity, 97112- Neuromuscular re-education, 97535- Self Care, 97140- Manual therapy, [phone]- Gait training, [phone]- Orthotic Fit/training, [phone]- Canalith repositioning, [id number]- Aquatic Therapy, [phone]- Electrical stimulation (unattended), [phone]- Electrical stimulation (manual), [id number] Physical performance testing, 97016- Vasopneumatic device, [id number]- Ultrasound, [id number]- Traction (mechanical), [id number]- Ionotophoresis 4mg /ml Dexamethasone , Patient/Family education, Balance training, Stair training, Taping, Dry Needling, Joint mobilization, Joint manipulation, Spinal manipulation, Spinal mobilization, Scar mobilization, Vestibular training, Visual/preceptual remediation/compensation, DME instructions, Cryotherapy, and Moist heat.  All performed as medically necessary.  All included unless contraindicated  PLAN FOR NEXT SESSION:Progress strength and  eccentric tendon loading, quadriceps strength   Ozell Silvan, PT, DPT, OCS, ATC 03/29/2024  4:37 PM      "

## 2024-04-03 ENCOUNTER — Ambulatory Visit: Admitting: Orthopaedic Surgery

## 2024-04-03 DIAGNOSIS — S86811D Strain of other muscle(s) and tendon(s) at lower leg level, right leg, subsequent encounter: Secondary | ICD-10-CM

## 2024-04-03 NOTE — Progress Notes (Signed)
 "  Office Visit Note   Patient: Dale Fitzgerald           Date of Birth: [birth year]           MRN: [id number] Visit Date: 04/03/2024              Requested by: Avelina Greig BRAVO, MD [address] [place],  [place] [address] PCP: Avelina Greig BRAVO, MD   Assessment & Plan: Visit Diagnoses:  1. Patellar tendon rupture, right, subsequent encounter     Plan: History of Present Illness Dale Fitzgerald is a 39 year old male, status post right patellar tendon repair, who presents for follow-up of persistent right knee symptoms.  He has ongoing soreness at the right patella with intermittent swelling that worsens with activity. He notes pain with push-off, abnormal gait, and difficulty ascending stairs one foot at a time, which causes abnormal knee sensations.  He attends weekly physical therapy that causes several hours of post-session pain but has improved quadriceps and hamstring strength. He continues gym-based strengthening but does not feel able to return to his physically demanding job due to persistent symptoms.  His disability insurance benefits stopped on December 27 due to lapse in follow-up documentation. He has not received payment and requests updated documentation to support his disability claim, reporting financial stress from loss of benefits.  Examination of the right knee shows full healed surgical scar.  Range of motion is 0 to 120 degrees.  No signs of infection.  Assessment and Plan Right patellar tendon repair, post-surgical recovery Eight months post-repair, he has soreness, intermittent swelling, and discomfort with activity. Strength and flexibility improved, but gait abnormal. Repair strong with low rerupture risk. Ongoing work absence appropriate due to symptoms and job demands. - Recommended continued work absence until [employer] for strengthening and recovery. - Provided reassurance about low rerupture risk and repair integrity. - Issued updated work note for  disability benefits.  Follow-Up Instructions: No follow-ups on file.   Orders:  No orders of the defined types were placed in this encounter.  No orders of the defined types were placed in this encounter.     Procedures: No procedures performed   Clinical Data: No additional findings.   Subjective: Chief Complaint  Patient presents with   Right Knee - Pain    Status post patellar tendon repair  07/21/2023     HPI  Review of Systems  Constitutional: Negative.   HENT: Negative.    Eyes: Negative.   Respiratory: Negative.    Cardiovascular: Negative.   Gastrointestinal: Negative.   Endocrine: Negative.   Genitourinary: Negative.   Skin: Negative.   Allergic/Immunologic: Negative.   Neurological: Negative.   Hematological: Negative.   Psychiatric/Behavioral: Negative.    All other systems reviewed and are negative.    Objective: Vital Signs: There were no vitals taken for this visit.  Physical Exam Vitals and nursing note reviewed.  Constitutional:      Appearance: He is well-developed.  HENT:     Head: Normocephalic and atraumatic.  Eyes:     Pupils: Pupils are equal, round, and reactive to light.  Pulmonary:     Effort: Pulmonary effort is normal.  Abdominal:     Palpations: Abdomen is soft.  Musculoskeletal:        General: Normal range of motion.     Cervical back: Neck supple.  Skin:    General: Skin is warm.  Neurological:     Mental Status: He is  alert and oriented to person, place, and time.  Psychiatric:        Behavior: Behavior normal.        Thought Content: Thought content normal.        Judgment: Judgment normal.     Ortho Exam  Specialty Comments:  No specialty comments available.  Imaging: No results found.   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   Major depressive disorder in remission    Past Medical History:  Diagnosis Date   Anxiety    Hypertension     Family History  Problem Relation Age of Onset   Cancer Mother 39       stomach cancer, mets to brain   Heart disease Father    COPD Father    Hyperlipidemia Father    Hypertension Father    Diabetes Father    Healthy Sister    Healthy Brother    Healthy Son     Past Surgical History:  Procedure Laterality Date   APPENDECTOMY     Social History   Occupational History   Not on file  Tobacco Use   Smoking status: Former    Current packs/day: 0.50    Average packs/day: 0.5 packs/day for 10.0 years (5.0 ttl pk-yrs)    Types: Cigarettes   Smokeless tobacco: Not on file  Vaping Use   Vaping status: Never Used  Substance and Sexual Activity   Alcohol use: Yes   Drug use: Yes    Types: Marijuana   Sexual activity: Not on file        "

## 2024-04-03 NOTE — Therapy (Signed)
 " OUTPATIENT PHYSICAL THERAPY TREATMENT   Patient Name: Dale Fitzgerald MRN: [id number] DOB:05/03/1985, 39 y.o., male Today's Date: 04/04/2024  END OF SESSION:  PT End of Session - 04/04/24 1110     Visit Number 25    Number of Visits 54    Date for Recertification  05/09/24    Authorization Type [employer] $40 copay    PT Start Time 1110    PT Stop Time 1140    PT Time Calculation (min) 30 min    Activity Tolerance Patient tolerated treatment well    Behavior During Therapy [facility] for tasks assessed/performed             Past Medical History:  Diagnosis Date   Anxiety    Hypertension    Past Surgical History:  Procedure Laterality Date   APPENDECTOMY     Patient Active Problem List   Diagnosis Date Noted   Morbid obesity (HCC) 03/27/2024   Chronic low back pain 03/27/2024   History of tobacco use disorder 03/27/2024   History of marijuana use 03/27/2024   Patellar tendon rupture, right, initial encounter 07/12/2023   Mixed hyperlipidemia 09/01/2021   Primary hypertension 09/01/2021   Elevated fasting glucose 09/01/2021   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   REFERRING PROVIDER: Jule Ronal LITTIE DEVONNA  REFERRING DIAG: [phone] (ICD-10-CM) - Patellar tendon rupture, right, initial encounter  THERAPY DIAG:  Acute pain of right knee  Muscle weakness (generalized)  Stiffness of right knee, not elsewhere classified  Difficulty in walking, not elsewhere classified  Localized edema  Rationale for Evaluation and Treatment: Rehabilitation  ONSET DATE: Surgery 07/21/2023    SUBJECTIVE:   SUBJECTIVE STATEMENT:  Patient reports increase in pain and soreness following last session.  PERTINENT HISTORY: Right patella tendon rupture 07/11/2023 with surgery 07/21/2023. Had been wearing hinge brace 0-30 degrees prior to 08/23/2023 PA visit.  Plan for follow up in 4 weeks for brace unlocking to 90 deg.   Currently set from 0-60.   PAIN:  NPRS scale: 5/10 Pain location: Rt knee anteriorly Pain description: achy Aggravating factors: Weight-bearing and end range for flexion Relieving factors: Rest, exercises  PRECAUTIONS: Knee - No in house protocol directly provided.  Respected protocol from [facility] in folder bin in clinic.  10/05/23: per Doctors orders, okay for out of brace PROM as tolerated   WEIGHT BEARING RESTRICTIONS: Brace 0-90 per PA visit on 09/20/2023   FALLS:  Has patient fallen in last 6 months? Yes. Number of falls 1 - current injury  LIVING ENVIRONMENT: Lives in: House/apartment Stairs: Flight of stairs in house.    Outside small step for porch without rail Has following equipment at home: Bilateral crutches, locking stairs.   OCCUPATION: Has worked 3 jobs. 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   PLOF: Independent, basketball recreationally, playing sports with son.    PATIENT GOALS: Reduce pain, get back to work/life. Get stronger  OBJECTIVE:   PATIENT SURVEYS:  Patient-Specific Activity Scoring Scheme  0 represents unable to perform. 10 represents able to perform at prior level. 0 1 2 3 4 5 6 7 8 9  10 (Date and Score)   Activity Eval  08/25/2023  11/01/2023 12/19/23 01/24/2024 01/30/24 03/21/24  1. Walking independently  0 5  6 7 7 9   2. Stairs  4  4 4 5  5  5  3. work 0 4 0 0 0 0  4.Recreational sport 0 0 0 0 0 0  5.        Score 1 avg 2.75 avg 2.5 3.0 3.0 3.5   Total score = sum of the activity scores/number of activities Minimum detectable change (90%CI) for average score = 2 points Minimum detectable change (90%CI) for single activity score = 3 points  COGNITION: 08/25/2023 Overall cognitive status: WFL    SENSATION: 08/25/2023 WFL  EDEMA:  02/14/2024: Circumferential jt line Rt: 43.5 cm Lt:  43.5 cm  08/25/2023 Localized edema Rt knee  MUSCLE LENGTH: 08/25/2023 No specific testing.    POSTURE:  08/25/2023 Unremarkable   PALPATION: 08/25/2023 Mild tenderness around incision.   LOWER EXTREMITY ROM:   ROM Right Eval 08/25/2023 Left Eval 08/25/2023 Right 09/15/2023 Right  10/05/2023 Right 10/12/2023  Right 11/01/2023 Right  12/19/23 Right 01/24/2024 Right 03/21/2024  Knee flexion 60 AROM in supine heel slide with pain   P: 92 in supine with pain   AROM in supine heel slide 92 95 AROM in supine heel slide:  120 122  Knee extension -16 AROM in seated LAQ  -9 in supine heel prop  -5 in seated LAQ AROM Rt -4 in supine with heel prop 0 in heel prop 0 AROM in leg raise seated 0 AROM in seated LAQ: -5  PROM in supine heel prop:  1 deg hyperextension 0  Hamstrings         50/40  Quadriceps         115/110  Heel cords         3/0   (Blank rows = not tested)  LOWER EXTREMITY MMT:  MMT Right Eval 08/25/2023 Left Eval 08/25/2023 Right 09/19/2023 Right 12/19/23 Right 01/24/2024 Left 01/24/2024 Right 02/14/2024 Right  02/3124 Right 03/29/2024  Hip flexion 3+/5 5/5 5/5        Hip extension           Hip abduction           Hip adduction           Hip internal rotation           Hip external rotation           Knee flexion 4/5 5/5 4/5 4+       Knee extension NT 5/5 3+/5 4- 4+/5 24, 27 lbs 5/5 80, 81 lbs 23.4, 23.3 lbs  R 56 cm girth at 10 cm above patella L 55    4+/549, 46 lbs   Ankle dorsiflexion 5/5  5/5        Ankle plantarflexion           Ankle inversion           Ankle eversion            (Blank rows = not tested)  LOWER EXTREMITY SPECIAL TESTS:  08/25/2023 No specific testing.   FUNCTIONAL TESTS:  01/30/24  Rt SLS:  31  sec  08/25/2023 18 inch chair transfer: Unable unassist  Lt SLS: not tested   GAIT: 11/01/2023: Independent ambulation, no brace.  Deviations noted in stance on Rt leg, toe off progression.   09/22/2023: Single axillary crutch, brace 0-90 deg.   09/15/2023: One axillary crutch with brace 0-60 deg.   08/25/2023 Hinge brace 0-60  degrees with bilateral axillary crutches.  Step to pattern primarily with partial WB noted on Rt leg. Associated deviations due to brace locking at 60 deg (hip  circumduction, hike etc)  BFR Borderline cuff 4 or 5.  Selected 5 for assessment. LOP in supine 216 mmHg                                                                                                                                                                       TODAY'S TREATMENT      DATE:  04/04/2024 TherEx:  Recumbent bike level 5 for 6 minutes, started at seat 11 then moved to seat 10 Seated knee extension 3x10 with bilat up, Lt down with 10#  Standing single leg RDLs 1x10 with 10# KB, endorsing increased pain  PT discussed return to activity, typical recovery, and POC with patient   TherAct:  Attempted step ups and step downs with 6 step and 4 step, though patient noting increased pain and discomfort with stepping pattern in both directions  Unilat leg press 1x15, 1x10 with 50#, increased pain and soreness noted    TODAY'S TREATMENT      DATE:  03/29/2024 Therex: Recumbent bike lvl 5 6 mins for ROM, seat 11 Knee extension machine double leg up, single leg lowering 10 lbs 2 x 10 Incline gastroc stretch 30 sec x 3    TherActivity (to improve squatting, stairs) Decline ramp squat with focus on limited tibial anterior displacement x 10 - movement to tolerant level Lateral step down 4 inch step with slow lowering focus x 15 Leg press Rt leg only 50 lbs to fatigue x30  Neuro Re-ed Step to pattern high knee over 9 inch hurdle 10 ft x 5 leading with each LE Lateral stepping over 9 inch hurdle 10 ft x 5 each way with each LE leading     TODAY'S TREATMENT      DATE:  03/21/24  Rec bike Recumbent bike, seat 11 Lvl 4 8 mins for ROM Seated SLR 3x12 3# Seated heel slides Reassessment Stair assessment/training  03/01/2024 Prone quadriceps stretch 5 x 20 seconds with belt over opposite shoulder Supine hamstrings  stretch 5 x 20 seconds with belt and ankle dorsiflexion Upper heel cords stretch with slight toe in 5 x 30 seconds Knee extension machine 90 - 40, slow eccentrics 5#  2 sets of 10 Seated straight leg raises 2 sets of 10 with 2#  Functional Activities: Single Leg Press with full extension and slow eccentrics 2 sets of 15 with 50#  [id number]: Reviewed objective flexibility measures and updated HEP   TODAY'S TREATMENT      DATE:  02/14/2024 Therex: Recumbent bike, seat 11 Lvl 4 8 mins for ROM Knee extension machine double leg up, Rt leg lowering slowly 5 lbs 3 x 10  Decline ramp eccentric double leg squat focus to tolerance 2 x 10 .  Focus  on no anterior tibial translation.  Discussed use of TRX straps and a heel propr for loading at home. Seated quad set with SLR Rt x 15  Review of importance of loading in HEP with quad sets, SLR, and additional eccentric squat activity.   Manual Cross friction ice to patellar ligament Rt.  Education for use at home.    PATIENT EDUCATION:  09/22/2023 Education details: HEP update Person educated: Patient Education method: Programmer, Multimedia, Demonstration, Verbal cues, and Handouts Education comprehension: verbalized understanding, returned demonstration, and verbal cues required  HOME EXERCISE PROGRAM: Access Code: [email or web address] URL: [email or web address] Date: 03/01/2024 Prepared by: Lamar Ivory  Exercises - Supine Heel Slide  - 1-2 x daily - 7 x weekly - 1 sets - 10 reps - 5 hold - Supine Knee Extension Strengthening (Mirrored)  - 1-2 x daily - 7 x weekly - 1 sets - 10 reps - 5 hold - Prone Quadriceps Set  - 1-2 x daily - 7 x weekly - 1 sets - 10 reps - 5 hold - Seated Quad Set  - 3-5 x daily - 7 x weekly - 1 sets - 10 reps - 5 hold - Seated SLR  - 1-2 x daily - 7 x weekly - 1-2 sets - 10-15 reps - 2 hold - Braided Sidestepping  - 1 x daily - 7 x weekly - 4-5 sets - 5-7 reps - Tandem Stance  - 1 x daily - 7 x weekly - 4 sets - 2 reps - 30  seconds hold - Mini Squat with Counter Support and Chair Behind You  - 1 x daily - 5 x weekly - 2-3 sets - 10 reps - 3-5 seconds hold - Single Leg Squat with Chair Touch  - 1 x daily - 7 x weekly - 2-3 sets - 8-15 reps - Lunge Matrix on Slider  - 1 x daily - 5 x weekly - 2-3 sets - 5 reps - Supine Hamstring Stretch  - 2-3 x daily - 7 x weekly - 1 sets - 5 reps - 20 seconds hold - Prone Quadricep Stretch with Strap  - 2-3 x daily - 7 x weekly - 1 sets - 5 reps - 20 seconds hold - Slant Board Gastrocnemius Stretch  - 2-3 x daily - 7 x weekly - 1 sets - 5 reps - 20 seconds hold - Seated Straight Leg Raise   - 1 x daily - 7 x weekly - 10 sets - 5 reps - 2 hold  ASSESSMENT:  CLINICAL IMPRESSION:  Patient arrived to session noting increase in pain and difficulty with functional mobility (stairs and ambulation). Patient was limited this session secondary to increased pain and soreness so PT and patient decided to discontinue session early. Patient will continue to benefit from skilled PT.   OBJECTIVE IMPAIRMENTS: Abnormal gait, decreased activity tolerance, decreased balance, decreased coordination, decreased endurance, decreased mobility, difficulty walking, decreased ROM, decreased strength, hypomobility, increased edema, increased fascial restrictions, impaired perceived functional ability, increased muscle spasms, impaired flexibility, improper body mechanics, and pain.   ACTIVITY LIMITATIONS: carrying, lifting, bending, sitting, standing, squatting, sleeping, stairs, transfers, bed mobility, and locomotion level  PARTICIPATION LIMITATIONS: meal prep, cleaning, laundry, interpersonal relationship, driving, shopping, community activity, occupation, and yard work  PERSONAL FACTORS: Anxiety, HTN are also affecting patient's functional outcome.   REHAB POTENTIAL: Good  CLINICAL DECISION MAKING: Stable/uncomplicated  EVALUATION COMPLEXITY: Low   GOALS: Goals reviewed with patient? Yes  SHORT  TERM GOALS: (target date for Short term goals  are 3 weeks 09/15/2023)   1.  Patient will demonstrate independent use of home exercise program to maintain progress from in clinic treatments.  Goal status: Met 10/07/2023  LONG TERM GOALS: 05/09/2024 7 more weeks     1. Patient will demonstrate/report pain at worst less than or equal to 2/10 to facilitate minimal limitation in daily activity secondary to pain symptoms.  Goal status: Ongoing 03/21/24   2. Patient will demonstrate independent use of home exercise program to facilitate ability to maintain/progress functional gains from skilled physical therapy services.  Goal status: MET 03/21/24   3. Patient will demonstrate Patient specific functional scale avg > or = 8/10 to indicate reduced disability due to condition.   Goal status:  Ongoing 03/21/24  4.  Patient will demonstrate Rt LE MMT 5/5, dynamometry within 15 % of Lt for knee extension to faciltiate usual transfers, stairs, squatting at PLOF for daily life.   (No testing on eval for dynamometry due to surgical protocol)  Goal status:  Ongoing 03/21/24  5.  Patient will demonstrate independent ambulation > 500 ft for community integration at [facility].  Goal status: MET 12/19/23  6.  Patient will demonstrate ascending/descending stairs reciprocally s UE assist for community integration.   Goal status: Ongoing 03/21/24  7.  Patient will demonstrate SLS bilateral > 30 seconds for stability in ambualtion.  Goal Status: MET 01/30/24   PLAN:  PT FREQUENCY: 1-2x week  PT DURATION: 7 weeks (05/09/24)  PLANNED INTERVENTIONS: Can include 02853- PT Re-evaluation, 97110-Therapeutic exercises, 97530- Therapeutic activity, 97112- Neuromuscular re-education, 97535- Self Care, 97140- Manual therapy, [phone]- Gait training, [phone]- Orthotic Fit/training, [phone]- Canalith repositioning, [id number]- Aquatic Therapy, [phone]- Electrical stimulation (unattended), [phone]- Electrical stimulation (manual), [id number]  Physical performance testing, 97016- Vasopneumatic device, [id number]- Ultrasound, [id number]- Traction (mechanical), [id number]- Ionotophoresis 4mg /ml Dexamethasone , Patient/Family education, Balance training, Stair training, Taping, Dry Needling, Joint mobilization, Joint manipulation, Spinal manipulation, Spinal mobilization, Scar mobilization, Vestibular training, Visual/preceptual remediation/compensation, DME instructions, Cryotherapy, and Moist heat.  All performed as medically necessary.  All included unless contraindicated  PLAN FOR NEXT SESSION:Progress strength and eccentric tendon loading, quadriceps strength    Susannah Daring, PT, DPT 04/04/2024 12:15 PM        "

## 2024-04-04 ENCOUNTER — Ambulatory Visit

## 2024-04-04 DIAGNOSIS — R262 Difficulty in walking, not elsewhere classified: Secondary | ICD-10-CM | POA: Diagnosis not present

## 2024-04-04 DIAGNOSIS — M25561 Pain in right knee: Secondary | ICD-10-CM

## 2024-04-04 DIAGNOSIS — M6281 Muscle weakness (generalized): Secondary | ICD-10-CM

## 2024-04-04 DIAGNOSIS — M25661 Stiffness of right knee, not elsewhere classified: Secondary | ICD-10-CM

## 2024-04-04 DIAGNOSIS — R600 Localized edema: Secondary | ICD-10-CM | POA: Diagnosis not present

## 2024-04-06 ENCOUNTER — Encounter: Payer: Self-pay | Admitting: Family Medicine

## 2024-04-06 ENCOUNTER — Ambulatory Visit: Admitting: Family Medicine

## 2024-04-06 VITALS — BP 132/98 | HR 109 | Temp 98.0°F | Ht 72.5 in | Wt 294.2 lb

## 2024-04-06 DIAGNOSIS — E782 Mixed hyperlipidemia: Secondary | ICD-10-CM

## 2024-04-06 DIAGNOSIS — Z7984 Long term (current) use of oral hypoglycemic drugs: Secondary | ICD-10-CM

## 2024-04-06 DIAGNOSIS — E1159 Type 2 diabetes mellitus with other circulatory complications: Secondary | ICD-10-CM | POA: Diagnosis not present

## 2024-04-06 DIAGNOSIS — I152 Hypertension secondary to endocrine disorders: Secondary | ICD-10-CM | POA: Diagnosis not present

## 2024-04-06 MED ORDER — METFORMIN HCL ER 500 MG PO TB24: 500.0000 mg | ORAL_TABLET | Freq: Every day | ORAL | 11 refills | Status: AC

## 2024-04-06 MED ORDER — LISINOPRIL 20 MG PO TABS: 10.0000 mg | ORAL_TABLET | Freq: Every day | ORAL | 11 refills | Status: AC

## 2024-04-06 MED ORDER — ROSUVASTATIN CALCIUM 10 MG PO TABS: 10.0000 mg | ORAL_TABLET | Freq: Every day | ORAL | 11 refills | Status: AC

## 2024-04-06 NOTE — Patient Instructions (Signed)
Set up yearly eye exam for diabetes and have the opthalmologist send [place] a copy of the evaluation for the chart.

## 2024-04-06 NOTE — Assessment & Plan Note (Signed)
 Will check in 3 months, but statin indicated given diagnosis of diabetes.  Crestor  10 mg daily.

## 2024-04-06 NOTE — Progress Notes (Signed)
 "   Patient ID: Dale Fitzgerald, male    DOB: [birth year], 39 y.o.   MRN: [id number]  This visit was conducted in person.  BP (!) 132/98   Pulse (!) 109   Temp 98 F (36.7 C) (Temporal)   Ht 6' 0.5 (1.842 m)   Wt 294 lb 4 oz (133.5 kg)   SpO2 99%   BMI 39.36 kg/m    CC:  Chief Complaint  Patient presents with   Diabetes    New Dx    Subjective:   HPI: Dale Fitzgerald is a 39 y.o. male presenting on 04/06/2024 for Diabetes (New Dx)   New diagnosis diabetes Lab Results  Component Value Date   HGBA1C 8.2 (H) 03/28/2024    Wt Readings from Last 3 Encounters:  04/06/24 294 lb 4 oz (133.5 kg)  03/27/24 294 lb 6 oz (133.5 kg)  08/21/21 254 lb (115.2 kg)   Going to gym 3-4 times a week.  Has physical with labs prior scheduled with me in 3 months.    Hypertension:   Some improvement in control  with addition of lisinopril  10 mg daily.. not yet at goal.  BP Readings from Last 3 Encounters:  04/06/24 (!) 132/98  03/27/24 (!) 146/100  07/12/23 139/86  Using medication without problems or lightheadedness:  Chest pain with exertion: Edema: Short of breath: Average home BPs: Other issues:  Relevant past medical, surgical, family and social history reviewed and updated as indicated. Interim medical history since our last visit reviewed. Allergies and medications reviewed and updated. Outpatient Medications Prior to Visit  Medication Sig Dispense Refill   lisinopril  (ZESTRIL ) 10 MG tablet Take 1 tablet (10 mg total) by mouth daily. 30 tablet 11   apixaban  (ELIQUIS ) 2.5 MG TABS tablet Take one tab po bid x 30 days to prevent blood clots (Patient not taking: Reported on 04/06/2024) 60 tablet 0   rosuvastatin  (CRESTOR ) 10 MG tablet Take 10 mg by mouth at bedtime. (Patient not taking: Reported on 04/06/2024)     No facility-administered medications prior to visit.     Per HPI unless specifically indicated in ROS section below Review of Systems  Constitutional:  Negative for  fatigue and fever.  HENT:  Negative for ear pain.   Eyes:  Negative for pain.  Respiratory:  Negative for cough and shortness of breath.   Cardiovascular:  Negative for chest pain, palpitations and leg swelling.  Gastrointestinal:  Negative for abdominal pain.  Genitourinary:  Negative for dysuria.  Musculoskeletal:  Negative for arthralgias.  Neurological:  Negative for syncope, light-headedness and headaches.  Psychiatric/Behavioral:  Negative for dysphoric mood.    Objective:  BP (!) 132/98   Pulse (!) 109   Temp 98 F (36.7 C) (Temporal)   Ht 6' 0.5 (1.842 m)   Wt 294 lb 4 oz (133.5 kg)   SpO2 99%   BMI 39.36 kg/m   Wt Readings from Last 3 Encounters:  04/06/24 294 lb 4 oz (133.5 kg)  03/27/24 294 lb 6 oz (133.5 kg)  08/21/21 254 lb (115.2 kg)      Physical Exam Constitutional:      Appearance: He is well-developed. He is obese.  HENT:     Head: Normocephalic.     Right Ear: Hearing normal.     Left Ear: Hearing normal.     Nose: Nose normal.  Neck:     Thyroid: No thyroid mass or thyromegaly.     Vascular: No carotid bruit.  Trachea: Trachea normal.  Cardiovascular:     Rate and Rhythm: Normal rate and regular rhythm.     Pulses: Normal pulses.     Heart sounds: Heart sounds not distant. No murmur heard.    No friction rub. No gallop.     Comments: No peripheral edema Pulmonary:     Effort: Pulmonary effort is normal. No respiratory distress.     Breath sounds: Normal breath sounds.  Skin:    General: Skin is warm and dry.     Findings: No rash.  Psychiatric:        Speech: Speech normal.        Behavior: Behavior normal.        Thought Content: Thought content normal.       Results for orders placed or performed in visit on 03/28/24  Hemoglobin A1c   Collection Time: 03/28/24  3:16 PM  Result Value Ref Range   Hgb A1c MFr Bld 8.2 (H) 4.6 - 6.5 %    Assessment and Plan  Hypertension associated with diabetes (HCC) Assessment & Plan:   Chronic, improving but inadequate control on lisinopril  10 mg daily... increase to 20 mg daily.   Type 2 diabetes mellitus with other circulatory complications  (HTN) (HCC) Assessment & Plan: New diagnosis. Counseled on new diagnosis, disease progression/remission, diet and lifestyle changes and possible complications. Patient is not interested in a referral to nutritionist. He is not currently interested in checking his blood sugar with a glucometer or continuous glucose monitor. Will start metformin  XR 500 mg daily.  Return for repeat evaluation in 3 months.   Mixed hyperlipidemia Assessment & Plan: Will check in 3 months, but statin indicated given diagnosis of diabetes.  Crestor  10 mg daily.   Other orders -     metFORMIN  HCl ER; Take 1 tablet (500 mg total) by mouth daily with breakfast.  Dispense: 30 tablet; Refill: 11 -     Lisinopril ; Take 0.5 tablets (10 mg total) by mouth daily.  Dispense: 30 tablet; Refill: 11 -     Rosuvastatin  Calcium ; Take 1 tablet (10 mg total) by mouth at bedtime.  Dispense: 30 tablet; Refill: 11    No follow-ups on file.   Greig Ring, MD  "

## 2024-04-06 NOTE — Assessment & Plan Note (Signed)
"   Chronic, improving but inadequate control on lisinopril  10 mg daily... increase to 20 mg daily. "

## 2024-04-06 NOTE — Assessment & Plan Note (Signed)
 New diagnosis. Counseled on new diagnosis, disease progression/remission, diet and lifestyle changes and possible complications. Patient is not interested in a referral to nutritionist. He is not currently interested in checking his blood sugar with a glucometer or continuous glucose monitor. Will start metformin  XR 500 mg daily.  Return for repeat evaluation in 3 months.

## 2024-04-11 ENCOUNTER — Ambulatory Visit (INDEPENDENT_AMBULATORY_CARE_PROVIDER_SITE_OTHER)

## 2024-04-11 DIAGNOSIS — M6281 Muscle weakness (generalized): Secondary | ICD-10-CM | POA: Diagnosis not present

## 2024-04-11 DIAGNOSIS — R262 Difficulty in walking, not elsewhere classified: Secondary | ICD-10-CM

## 2024-04-11 DIAGNOSIS — M25661 Stiffness of right knee, not elsewhere classified: Secondary | ICD-10-CM

## 2024-04-11 DIAGNOSIS — M25561 Pain in right knee: Secondary | ICD-10-CM

## 2024-04-11 DIAGNOSIS — R600 Localized edema: Secondary | ICD-10-CM | POA: Diagnosis not present

## 2024-04-11 NOTE — Therapy (Signed)
 " OUTPATIENT PHYSICAL THERAPY TREATMENT    Patient Name: Dale Fitzgerald MRN: [id number] DOB:Apr 09, 1985, 39 y.o., male Today's Date: 04/11/2024  END OF SESSION:  PT End of Session - 04/11/24 1104     Visit Number 26    Number of Visits 54    Date for Recertification  05/09/24    Authorization Type [employer] $40 copay    PT Start Time 1104    PT Stop Time 1143    PT Time Calculation (min) 39 min    Activity Tolerance Patient tolerated treatment well    Behavior During Therapy [facility] for tasks assessed/performed            Past Medical History:  Diagnosis Date   Anxiety    Hypertension    Past Surgical History:  Procedure Laterality Date   APPENDECTOMY     Patient Active Problem List   Diagnosis Date Noted   Morbid obesity (HCC) 03/27/2024   Chronic low back pain 03/27/2024   History of tobacco use disorder 03/27/2024   History of marijuana use 03/27/2024   Patellar tendon rupture, right, initial encounter 07/12/2023   Mixed hyperlipidemia 09/01/2021   Hypertension associated with diabetes (HCC) 09/01/2021   Type 2 diabetes mellitus with other circulatory complications  (HTN) (HCC) 09/01/2021   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   REFERRING PROVIDER: Jule Ronal LITTIE DEVONNA  REFERRING DIAG: [phone] (ICD-10-CM) - Patellar tendon rupture, right, initial encounter  THERAPY DIAG:  Acute pain of right knee  Muscle weakness (generalized)  Stiffness of right knee, not elsewhere classified  Difficulty in walking, not elsewhere classified  Localized edema  Rationale for Evaluation and Treatment: Rehabilitation  ONSET DATE: Surgery 07/21/2023    SUBJECTIVE:   SUBJECTIVE STATEMENT:  Patient reports that he had one instance of knee buckling since last session.   PERTINENT HISTORY: Right patella tendon rupture 07/11/2023 with surgery 07/21/2023. Had been wearing hinge brace 0-30 degrees prior to  08/23/2023 PA visit.  Plan for follow up in 4 weeks for brace unlocking to 90 deg.  Currently set from 0-60.   PAIN:  NPRS scale: 3-4/10 at beginning of session, 5-6/10 after last session  Pain location: Rt knee anteriorly Pain description: achy Aggravating factors: Weight-bearing and end range for flexion Relieving factors: Rest, exercises  PRECAUTIONS: Knee - No in house protocol directly provided.  Respected protocol from [facility] in folder bin in clinic.  10/05/23: per Doctors orders, okay for out of brace PROM as tolerated   WEIGHT BEARING RESTRICTIONS: Brace 0-90 per PA visit on 09/20/2023   FALLS:  Has patient fallen in last 6 months? Yes. Number of falls 1 - current injury  LIVING ENVIRONMENT: Lives in: House/apartment Stairs: Flight of stairs in house.    Outside small step for porch without rail Has following equipment at home: Bilateral crutches, locking stairs.   OCCUPATION: Has worked 3 jobs. 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   PLOF: Independent, basketball recreationally, playing sports with son.    PATIENT GOALS: Reduce pain, get back to work/life. Get stronger  OBJECTIVE:   PATIENT SURVEYS:  Patient-Specific Activity Scoring Scheme  0 represents unable to perform. 10 represents able to perform at prior level. 0 1 2 3 4 5 6 7 8 9  10 (Date and Score)   Activity Eval  08/25/2023  11/01/2023 12/19/23 01/24/2024  01/30/24 03/21/24 04/11/2024  1. Walking independently  0 5  6 7 7 9 8   2. Stairs  4  4 4 5 5 5 5   3. work 0 4 0 0 0 0 0  4.Recreational sport 0 0 0 0 0 0 0  5.         Score 1 avg 2.75 avg 2.5 3.0 3.0 3.5 3.25   Total score = sum of the activity scores/number of activities Minimum detectable change (90%CI) for average score = 2 points Minimum detectable change (90%CI) for single activity score = 3 points  COGNITION: 08/25/2023 Overall cognitive status: WFL    SENSATION: 08/25/2023 WFL  EDEMA:   02/14/2024: Circumferential jt line Rt: 43.5 cm Lt:  43.5 cm  08/25/2023 Localized edema Rt knee  MUSCLE LENGTH: 08/25/2023 No specific testing.   POSTURE:  08/25/2023 Unremarkable   PALPATION: 08/25/2023 Mild tenderness around incision.   LOWER EXTREMITY ROM:   ROM Right Eval 08/25/2023 Left Eval 08/25/2023 Right 09/15/2023 Right  10/05/2023 Right 10/12/2023  Right 11/01/2023 Right  12/19/23 Right 01/24/2024 Right 03/21/2024  Knee flexion 60 AROM in supine heel slide with pain   P: 92 in supine with pain   AROM in supine heel slide 92 95 AROM in supine heel slide:  120 122  Knee extension -16 AROM in seated LAQ  -9 in supine heel prop  -5 in seated LAQ AROM Rt -4 in supine with heel prop 0 in heel prop 0 AROM in leg raise seated 0 AROM in seated LAQ: -5  PROM in supine heel prop:  1 deg hyperextension 0  Hamstrings         50/40  Quadriceps         115/110  Heel cords         3/0   (Blank rows = not tested)  LOWER EXTREMITY MMT:  MMT Right Eval 08/25/2023 Left Eval 08/25/2023 Right 09/19/2023 Right 12/19/23 Right 01/24/2024 Left 01/24/2024 Right 02/14/2024 Right  02/3124 Right 03/29/2024 04/11/2024  Hip flexion 3+/5 5/5 5/5         Hip extension            Hip abduction            Hip adduction            Hip internal rotation            Hip external rotation            Knee flexion 4/5 5/5 4/5 4+        Knee extension NT 5/5 3+/5 4- 4+/5 24, 27 lbs 5/5 80, 81 lbs 23.4, 23.3 lbs  R 56 cm girth at 10 cm above patella L 55    4+/549, 46 lbs  Rt: 53.9, 51.9 Lt: 110, 114  Ankle dorsiflexion 5/5  5/5         Ankle plantarflexion            Ankle inversion            Ankle eversion             (Blank rows = not tested)  LOWER EXTREMITY SPECIAL TESTS:  08/25/2023 No specific testing.   FUNCTIONAL TESTS:  01/30/24  Rt SLS:  31  sec  08/25/2023 18 inch chair transfer: Unable unassist  Lt SLS: not tested   GAIT: 11/01/2023: Independent ambulation, no brace.   Deviations noted in stance on Rt leg, toe off progression.   09/22/2023:  Single axillary crutch, brace 0-90 deg.   09/15/2023: One axillary crutch with brace 0-60 deg.   08/25/2023 Hinge brace 0-60 degrees with bilateral axillary crutches.  Step to pattern primarily with partial WB noted on Rt leg. Associated deviations due to brace locking at 60 deg (hip circumduction, hike etc)  BFR Borderline cuff 4 or 5.  Selected 5 for assessment. LOP in supine 216 mmHg                                                                                                                                                                       TODAY'S TREATMENT      DATE:  04/11/2024 TherEx:  Recumbent bike level 4 for 8 minutes seat 10 Seated knee extension 3x15 with bilat up, Lt down with 10#  Staggered stance sit<>stands to 24 table with Lt LE fwd 2x5  Reciprocal TRX lunges 3x10  Strength assessment with results noted above    TODAY'S TREATMENT      DATE:  04/04/2024 TherEx:  Recumbent bike level 5 for 6 minutes, started at seat 11 then moved to seat 10 Seated knee extension 3x10 with bilat up, Lt down with 10#  Standing single leg RDLs 1x10 with 10# KB, endorsing increased pain  PT discussed return to activity, typical recovery, and POC with patient   TherAct:  Attempted step ups and step downs with 6 step and 4 step, though patient noting increased pain and discomfort with stepping pattern in both directions  Unilat leg press 1x15, 1x10 with 50#, increased pain and soreness noted    TODAY'S TREATMENT      DATE:  03/29/2024 Therex: Recumbent bike lvl 5 6 mins for ROM, seat 11 Knee extension machine double leg up, single leg lowering 10 lbs 2 x 10 Incline gastroc stretch 30 sec x 3    TherActivity (to improve squatting, stairs) Decline ramp squat with focus on limited tibial anterior displacement x 10 - movement to tolerant level Lateral step down 4 inch step with slow lowering focus x 15 Leg  press Rt leg only 50 lbs to fatigue x30  Neuro Re-ed Step to pattern high knee over 9 inch hurdle 10 ft x 5 leading with each LE Lateral stepping over 9 inch hurdle 10 ft x 5 each way with each LE leading     TODAY'S TREATMENT      DATE:  03/21/24  Rec bike Recumbent bike, seat 11 Lvl 4 8 mins for ROM Seated SLR 3x12 3# Seated heel slides Reassessment Stair assessment/training  03/01/2024 Prone quadriceps stretch 5 x 20 seconds with belt over opposite shoulder Supine hamstrings stretch 5 x 20 seconds with belt and ankle dorsiflexion Upper heel cords stretch with slight toe in 5  x 30 seconds Knee extension machine 90 - 40, slow eccentrics 5#  2 sets of 10 Seated straight leg raises 2 sets of 10 with 2#  Functional Activities: Single Leg Press with full extension and slow eccentrics 2 sets of 15 with 50#  [id number]: Reviewed objective flexibility measures and updated HEP   TODAY'S TREATMENT      DATE:  02/14/2024 Therex: Recumbent bike, seat 11 Lvl 4 8 mins for ROM Knee extension machine double leg up, Rt leg lowering slowly 5 lbs 3 x 10  Decline ramp eccentric double leg squat focus to tolerance 2 x 10 .  Focus on no anterior tibial translation.  Discussed use of TRX straps and a heel propr for loading at home. Seated quad set with SLR Rt x 15  Review of importance of loading in HEP with quad sets, SLR, and additional eccentric squat activity.   Manual Cross friction ice to patellar ligament Rt.  Education for use at home.    PATIENT EDUCATION:  09/22/2023 Education details: HEP update Person educated: Patient Education method: Programmer, Multimedia, Demonstration, Verbal cues, and Handouts Education comprehension: verbalized understanding, returned demonstration, and verbal cues required  HOME EXERCISE PROGRAM: Access Code: [email or web address] URL: [email or web address] Date: 03/01/2024 Prepared by: Lamar Ivory  Exercises - Supine Heel Slide  - 1-2 x daily - 7 x weekly - 1  sets - 10 reps - 5 hold - Supine Knee Extension Strengthening (Mirrored)  - 1-2 x daily - 7 x weekly - 1 sets - 10 reps - 5 hold - Prone Quadriceps Set  - 1-2 x daily - 7 x weekly - 1 sets - 10 reps - 5 hold - Seated Quad Set  - 3-5 x daily - 7 x weekly - 1 sets - 10 reps - 5 hold - Seated SLR  - 1-2 x daily - 7 x weekly - 1-2 sets - 10-15 reps - 2 hold - Braided Sidestepping  - 1 x daily - 7 x weekly - 4-5 sets - 5-7 reps - Tandem Stance  - 1 x daily - 7 x weekly - 4 sets - 2 reps - 30 seconds hold - Mini Squat with Counter Support and Chair Behind You  - 1 x daily - 5 x weekly - 2-3 sets - 10 reps - 3-5 seconds hold - Single Leg Squat with Chair Touch  - 1 x daily - 7 x weekly - 2-3 sets - 8-15 reps - Lunge Matrix on Slider  - 1 x daily - 5 x weekly - 2-3 sets - 5 reps - Supine Hamstring Stretch  - 2-3 x daily - 7 x weekly - 1 sets - 5 reps - 20 seconds hold - Prone Quadricep Stretch with Strap  - 2-3 x daily - 7 x weekly - 1 sets - 5 reps - 20 seconds hold - Slant Board Gastrocnemius Stretch  - 2-3 x daily - 7 x weekly - 1 sets - 5 reps - 20 seconds hold - Seated Straight Leg Raise   - 1 x daily - 7 x weekly - 10 sets - 5 reps - 2 hold  ASSESSMENT:  CLINICAL IMPRESSION:  Patient arrived to session noting no new change in symptoms with only one instance of knee buckling due to increase ambulation. Patient tolerated all activities this date, though continues to have a deficit of Rt LE strength compared to Lt. Patient required several seated rest breaks throughout session secondary to increased fatigue. Patient will continue to benefit from  skilled PT.   OBJECTIVE IMPAIRMENTS: Abnormal gait, decreased activity tolerance, decreased balance, decreased coordination, decreased endurance, decreased mobility, difficulty walking, decreased ROM, decreased strength, hypomobility, increased edema, increased fascial restrictions, impaired perceived functional ability, increased muscle spasms, impaired  flexibility, improper body mechanics, and pain.   ACTIVITY LIMITATIONS: carrying, lifting, bending, sitting, standing, squatting, sleeping, stairs, transfers, bed mobility, and locomotion level  PARTICIPATION LIMITATIONS: meal prep, cleaning, laundry, interpersonal relationship, driving, shopping, community activity, occupation, and yard work  PERSONAL FACTORS: Anxiety, HTN are also affecting patient's functional outcome.   REHAB POTENTIAL: Good  CLINICAL DECISION MAKING: Stable/uncomplicated  EVALUATION COMPLEXITY: Low   GOALS: Goals reviewed with patient? Yes  SHORT TERM GOALS: (target date for Short term goals are 3 weeks 09/15/2023)   1.  Patient will demonstrate independent use of home exercise program to maintain progress from in clinic treatments.  Goal status: Met 10/07/2023  LONG TERM GOALS: 05/09/2024 7 more weeks     1. Patient will demonstrate/report pain at worst less than or equal to 2/10 to facilitate minimal limitation in daily activity secondary to pain symptoms.  Goal status: goal ongoing, 04/11/2024   2. Patient will demonstrate independent use of home exercise program to facilitate ability to maintain/progress functional gains from skilled physical therapy services.  Goal status: MET 03/21/24   3. Patient will demonstrate Patient specific functional scale avg > or = 8/10 to indicate reduced disability due to condition.   Goal status:  goal ongoing, 04/11/2024  4.  Patient will demonstrate Rt LE MMT 5/5, dynamometry within 15 % of Lt for knee extension to faciltiate usual transfers, stairs, squatting at PLOF for daily life.   (No testing on eval for dynamometry due to surgical protocol)  Goal status:  goal ongoingt, 04/11/2024  5.  Patient will demonstrate independent ambulation > 500 ft for community integration at [facility].  Goal status: MET 12/19/23  6.  Patient will demonstrate ascending/descending stairs reciprocally s UE assist for community integration.    Goal status: goal met, 04/11/2024  7.  Patient will demonstrate SLS bilateral > 30 seconds for stability in ambualtion.  Goal Status: MET 01/30/24   PLAN:  PT FREQUENCY: 1-2x week  PT DURATION: 7 weeks (05/09/24)  PLANNED INTERVENTIONS: Can include 02853- PT Re-evaluation, 97110-Therapeutic exercises, 97530- Therapeutic activity, 97112- Neuromuscular re-education, 97535- Self Care, 97140- Manual therapy, [phone]- Gait training, [phone]- Orthotic Fit/training, [phone]- Canalith repositioning, [id number]- Aquatic Therapy, [phone]- Electrical stimulation (unattended), [phone]- Electrical stimulation (manual), [id number] Physical performance testing, 97016- Vasopneumatic device, [id number]- Ultrasound, [id number]- Traction (mechanical), [id number]- Ionotophoresis 4mg /ml Dexamethasone , Patient/Family education, Balance training, Stair training, Taping, Dry Needling, Joint mobilization, Joint manipulation, Spinal manipulation, Spinal mobilization, Scar mobilization, Vestibular training, Visual/preceptual remediation/compensation, DME instructions, Cryotherapy, and Moist heat.  All performed as medically necessary.  All included unless contraindicated  PLAN FOR NEXT SESSION: Rt LE strengthening, motor control, and balance    Susannah Daring, PT, DPT 04/11/24 12:47 PM        "

## 2024-05-08 ENCOUNTER — Ambulatory Visit: Admitting: Orthopaedic Surgery

## 2024-06-19 ENCOUNTER — Other Ambulatory Visit

## 2024-06-26 ENCOUNTER — Encounter: Admitting: Family Medicine
